# Patient Record
Sex: Male | Born: 1975 | Race: White | Hispanic: No | Marital: Single | Smoking: Current every day smoker
Health system: Southern US, Community
[De-identification: ages and names within clinical notes are randomized; demographics above are authoritative.]

## PROBLEM LIST (undated history)

## (undated) ENCOUNTER — Telehealth

## (undated) ENCOUNTER — Encounter

## (undated) ENCOUNTER — Ambulatory Visit

## (undated) ENCOUNTER — Encounter
Attending: Student in an Organized Health Care Education/Training Program | Primary: Student in an Organized Health Care Education/Training Program

## (undated) ENCOUNTER — Ambulatory Visit: Payer: MEDICARE

## (undated) ENCOUNTER — Encounter: Attending: Psychiatric/Mental Health | Primary: Psychiatric/Mental Health

## (undated) ENCOUNTER — Ambulatory Visit
Attending: Student in an Organized Health Care Education/Training Program | Primary: Student in an Organized Health Care Education/Training Program

## (undated) ENCOUNTER — Telehealth: Attending: Infectious Disease | Primary: Infectious Disease

## (undated) ENCOUNTER — Telehealth: Attending: Clinical | Primary: Clinical

## (undated) ENCOUNTER — Telehealth
Attending: Student in an Organized Health Care Education/Training Program | Primary: Student in an Organized Health Care Education/Training Program

## (undated) DIAGNOSIS — F419 Anxiety disorder, unspecified: Secondary | ICD-10-CM

## (undated) DIAGNOSIS — F32A Depression, unspecified: Secondary | ICD-10-CM

## (undated) DIAGNOSIS — F39 Unspecified mood [affective] disorder: Secondary | ICD-10-CM

## (undated) DIAGNOSIS — F329 Major depressive disorder, single episode, unspecified: Secondary | ICD-10-CM

## (undated) DIAGNOSIS — J189 Pneumonia, unspecified organism: Secondary | ICD-10-CM

## (undated) DIAGNOSIS — B20 Human immunodeficiency virus [HIV] disease: Secondary | ICD-10-CM

## (undated) HISTORY — PX: BACK SURGERY: SHX140

## (undated) HISTORY — PX: TOTAL HIP ARTHROPLASTY: SHX124

---
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
## 1997-08-27 ENCOUNTER — Emergency Department (HOSPITAL_COMMUNITY): Admission: EM | Admit: 1997-08-27 | Discharge: 1997-08-27 | Payer: Self-pay | Admitting: *Deleted

## 2002-01-24 ENCOUNTER — Other Ambulatory Visit: Admission: RE | Admit: 2002-01-24 | Discharge: 2002-01-24 | Payer: Self-pay | Admitting: Internal Medicine

## 2002-10-18 ENCOUNTER — Emergency Department (HOSPITAL_COMMUNITY): Admission: EM | Admit: 2002-10-18 | Discharge: 2002-10-18 | Payer: Self-pay | Admitting: Emergency Medicine

## 2002-11-10 ENCOUNTER — Emergency Department (HOSPITAL_COMMUNITY): Admission: EM | Admit: 2002-11-10 | Discharge: 2002-11-10 | Payer: Self-pay

## 2002-12-07 ENCOUNTER — Emergency Department (HOSPITAL_COMMUNITY): Admission: EM | Admit: 2002-12-07 | Discharge: 2002-12-07 | Payer: Self-pay | Admitting: Emergency Medicine

## 2004-01-24 ENCOUNTER — Ambulatory Visit: Payer: Self-pay | Admitting: Internal Medicine

## 2004-04-09 ENCOUNTER — Emergency Department: Payer: Self-pay | Admitting: Emergency Medicine

## 2004-04-13 ENCOUNTER — Emergency Department: Payer: Self-pay | Admitting: Emergency Medicine

## 2004-04-15 ENCOUNTER — Emergency Department: Payer: Self-pay | Admitting: Emergency Medicine

## 2004-05-08 ENCOUNTER — Emergency Department: Payer: Self-pay | Admitting: Internal Medicine

## 2004-05-29 ENCOUNTER — Ambulatory Visit: Payer: Self-pay | Admitting: Infectious Diseases

## 2004-07-09 ENCOUNTER — Emergency Department (HOSPITAL_COMMUNITY): Admission: EM | Admit: 2004-07-09 | Discharge: 2004-07-09 | Payer: Self-pay | Admitting: Emergency Medicine

## 2004-12-15 ENCOUNTER — Emergency Department: Payer: Self-pay | Admitting: Emergency Medicine

## 2005-09-25 ENCOUNTER — Ambulatory Visit: Payer: Self-pay | Admitting: Infectious Diseases

## 2005-09-29 ENCOUNTER — Emergency Department: Payer: Self-pay | Admitting: Emergency Medicine

## 2005-10-29 ENCOUNTER — Ambulatory Visit: Payer: Self-pay | Admitting: Infectious Diseases

## 2005-11-22 ENCOUNTER — Emergency Department: Payer: Self-pay | Admitting: Emergency Medicine

## 2005-12-02 ENCOUNTER — Inpatient Hospital Stay: Payer: Self-pay | Admitting: Internal Medicine

## 2006-01-07 ENCOUNTER — Ambulatory Visit: Payer: Self-pay | Admitting: Infectious Diseases

## 2006-06-16 ENCOUNTER — Emergency Department: Payer: Self-pay | Admitting: Emergency Medicine

## 2006-07-13 ENCOUNTER — Emergency Department: Payer: Self-pay | Admitting: Unknown Physician Specialty

## 2006-08-26 ENCOUNTER — Ambulatory Visit: Payer: Self-pay | Admitting: Internal Medicine

## 2006-10-28 ENCOUNTER — Ambulatory Visit: Payer: Self-pay | Admitting: Infectious Diseases

## 2007-04-07 ENCOUNTER — Ambulatory Visit: Payer: Self-pay | Admitting: Infectious Diseases

## 2007-05-26 ENCOUNTER — Ambulatory Visit: Payer: Self-pay | Admitting: Infectious Diseases

## 2007-05-29 ENCOUNTER — Emergency Department: Payer: Self-pay | Admitting: Emergency Medicine

## 2007-06-30 ENCOUNTER — Emergency Department: Payer: Self-pay | Admitting: Emergency Medicine

## 2007-07-22 ENCOUNTER — Ambulatory Visit: Payer: Self-pay | Admitting: Internal Medicine

## 2007-09-08 ENCOUNTER — Ambulatory Visit: Payer: Self-pay | Admitting: Infectious Diseases

## 2008-03-11 ENCOUNTER — Emergency Department: Payer: Self-pay | Admitting: Emergency Medicine

## 2008-06-14 ENCOUNTER — Ambulatory Visit: Payer: Self-pay | Admitting: Internal Medicine

## 2008-10-30 ENCOUNTER — Ambulatory Visit (HOSPITAL_COMMUNITY): Admission: RE | Admit: 2008-10-30 | Discharge: 2008-10-30 | Payer: Self-pay | Admitting: Neurosurgery

## 2010-03-27 ENCOUNTER — Ambulatory Visit: Payer: Self-pay | Admitting: Infectious Diseases

## 2010-08-25 LAB — CBC
Hemoglobin: 16.4 g/dL (ref 13.0–17.0)
MCHC: 35.3 g/dL (ref 30.0–36.0)
RBC: 4.97 MIL/uL (ref 4.22–5.81)
WBC: 8.4 10*3/uL (ref 4.0–10.5)

## 2010-08-25 LAB — BASIC METABOLIC PANEL
CO2: 28 mEq/L (ref 19–32)
Calcium: 9.7 mg/dL (ref 8.4–10.5)
Creatinine, Ser: 0.91 mg/dL (ref 0.4–1.5)
GFR calc Af Amer: >60 mL/min (ref >60)
GFR calc non Af Amer: >60 mL/min (ref >60)
Sodium: 136 mEq/L (ref 135–145)

## 2010-08-25 LAB — TYPE AND SCREEN: Antibody Screen: NEGATIVE

## 2010-08-25 LAB — DIFFERENTIAL
Lymphocytes Relative: 36 % (ref 12–46)
Lymphs Abs: 3 10*3/uL (ref 0.7–4.0)
Monocytes Absolute: 0.6 10*3/uL (ref 0.1–1.0)
Monocytes Relative: 7 % (ref 3–12)
Neutro Abs: 4.7 10*3/uL (ref 1.7–7.7)
Neutrophils Relative %: 55 % (ref 43–77)

## 2010-08-25 LAB — ABO/RH: ABO/RH(D): A POS

## 2010-09-18 ENCOUNTER — Emergency Department: Payer: Self-pay | Admitting: Emergency Medicine

## 2010-09-24 ENCOUNTER — Emergency Department: Payer: Self-pay | Admitting: Unknown Physician Specialty

## 2010-09-27 ENCOUNTER — Emergency Department: Payer: Self-pay | Admitting: Emergency Medicine

## 2010-09-30 NOTE — Op Note (Signed)
Mason Meadows, Mason Meadows           ACCOUNT NO.:  [id number]   MEDICAL RECORD NO.:  [id number]          PATIENT TYPE:  OIB   LOCATION:  3535                         FACILITY:  [facility]   PHYSICIAN:  Henry A. Pool, M.D.    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   PROCEDURE NAME:  Left L4-5 laminotomy and microdiskectomy.   SURGEON:  Kathaleen Maser. Pool, MD   ASSISTANT:  Reinaldo Meeker, MD   ANESTHESIA:  General endotracheal.   INDICATIONS:  [name] is a 35 year old male with history of back and  left lower extremity pain failing conservative management.  Workup  demonstrates evidence of spondylosis, epidural lipomatosis, and a left-  sided L4-5 disk herniation, all causing moderately severe stenosis and  compression of left-sided L5 nerve root.  The patient presents now for  decompression and microdiskectomy in hopes of improving his symptoms.   OPERATIVE NOTE:  The patient was taken to the operating room and placed  on the operating table in a supine position.  After adequate level of  anesthesia was achieved, the patient was positioned prone onto Wilson  frame and appropriately padded.  The patient's lumbar region was  prepped and draped sterilely.  A 10-blade was used to make a curvilinear  skin incision overlying the L4-L5 interspace.  This was carried down  sharply in the midline.  A subperiosteal dissection was then performed  exposing the lamina and facet joints at L4 and L5.  Deep self-retaining  retractor was placed.  Intraoperative x-rays were taken and level was  confirmed.  Laminotomy was then performed using high-speed drill and  Kerrison rongeurs to remove the inferior aspect of the lamina of  L4,  medial aspect of L4-L5 facet joints, superior rim of the L5 lamina.  Ligament flavum was then elevated and resected in a piecemeal fashion  using Kerrison rongeurs.  Copious amounts of dorsal epidural fat were  encountered and resected.  Microscope was then brought to the field for  microdissection of the right side L5 nerve root underlying disk  herniation.  Epidural venous plexus coagulated and cut.  Thecal sac and  L5 nerve root were gently mobilized and retracted towards midline.  Disk  herniation was readily apparent.  This was then incised with a 15-blade.  The disk fragment was dissected free using a blunt nerve hook.  The  remainder of the disk space was flat without evidence of major  disruption.  For that reason, no intradiskal diskectomy was undertaken.  The wound was then irrigated with  antibiotic solution.  Gelfoam was placed topically for hemostasis, which  was found to be good.  Wound was then closed in typical fashion.  Steri-  Strips and sterile dressings were applied.  There were no apparent  complications.  The patient tolerated the procedure well, and he returns  to the recovery room postoperatively.  ______________________________  Kathaleen Maser Pool, M.D.     HAP/MEDQ  D:  10/30/2008  T:  10/31/2008  Job:  [id number]

## 2010-10-08 ENCOUNTER — Emergency Department: Payer: Self-pay | Admitting: Unknown Physician Specialty

## 2010-10-12 ENCOUNTER — Emergency Department: Payer: Self-pay | Admitting: Emergency Medicine

## 2010-10-28 ENCOUNTER — Emergency Department: Payer: Self-pay | Admitting: Emergency Medicine

## 2010-11-23 ENCOUNTER — Emergency Department: Payer: Self-pay | Admitting: Emergency Medicine

## 2010-12-13 ENCOUNTER — Emergency Department: Payer: Self-pay | Admitting: Internal Medicine

## 2010-12-19 ENCOUNTER — Emergency Department: Payer: Self-pay | Admitting: Emergency Medicine

## 2010-12-23 ENCOUNTER — Emergency Department: Payer: Self-pay | Admitting: Emergency Medicine

## 2011-01-01 ENCOUNTER — Ambulatory Visit: Payer: Self-pay | Admitting: Orthopedic Surgery

## 2011-01-06 ENCOUNTER — Inpatient Hospital Stay: Payer: Self-pay | Admitting: Orthopedic Surgery

## 2011-01-09 LAB — PATHOLOGY REPORT

## 2011-01-22 ENCOUNTER — Emergency Department: Payer: Self-pay | Admitting: Internal Medicine

## 2011-01-25 ENCOUNTER — Emergency Department: Payer: Self-pay | Admitting: Emergency Medicine

## 2011-01-27 ENCOUNTER — Emergency Department: Payer: Self-pay

## 2011-02-01 ENCOUNTER — Emergency Department: Payer: Self-pay | Admitting: *Deleted

## 2011-02-05 ENCOUNTER — Emergency Department: Payer: Self-pay | Admitting: Emergency Medicine

## 2011-02-15 ENCOUNTER — Emergency Department: Payer: Self-pay | Admitting: Internal Medicine

## 2011-02-20 ENCOUNTER — Emergency Department: Payer: Self-pay | Admitting: *Deleted

## 2011-02-23 ENCOUNTER — Emergency Department: Payer: Self-pay | Admitting: Emergency Medicine

## 2011-03-01 ENCOUNTER — Emergency Department: Payer: Self-pay | Admitting: Emergency Medicine

## 2011-03-05 ENCOUNTER — Emergency Department: Payer: Self-pay | Admitting: Emergency Medicine

## 2011-03-18 ENCOUNTER — Emergency Department: Payer: Self-pay | Admitting: Emergency Medicine

## 2011-04-08 ENCOUNTER — Emergency Department: Payer: Self-pay | Admitting: *Deleted

## 2011-04-09 ENCOUNTER — Emergency Department: Payer: Self-pay | Admitting: Emergency Medicine

## 2011-04-09 ENCOUNTER — Emergency Department: Payer: Self-pay | Admitting: *Deleted

## 2011-04-10 ENCOUNTER — Emergency Department: Payer: Self-pay | Admitting: Emergency Medicine

## 2011-04-28 ENCOUNTER — Ambulatory Visit: Payer: Self-pay | Admitting: Specialist

## 2011-05-29 ENCOUNTER — Emergency Department: Payer: Self-pay | Admitting: Emergency Medicine

## 2011-06-11 ENCOUNTER — Emergency Department: Payer: Self-pay | Admitting: Unknown Physician Specialty

## 2011-06-24 ENCOUNTER — Emergency Department: Payer: Self-pay | Admitting: Unknown Physician Specialty

## 2011-07-01 ENCOUNTER — Emergency Department: Payer: Self-pay | Admitting: Emergency Medicine

## 2011-07-01 LAB — URINALYSIS, COMPLETE
Bacteria: NONE SEEN
Bilirubin,UR: NEGATIVE
Ketone: NEGATIVE
Ph: 7 (ref 4.5–8.0)
Protein: NEGATIVE
RBC,UR: 12 /HPF (ref 0–5)
Specific Gravity: 1.005 (ref 1.003–1.030)
Squamous Epithelial: <1
WBC UR: 52 /HPF (ref 0–5)

## 2011-07-30 ENCOUNTER — Emergency Department: Payer: Self-pay | Admitting: Emergency Medicine

## 2011-07-30 LAB — URINALYSIS, COMPLETE
Bilirubin,UR: NEGATIVE
Blood: NEGATIVE
Glucose,UR: NEGATIVE mg/dL (ref 0–75)
Hyaline Cast: 1
Nitrite: NEGATIVE
Protein: NEGATIVE
Specific Gravity: 1.019 (ref 1.003–1.030)
Squamous Epithelial: 3

## 2011-07-31 ENCOUNTER — Emergency Department: Payer: Self-pay | Admitting: Emergency Medicine

## 2011-12-22 ENCOUNTER — Emergency Department: Payer: Self-pay | Admitting: Emergency Medicine

## 2012-01-24 ENCOUNTER — Emergency Department: Payer: Self-pay | Admitting: Emergency Medicine

## 2012-10-24 ENCOUNTER — Emergency Department: Payer: Self-pay | Admitting: Emergency Medicine

## 2013-04-11 ENCOUNTER — Emergency Department: Payer: Self-pay | Admitting: Emergency Medicine

## 2013-05-30 ENCOUNTER — Emergency Department: Payer: Self-pay | Admitting: Emergency Medicine

## 2013-05-30 LAB — CBC
HCT: 45.6 % (ref 40.0–52.0)
HGB: 15.5 g/dL (ref 13.0–18.0)
MCH: 29.1 pg (ref 26.0–34.0)
MCHC: 34 g/dL (ref 32.0–36.0)
MCV: 86 fL (ref 80–100)
Platelet: 421 10*3/uL (ref 150–440)
RBC: 5.33 10*6/uL (ref 4.40–5.90)
RDW: 13.8 % (ref 11.5–14.5)
WBC: 11.1 10*3/uL — AB (ref 3.8–10.6)

## 2013-05-30 LAB — BASIC METABOLIC PANEL
Anion Gap: 11 (ref 7–16)
BUN: 14 mg/dL (ref 7–18)
CALCIUM: 8.4 mg/dL — AB (ref 8.5–10.1)
Chloride: 101 mmol/L (ref 98–107)
Co2: 20 mmol/L — ABNORMAL LOW (ref 21–32)
Creatinine: 0.86 mg/dL (ref 0.60–1.30)
EGFR (African American): >60
EGFR (Non-African Amer.): >60
Glucose: 87 mg/dL (ref 65–99)
Osmolality: 264 (ref 275–301)
Potassium: 4 mmol/L (ref 3.5–5.1)
Sodium: 132 mmol/L — ABNORMAL LOW (ref 136–145)

## 2013-05-30 LAB — RAPID INFLUENZA A&B ANTIGENS

## 2013-05-31 ENCOUNTER — Inpatient Hospital Stay (HOSPITAL_COMMUNITY)
Admit: 2013-05-31 | Discharge: 2013-06-02 | DRG: 974 | Disposition: A | Discharge status: Other Acute Inpatient Hospital | Payer: Medicare Other | Source: Other Acute Inpatient Hospital | Attending: Internal Medicine | Admitting: Internal Medicine

## 2013-05-31 ENCOUNTER — Encounter (HOSPITAL_COMMUNITY): Payer: Self-pay | Admitting: *Deleted

## 2013-05-31 DIAGNOSIS — Z87891 Personal history of nicotine dependence: Secondary | ICD-10-CM

## 2013-05-31 DIAGNOSIS — Z23 Encounter for immunization: Secondary | ICD-10-CM

## 2013-05-31 DIAGNOSIS — F410 Panic disorder [episodic paroxysmal anxiety] without agoraphobia: Secondary | ICD-10-CM | POA: Diagnosis present

## 2013-05-31 DIAGNOSIS — Z91199 Patient's noncompliance with other medical treatment and regimen due to unspecified reason: Secondary | ICD-10-CM

## 2013-05-31 DIAGNOSIS — Z9119 Patient's noncompliance with other medical treatment and regimen: Secondary | ICD-10-CM

## 2013-05-31 DIAGNOSIS — J690 Pneumonitis due to inhalation of food and vomit: Secondary | ICD-10-CM | POA: Diagnosis present

## 2013-05-31 DIAGNOSIS — B59 Pneumocystosis: Secondary | ICD-10-CM | POA: Diagnosis present

## 2013-05-31 DIAGNOSIS — J9600 Acute respiratory failure, unspecified whether with hypoxia or hypercapnia: Secondary | ICD-10-CM | POA: Diagnosis present

## 2013-05-31 DIAGNOSIS — Z96649 Presence of unspecified artificial hip joint: Secondary | ICD-10-CM

## 2013-05-31 DIAGNOSIS — E43 Unspecified severe protein-calorie malnutrition: Secondary | ICD-10-CM | POA: Diagnosis present

## 2013-05-31 DIAGNOSIS — IMO0002 Reserved for concepts with insufficient information to code with codable children: Secondary | ICD-10-CM

## 2013-05-31 DIAGNOSIS — B20 Human immunodeficiency virus [HIV] disease: Principal | ICD-10-CM | POA: Diagnosis present

## 2013-05-31 DIAGNOSIS — J9601 Acute respiratory failure with hypoxia: Secondary | ICD-10-CM | POA: Diagnosis present

## 2013-05-31 DIAGNOSIS — J189 Pneumonia, unspecified organism: Secondary | ICD-10-CM | POA: Diagnosis present

## 2013-05-31 HISTORY — DX: Anxiety disorder, unspecified: F41.9

## 2013-05-31 HISTORY — DX: Pneumonia, unspecified organism: J18.9

## 2013-05-31 LAB — EXPECTORATED SPUTUM ASSESSMENT W GRAM STAIN, RFLX TO RESP C

## 2013-05-31 LAB — BLOOD GAS, ARTERIAL
Acid-base deficit: 3.3 mmol/L — ABNORMAL HIGH (ref 0.0–2.0)
Bicarbonate: 20.2 mEq/L (ref 20.0–24.0)
Drawn by: 325261
FIO2: 1 %
O2 Saturation: 95.7 %
PCO2 ART: 30.3 mmHg — AB (ref 35.0–45.0)
PO2 ART: 82.9 mmHg (ref 80.0–100.0)
Patient temperature: 98.6
TCO2: 21.1 mmol/L (ref 0–100)
pH, Arterial: 7.438 (ref 7.350–7.450)

## 2013-05-31 LAB — PNEUMOCYSTIS JIROVECI SMEAR BY DFA: Pneumocystis jiroveci Ag: NEGATIVE

## 2013-05-31 LAB — CBC
HCT: 38.8 % — ABNORMAL LOW (ref 39.0–52.0)
Hemoglobin: 13.9 g/dL (ref 13.0–17.0)
MCH: 30 pg (ref 26.0–34.0)
MCHC: 35.8 g/dL (ref 30.0–36.0)
MCV: 83.6 fL (ref 78.0–100.0)
PLATELETS: 364 10*3/uL (ref 150–400)
RBC: 4.64 MIL/uL (ref 4.22–5.81)
RDW: 13.3 % (ref 11.5–15.5)
WBC: 6 10*3/uL (ref 4.0–10.5)

## 2013-05-31 LAB — CREATININE, SERUM
Creatinine, Ser: 0.73 mg/dL (ref 0.50–1.35)
GFR calc Af Amer: >90 mL/min (ref >90)

## 2013-05-31 LAB — MRSA PCR SCREENING: MRSA by PCR: NEGATIVE

## 2013-05-31 MED ORDER — SODIUM CHLORIDE 0.45 % IV SOLN
INTRAVENOUS | Status: DC
  Administered 2013-05-31 – 2013-06-02 (×4): via INTRAVENOUS

## 2013-05-31 MED ORDER — LEVOFLOXACIN IN D5W 750 MG/150ML IV SOLN
750.0000 mg | INTRAVENOUS | Status: DC
  Administered 2013-05-31 – 2013-06-01 (×2): 750 mg via INTRAVENOUS
  Filled 2013-05-31 (×3): qty 150

## 2013-05-31 MED ORDER — ONDANSETRON HCL 4 MG/2ML IJ SOLN: 4.0000 mg | Freq: Four times a day (QID) | INTRAMUSCULAR | Status: DC | PRN

## 2013-05-31 MED ORDER — ONDANSETRON HCL 4 MG PO TABS: 4.0000 mg | ORAL_TABLET | Freq: Four times a day (QID) | ORAL | Status: DC | PRN

## 2013-05-31 MED ORDER — SULFAMETHOXAZOLE-TRIMETHOPRIM 400-80 MG/5ML IV SOLN
160.0000 mg | Freq: Three times a day (TID) | INTRAVENOUS | Status: DC
  Administered 2013-05-31: 160 mg via INTRAVENOUS
  Filled 2013-05-31 (×2): qty 10

## 2013-05-31 MED ORDER — IPRATROPIUM-ALBUTEROL 0.5-2.5 (3) MG/3ML IN SOLN
3.0000 mL | Freq: Four times a day (QID) | RESPIRATORY_TRACT | Status: DC
  Administered 2013-05-31 (×3): 3 mL via RESPIRATORY_TRACT
  Filled 2013-05-31 (×3): qty 3

## 2013-05-31 MED ORDER — SULFAMETHOXAZOLE-TRIMETHOPRIM 400-80 MG/5ML IV SOLN
320.0000 mg | Freq: Three times a day (TID) | INTRAVENOUS | Status: DC
  Administered 2013-05-31 – 2013-06-02 (×6): 320 mg via INTRAVENOUS
  Filled 2013-05-31 (×16): qty 20

## 2013-05-31 MED ORDER — ALBUTEROL SULFATE (2.5 MG/3ML) 0.083% IN NEBU: 2.5000 mg | INHALATION_SOLUTION | Freq: Four times a day (QID) | RESPIRATORY_TRACT | Status: DC

## 2013-05-31 MED ORDER — MORPHINE SULFATE 2 MG/ML IJ SOLN
2.0000 mg | INTRAMUSCULAR | Status: DC | PRN
  Administered 2013-05-31 – 2013-06-01 (×6): 2 mg via INTRAVENOUS
  Filled 2013-05-31 (×6): qty 1

## 2013-05-31 MED ORDER — IPRATROPIUM BROMIDE 0.02 % IN SOLN: 0.5000 mg | Freq: Four times a day (QID) | RESPIRATORY_TRACT | Status: DC

## 2013-05-31 MED ORDER — ZOLPIDEM TARTRATE 5 MG PO TABS: 5.0000 mg | ORAL_TABLET | Freq: Every evening | ORAL | Status: DC | PRN

## 2013-05-31 MED ORDER — ACETAMINOPHEN 325 MG PO TABS: 650.0000 mg | ORAL_TABLET | Freq: Four times a day (QID) | ORAL | Status: DC | PRN

## 2013-05-31 MED ORDER — MORPHINE SULFATE 2 MG/ML IJ SOLN
1.0000 mg | INTRAMUSCULAR | Status: AC
  Administered 2013-05-31: 1 mg via INTRAVENOUS
  Filled 2013-05-31: qty 1

## 2013-05-31 MED ORDER — BOOST / RESOURCE BREEZE PO LIQD
1.0000 | Freq: Two times a day (BID) | ORAL | Status: DC
  Administered 2013-05-31 – 2013-06-02 (×3): 1 via ORAL

## 2013-05-31 MED ORDER — PNEUMOCOCCAL VAC POLYVALENT 25 MCG/0.5ML IJ INJ
0.5000 mL | INJECTION | INTRAMUSCULAR | Status: DC
  Filled 2013-05-31: qty 0.5

## 2013-05-31 MED ORDER — SODIUM CHLORIDE 0.9 % IJ SOLN
3.0000 mL | Freq: Two times a day (BID) | INTRAMUSCULAR | Status: DC
  Administered 2013-05-31 – 2013-06-02 (×4): 3 mL via INTRAVENOUS

## 2013-05-31 MED ORDER — IPRATROPIUM-ALBUTEROL 0.5-2.5 (3) MG/3ML IN SOLN
3.0000 mL | RESPIRATORY_TRACT | Status: AC
  Administered 2013-05-31: 3 mL via RESPIRATORY_TRACT
  Filled 2013-05-31: qty 3

## 2013-05-31 MED ORDER — OXYCODONE HCL 5 MG PO TABS
5.0000 mg | ORAL_TABLET | ORAL | Status: DC | PRN
  Administered 2013-05-31: 5 mg via ORAL
  Filled 2013-05-31: qty 1

## 2013-05-31 MED ORDER — ACETAMINOPHEN 650 MG RE SUPP: 650.0000 mg | Freq: Four times a day (QID) | RECTAL | Status: DC | PRN

## 2013-05-31 MED ORDER — ENOXAPARIN SODIUM 40 MG/0.4ML ~~LOC~~ SOLN
40.0000 mg | Freq: Every day | SUBCUTANEOUS | Status: DC
  Administered 2013-06-02: 40 mg via SUBCUTANEOUS
  Filled 2013-05-31 (×3): qty 0.4

## 2013-05-31 MED ORDER — METHYLPREDNISOLONE SODIUM SUCC 125 MG IJ SOLR
60.0000 mg | Freq: Four times a day (QID) | INTRAMUSCULAR | Status: DC
  Administered 2013-05-31 – 2013-06-02 (×10): 60 mg via INTRAVENOUS
  Filled 2013-05-31 (×5): qty 0.96
  Filled 2013-05-31: qty 2
  Filled 2013-05-31 (×3): qty 0.96
  Filled 2013-05-31: qty 2
  Filled 2013-05-31 (×5): qty 0.96

## 2013-05-31 MED ORDER — ALBUTEROL SULFATE (2.5 MG/3ML) 0.083% IN NEBU: 2.5000 mg | INHALATION_SOLUTION | RESPIRATORY_TRACT | Status: DC | PRN

## 2013-05-31 MED ORDER — INFLUENZA VAC SPLIT QUAD 0.5 ML IM SUSP
0.5000 mL | INTRAMUSCULAR | Status: DC
  Filled 2013-05-31: qty 0.5

## 2013-05-31 NOTE — Progress Notes (Signed)
Utilization Review Completed.  

## 2013-05-31 NOTE — Progress Notes (Signed)
Pt with complaints of SOB and respirations of 27. Paged Craige CottaKirby. Pt to get 1mg  of morphine and to continue to monitor pt. Pt has oxygen sat of 95% on non-re breather. Pt also to get solumedrol. Respiratory at bedside to give breathing treatment. rn will continue to monitor pt.

## 2013-05-31 NOTE — Consult Note (Signed)
[place] for Infectious Disease  Total days of antibiotics 1        Day 1 bactrim        Day 1 levo               Reason for Consult: pcp pna and poorly controlled hiv   Referring Physician: mc clung  Principal Problem:   Aspiration pneumonia Active Problems:   HIV disease   Pneumonia    HPI: Mason Meadows is a 38 y.o. male with hx of long standing HIV, off of HAART for the last 2.5 years, previously taking truvada-boosted atazanavir, who presents at [facility] for dyspnea, increased work of breathing and nonproductive cough with fevers  x 4 wks. He also states that he had some some pleuritic chest pain as well. He reports taking over the counter medications that worked occasionally. Sought care due to worsening dyspnea. Initial work up showed he was found to have bilateral pneumonia with atypical features and severe hypoxemia. His ABGs showed a PO2 of 61 and PCO2 of 26 100% nonrebreather mask.  His flu test was negative. He was started on bactrim and steroids for presumed PCP as well as levofloxacin. The patient states that he choose to stop taking HIV medications, currently on disability for back pain. Lives with several housemates. No sick contacts.  Pmhx: hiv Hx of L4-L5 microdiskectomy Hx of hip replacement  Allergies:  Allergies  Allergen Reactions  . Amoxicillin Hives  . Ketorolac Hives  . Penicillins Hives  . Tramadol     Seizures    MEDICATIONS: . enoxaparin (LOVENOX) injection  40 mg Subcutaneous Daily  . feeding supplement (RESOURCE BREEZE)  1 Container Oral BID BM  . ipratropium-albuterol  3 mL Nebulization Q6H  . levofloxacin (LEVAQUIN) IV  750 mg Intravenous Q24H  . methylPREDNISolone (SOLU-MEDROL) injection  60 mg Intravenous Q6H  . sodium chloride  3 mL Intravenous Q12H  . sulfamethoxazole-trimethoprim  320 mg Intravenous Q8H    History  Substance Use Topics  . Smoking status: Not on file  . Smokeless tobacco: Not on file  . Alcohol Use:  Not on file    No family history on file.  Review of Systems  Constitutional: positive for fever, chills, diaphoresis, activity change, appetite change, fatigue and unexpected weight change.  HENT: Negative for congestion, sore throat, rhinorrhea, sneezing, trouble swallowing and sinus pressure.  Eyes: Negative for photophobia and visual disturbance.  Respiratory: positive for cough,shortness of breath, but negative for wheezing and stridor.  Cardiovascular: Negative for chest pain, palpitations and leg swelling.  Gastrointestinal: Negative for nausea, vomiting, abdominal pain, diarrhea, constipation, blood in stool, abdominal distention and anal bleeding.  Genitourinary: Negative for dysuria, hematuria, flank pain and difficulty urinating.  Musculoskeletal: Negative for myalgias, back pain, joint swelling, arthralgias and gait problem.  Skin: Negative for color change, pallor, rash and wound.  Neurological: Negative for dizziness, tremors, weakness and light-headedness.  Hematological: Negative for adenopathy. Does not bruise/bleed easily.  Psychiatric/Behavioral: Negative for behavioral problems, confusion, sleep disturbance, dysphoric mood, decreased concentration and agitation.     OBJECTIVE: Temp:  [97.2 F (36.2 C)-97.5 F (36.4 C)] 97.4 F (36.3 C) (01/14 0736) Pulse Rate:  [75-92] 75 (01/14 0736) Resp:  [42-64] 64 (01/14 0736) BP: (104-113)/(66-76) 104/76 mmHg (01/14 0736) SpO2:  [90 %-99 %] 99 % (01/14 0806) FiO2 (%):  [100 %] 100 % (01/14 0806) Weight:  [157 lb 4.8 oz (71.351 kg)] 157 lb 4.8 oz (71.351 kg) (01/14 0100)  Constitutional: He is oriented to person, place, and time. Emaciated male in mild respiratory distress only able to speak 4-5 words before taking another breath. HENT:  Mouth/Throat: Oropharynx is clear and moist. No oropharyngeal exudate. No thrush Cardiovascular: Normal rate, regular rhythm and normal heart sounds. Exam reveals no gallop and no friction  rub.  No murmur heard.  Pulmonary/Chest:tachypnic, mild respiratory distress. Acc. Muscle use. He has no wheezes.  Abdominal: Soft. Bowel sounds are normal. He exhibits no distension. There is no tenderness.  Lymphadenopathy:  no cervical adenopathy.  Neurological: He is alert and oriented to person, place, and time.  Skin: Skin is warm and dry. No rash noted. No erythema.  Psychiatric: He has a normal mood and affect. His behavior is normal.    LABS: Results for orders placed during the hospital encounter of 05/31/13 (from the past 48 hour(s))  MRSA PCR SCREENING     Status: None   Collection Time    05/31/13  1:00 AM      Result Value Range   MRSA by PCR NEGATIVE  NEGATIVE   Comment:            The GeneXpert MRSA Assay (FDA     approved for NASAL specimens     only), is one component of a     comprehensive MRSA colonization     surveillance program. It is not     intended to diagnose MRSA     infection nor to guide or     monitor treatment for     MRSA infections.  CBC     Status: Abnormal   Collection Time    05/31/13  3:48 AM      Result Value Range   WBC 6.0  4.0 - 10.5 K/uL   RBC 4.64  4.22 - 5.81 MIL/uL   Hemoglobin 13.9  13.0 - 17.0 g/dL   HCT 38.8 (*) 39.0 - 52.0 %   MCV 83.6  78.0 - 100.0 fL   MCH 30.0  26.0 - 34.0 pg   MCHC 35.8  30.0 - 36.0 g/dL   RDW 13.3  11.5 - 15.5 %   Platelets 364  150 - 400 K/uL  CREATININE, SERUM     Status: None   Collection Time    05/31/13  3:48 AM      Result Value Range   Creatinine, Ser 0.73  0.50 - 1.35 mg/dL   GFR calc non Af Amer >90  >90 mL/min   GFR calc Af Amer >90  >90 mL/min   Comment: (NOTE)     The eGFR has been calculated using the CKD EPI equation.     This calculation has not been validated in all clinical situations.     eGFR's persistently <90 mL/min signify possible Chronic Kidney     Disease.  BLOOD GAS, ARTERIAL     Status: Abnormal   Collection Time    05/31/13  8:00 AM      Result Value Range   FIO2  1.00     Delivery systems OXYGEN MASK     pH, Arterial 7.438  7.350 - 7.450   pCO2 arterial 30.3 (*) 35.0 - 45.0 mmHg   pO2, Arterial 82.9  80.0 - 100.0 mmHg   Bicarbonate 20.2  20.0 - 24.0 mEq/L   TCO2 21.1  0 - 100 mmol/L   Acid-base deficit 3.3 (*) 0.0 - 2.0 mmol/L   O2 Saturation 95.7     Patient temperature 98.6  Collection site RIGHT RADIAL     Drawn by [phone]     Sample type ARTERIAL DRAW     Allens test (pass/fail) PASS  PASS    MICRO: 1/14 blood cx pending 1/14 sputum cx pending pjp stain pending hiv vl and cd 4 count pending  IMAGING: No results found.   Assessment/Plan:  38yo M with poorly controlled HIV disease presents with clinical pictures c/w with PJP. PJ Pneumonia: - agree with primary team to treat for PJP with steroids and bactrim. Await stain. Check ldh - continue with levofloxacin for CAP coverage as well  -  hiv disease/AIDS = recommended to restart his old regimen, the patient would like to think about it for an additional day before restarting hiv medications. Discussed the importance to restart meds. Await cd 4 count and hiv viral load  Malnutrition = once able to have better breathing status, recommend calorie count to see if he needs supplementation to his diet  [facility] [place] [phone]

## 2013-05-31 NOTE — H&P (Addendum)
[facility]                                                                                    Patient Demographics  Mason Meadows, is a 38 y.o. male  CSN: [id number]  MRN: [id number]  DOB - [birth year]  Admit Date - 05/31/2013  Outpatient Primary MD for the patient is MASOUD,JAVED, MD   With History of -  Past medical history significant for 1. HIV 2. lower back pain 3. Noncompliance  Past surgical history significant for  Lower back surgery Total hip replacement   HPI  Mason Meadows  is a 38 y.o. male, HIV positive was not compliant with treatment presenting today with a 4 weeks history of increasing shortness of breath and nonproductive cough with fever. Reports some pleuritic chest pain as well. Patient went to [facility] where he was found to have bilateral pneumonia with atypical features and severe hypoxemia. His ABGs showed a PO2 of 61 and PCO2 of 26 100% nonrebreather mask. The patient was sent to our hospital for further management . His flu test was negative.    Review of Systems    In addition to the HPI above,  No Headache, No changes with Vision or hearing, No problems swallowing food or Liquids, No Abdominal pain, No Nausea or Vommitting, Bowel movements are regular, No Blood in stool or Urine, No dysuria, No new skin rashes or bruises, No new joints pains-aches,  No new weakness, tingling, numbness in any extremity, No recent weight gain or loss, No polyuria, polydypsia or polyphagia, No significant Mental Stressors.  A full 10 point Review of Systems was done, except as stated above, all other Review of Systems were negative.   Social History Patient has history of smoking no history of alcoholism or drug abuse  Family History Not pertinent to current admission  Prior to Admission medications   None     Allergies  Allergen Reactions  . Ketorolac Hives  . Penicillins Hives  . Tramadol     Seizures     Physical Exam  Vitals  Blood pressure 113/66, pulse 92, temperature 97.2 F (36.2 C), temperature source Axillary, resp. rate 42, height 5\' 10"  (1.778 m), weight 71.351 kg (157 lb 4.8 oz), SpO2 94.00%.   1. General Young white American male in moderate respiratory distress, using accessory muscles of breathing  2. Normal affect and insight, Not Suicidal or Homicidal, Awake Alert, Oriented X 3.  3. No F.N deficits, ALL C.Nerves Intact, Strength 5/5 all 4 extremities, Sensation intact all 4 extremities, Plantars down going.  4. Ears and Eyes appear Normal, Conjunctivae clear, PERRLA. Moist Oral Mucosa.  5. Supple Neck, No JVD, No cervical lymphadenopathy appriciated, No Carotid Bruits.  6. Symmetrical Chest wall movement, Good air movement bilaterally, CTAB.  7. RRR, No Gallops, Rubs or Murmurs, No Parasternal Heave.  8. Positive Bowel Sounds, Abdomen Soft, Non tender, No organomegaly appriciated,No rebound -guarding or rigidity.  9.  No Cyanosis, Normal Skin Turgor, No Skin Rash or Bruise.  10. Good muscle tone,  joints appear normal , no effusions, Normal ROM.  11. No Palpable Lymph Nodes in Neck or Axillae  Data Review WBC Ct count 11.1 hemoglobin 15.5 platelets 421 Sodium 132 Potassium 4.0 Chloride 101 CO2 20 BUN 14 Creatinine 0.86 Blood sugar 80 Chest x-ray shows bilateral pneumonia with atypical features ABGs PO2 61 PCO2 26 100%  nonrebreather mask  Assessment & Plan  1. Bilateral pneumonia with severe hypoxemia in a patient with HIV     Probably PCP pneumonia, LDH is elevated     Bactrim/prednisone started     Levaquin started     Consult ID     Keep on high oxygen     Check cultures 2. HIV     Noncompliant     Check CD4 count/viral load   DVT Prophylaxis Lovenox  AM Labs Ordered, also please review Full Orders  Family Communication: Admission, patients condition and plan of care including tests being ordered have been discussed with the  patient who indicates understanding and agrees with the plan and Code Status.  Code Status full  Disposition Plan: Home  Time spent in minutes : 34 minutes  Condition critical

## 2013-05-31 NOTE — Progress Notes (Signed)
INITIAL NUTRITION ASSESSMENT  DOCUMENTATION CODES Per approved criteria  -Severe malnutrition in the context of chronic illness   INTERVENTION: Resource Breeze po BID, each supplement provides 250 kcal and 9 grams of protein RD to follow for nutrition care plan  NUTRITION DIAGNOSIS: Increased nutrient needs related to catabolic illness as evidenced by estimated nutrition needs  Goal: Pt to meet >/= 90% of their estimated nutrition needs   Monitor:  PO & supplemental intake, weight, labs, I/O's  Reason for Assessment: Malnutrition Screening Tool Report  38 y.o. male  Admitting Dx: Aspiration pneumonia  ASSESSMENT: Patient with PMH of HIV; was not compliant with treatment and presented [facility] with 4 weeks history of increasing SOB and nonproductive cough with fever; found to have bilateral PNA with atypical features and severe hypoxemia; [facility].  Patient with non-rebreather mask on upon RD visit; reports his appetite has been poor x 1 month; states he was only consuming 1 meal per day and some days he would only have 1 food item such as a banana; reports he's had severe weight loss of ~ 20 lbs in this time frame; doesn't care for Ensure supplements, however, he is willing to try Resource Breeze -- RD to order.  Patient meets criteria for severe malnutrition in the context of chronic illness as evidenced by < 75% intake of estimated energy requirement for > 1 month and 10% weight loss x 1 month.  Height: Ht Readings from Last 1 Encounters:  05/31/13 5\' 10"  (1.778 m)    Weight: Wt Readings from Last 1 Encounters:  05/31/13 157 lb 4.8 oz (71.351 kg)    Ideal Body Weight: 166 lb  % Ideal Body Weight: 94%  Wt Readings from Last 10 Encounters:  05/31/13 157 lb 4.8 oz (71.351 kg)    Usual Body Weight: 175 lb  % Usual Body Weight: 90%  BMI:  Body mass index is 22.57 kg/(m^2).  Estimated Nutritional Needs: Kcal:  2150-2350 Protein: 105-115 gm Fluid: 2.1-2.3 L  Skin: Intact  Diet Order: General  EDUCATION NEEDS: -No education needs identified at this time   Intake/Output Summary (Last 24 hours) at 05/31/13 1047 Last data filed at 05/31/13 0600  Gross per 24 hour  Intake      0 ml  Output    400 ml  Net   -400 ml   Labs:   Recent Labs Lab 05/31/13 0348  CREATININE 0.73    Scheduled Meds: . enoxaparin (LOVENOX) injection  40 mg Subcutaneous Daily  . ipratropium-albuterol  3 mL Nebulization Q6H  . levofloxacin (LEVAQUIN) IV  750 mg Intravenous Q24H  . methylPREDNISolone (SOLU-MEDROL) injection  60 mg Intravenous Q6H  . sodium chloride  3 mL Intravenous Q12H  . sulfamethoxazole-trimethoprim  320 mg Intravenous Q8H    Continuous Infusions: . sodium chloride Stopped (05/31/13 0318)    No past medical history on file.  No past surgical history on file.  Maureen ChattersKatie Brodie Correll, RD, LDN Pager #: [phone] After-Hours Pager #: [phone]

## 2013-05-31 NOTE — Progress Notes (Signed)
[facility] Progress Note    Mason Meadows WJX:[id number] DOB: 1975-12-10 DOA: 05/31/2013 PCP: Corky Downs, MD  Brief narrative: 38 year old male patient with known HIV/AIDS noncompliant with medications. Initially presented to [facility] with a four-week history of progressive shortness of breath and nonproductive cough with fever associated pleuritic chest pain. Workup at [facility] hospital revealed bilateral pneumonia with atypical features and severe hypoxemia. Patient was requiring high percent oxygen to a nonrebreather mask an ABG at that time revealed a PO2 of 61. 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 Of note influenza test was negative.  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 -Continue Bactrim Levaquin and prednisone -supportive care with oxygen -Encourage mobilization and pulmonary toilet    AIDS -Consult infectious disease service -CD4 count/viral load pending -If not yet established patient needs to establish with an outpatient HIV clinic    Severe protein-calorie malnutrition -Consult nutrition  DVT prophylaxis: Lovenox Code Status: Full Family Communication: No family at the bedside Disposition Plan/Expected LOS: Transfer to floor   Consultants: Infectious Disease  Procedures: None  Antibiotics: Levaquin 1/14 >>> Bactrim 1/14 >>>  HPI/Subjective: Patient alert and clearly short of breath but states improved as compared to pre-admission. Still requiring 100% nonrebreather mask.  Objective: Blood pressure 116/72, pulse 93, temperature 97.4 F (36.3 C), temperature source Axillary, resp. rate 27, height 5\' 10"  (1.778 m), weight 157 lb 4.8 oz (71.351 kg), SpO2 96.00%.  Intake/Output Summary (Last 24 hours) at 05/31/13 1404 Last data filed at 05/31/13 1229  Gross per 24 hour  Intake      0 ml  Output    700 ml  Net   -700 ml   Exam: Follow  up exam completed. Patient admitted earlier today.  Scheduled Meds:  Scheduled Meds: . enoxaparin (LOVENOX) injection  40 mg Subcutaneous Daily  . feeding supplement (RESOURCE BREEZE)  1 Container Oral BID BM  . ipratropium-albuterol  3 mL Nebulization Q6H  . levofloxacin (LEVAQUIN) IV  750 mg Intravenous Q24H  . methylPREDNISolone (SOLU-MEDROL) injection  60 mg Intravenous Q6H  . sodium chloride  3 mL Intravenous Q12H  . sulfamethoxazole-trimethoprim  320 mg Intravenous Q8H   Continuous Infusions: . sodium chloride Stopped (05/31/13 0318)    **Reviewed in detail by the Attending Physician  Data Reviewed: Basic Metabolic Panel:  Recent Labs Lab 05/31/13 0348  CREATININE 0.73   Liver Function Tests: No results found for this basename: AST, ALT, ALKPHOS, BILITOT, PROT, ALBUMIN,  in the last 168 hours No results found for this basename: LIPASE, AMYLASE,  in the last 168 hours No results found for this basename: AMMONIA,  in the last 168 hours CBC:  Recent Labs Lab 05/31/13 0348  WBC 6.0  HGB 13.9  HCT 38.8*  MCV 83.6  PLT 364   Cardiac Enzymes: No results found for this basename: CKTOTAL, CKMB, CKMBINDEX, TROPONINI,  in the last 168 hours BNP (last 3 results) No results found for this basename: PROBNP,  in the last 8760 hours CBG: No results found for this basename: GLUCAP,  in the last 168 hours  Recent Results (from the past 240 hour(s))  MRSA PCR SCREENING     Status: None   Collection Time    05/31/13  1:00 AM      Result Value Range Status   MRSA by PCR NEGATIVE  NEGATIVE Final   Comment:            The GeneXpert  MRSA Assay (FDA     approved for NASAL specimens     only), is one component of a     comprehensive MRSA colonization     surveillance program. It is not     intended to diagnose MRSA     infection nor to guide or     monitor treatment for     MRSA infections.  CULTURE, EXPECTORATED SPUTUM-ASSESSMENT     Status: None   Collection Time     05/31/13  9:15 AM      Result Value Range Status   Specimen Description SPUTUM   Final   Special Requests NONE   Final   Sputum evaluation     Final   Value: MICROSCOPIC FINDINGS SUGGEST THAT THIS SPECIMEN IS NOT REPRESENTATIVE OF LOWER RESPIRATORY SECRETIONS. PLEASE RECOLLECT.     CALLED TO Phineas RealM LESSARD,RN 05/31/13 1220 BY K SCHULTZ   Report Status 05/31/2013 FINAL   Final     Studies:  Recent x-ray studies have been reviewed in detail by the Attending Physician  Time spent :     Junious SilkAllison Ellis, ANP [facility] Office  [phone] Pager [phone]  **If unable to reach the above provider after paging please contact the Flow Manager @ [phone]  On-Call/Text Page:      [email or web address] [email or web address]      password [email or web address]  If 7PM-7AM, please contact night-coverage [email or web address] Password [email or web address] 05/31/2013, 2:04 PM   LOS: 0 days   I have personally examined this patient and reviewed the entire database. I have reviewed the above note, made any necessary editorial changes, and agree with its content.  Lonia BloodJeffrey T. Aneyah Lortz, MD [facility]

## 2013-06-01 ENCOUNTER — Inpatient Hospital Stay (HOSPITAL_COMMUNITY): Payer: Medicare Other

## 2013-06-01 DIAGNOSIS — J9600 Acute respiratory failure, unspecified whether with hypoxia or hypercapnia: Secondary | ICD-10-CM

## 2013-06-01 LAB — BASIC METABOLIC PANEL
BUN: 8 mg/dL (ref 6–23)
CALCIUM: 8.1 mg/dL — AB (ref 8.4–10.5)
CO2: 18 mEq/L — ABNORMAL LOW (ref 19–32)
CREATININE: 0.61 mg/dL (ref 0.50–1.35)
Chloride: 100 mEq/L (ref 96–112)
Glucose, Bld: 179 mg/dL — ABNORMAL HIGH (ref 70–99)
Potassium: 3.8 mEq/L (ref 3.7–5.3)
SODIUM: 135 meq/L — AB (ref 137–147)

## 2013-06-01 LAB — CBC
HCT: 40.3 % (ref 39.0–52.0)
Hemoglobin: 13.9 g/dL (ref 13.0–17.0)
MCH: 29.7 pg (ref 26.0–34.0)
MCHC: 34.5 g/dL (ref 30.0–36.0)
MCV: 86.1 fL (ref 78.0–100.0)
PLATELETS: 404 10*3/uL — AB (ref 150–400)
RBC: 4.68 MIL/uL (ref 4.22–5.81)
RDW: 13.3 % (ref 11.5–15.5)
WBC: 15.1 10*3/uL — ABNORMAL HIGH (ref 4.0–10.5)

## 2013-06-01 LAB — LACTATE DEHYDROGENASE: LDH: 677 U/L — AB (ref 94–250)

## 2013-06-01 LAB — HIV-1 RNA QUANT-NO REFLEX-BLD
HIV 1 RNA QUANT: 29400 {copies}/mL — AB (ref <20)
HIV-1 RNA QUANT, LOG: 4.47 {Log} — AB (ref <1.30)

## 2013-06-01 LAB — STREP PNEUMONIAE URINARY ANTIGEN: STREP PNEUMO URINARY ANTIGEN: NEGATIVE

## 2013-06-01 MED ORDER — GUAIFENESIN ER 600 MG PO TB12
1200.0000 mg | ORAL_TABLET | Freq: Two times a day (BID) | ORAL | Status: DC
  Administered 2013-06-01 – 2013-06-02 (×3): 1200 mg via ORAL
  Filled 2013-06-01 (×4): qty 2

## 2013-06-01 MED ORDER — IPRATROPIUM-ALBUTEROL 0.5-2.5 (3) MG/3ML IN SOLN
3.0000 mL | Freq: Three times a day (TID) | RESPIRATORY_TRACT | Status: DC
  Administered 2013-06-01 – 2013-06-02 (×4): 3 mL via RESPIRATORY_TRACT
  Filled 2013-06-01 (×5): qty 3

## 2013-06-01 MED ORDER — OXYCODONE HCL 5 MG PO TABS
5.0000 mg | ORAL_TABLET | ORAL | Status: DC | PRN
  Administered 2013-06-01 – 2013-06-02 (×6): 10 mg via ORAL
  Filled 2013-06-01 (×6): qty 2

## 2013-06-01 MED ORDER — PNEUMOCOCCAL VAC POLYVALENT 25 MCG/0.5ML IJ INJ: 0.5000 mL | INJECTION | INTRAMUSCULAR | Status: DC | PRN

## 2013-06-01 MED ORDER — INFLUENZA VAC SPLIT QUAD 0.5 ML IM SUSP: 0.5000 mL | INTRAMUSCULAR | Status: DC | PRN

## 2013-06-01 NOTE — Care Management Note (Signed)
    Page 1 of 1   06/02/2013     1:59:51 PM   CARE MANAGEMENT NOTE 06/02/2013  Patient:  [facility]   Account Number:  [id number]  Date Initiated:  06/01/2013  Documentation initiated by:  Letha CapeAYLOR,Regginald Pask  Subjective/Objective Assessment:   dx acute resp failure  admit- from home.     Action/Plan:   Anticipated DC Date:  06/02/2013   Anticipated DC Plan:  LONG TERM ACUTE CARE (LTAC)      DC Planning Services  CM consult      Choice offered to / List presented to:             Status of service:  Completed, signed off Medicare Important Message given?   (If response is "NO", the following Medicare IM given date fields will be blank) Date Medicare IM given:   Date Additional Medicare IM given:    Discharge Disposition:  LONG TERM ACUTE CARE (LTAC)  Per UR Regulation:  Reviewed for med. necessity/level of care/duration of stay  If discussed at Long Length of Stay Meetings, dates discussed:    Comments:  06/02/13 11:45 Letha Capeeborah Kayd Launer RN, BSN [phone] per Dr. Drue SecondSnider with ID states the doses of the antivirals are as follows:  Truvada 200/300 qd, Atavanavir 400mg  QD, Rotonavir 100 qd if patient is agreeable in taking these. Dr. Drue SecondSnider also mentioned that one of the patient's iv abx will be discontinued on Sunday and patient will be taking po abx.  Boneta LucksJenny with ltac will get back with NCM to see if patient is still a candidate for ltac.  Pet Boneta LucksJenny with Ltac, they will be able to take patient today, NCM informed MD.  06/01/13 10:56 Letha Capeeborah Melda Mermelstein RN ,BSN [phone] patient is from home , NCM asked ltac reps to see if patient is a candidate for ltac.  Per Boneta LucksJenny with ltac patient looks like will be a candidate for ltac, and Kathlene NovemberMike with [facility] states patient will be a candidate for them as well.  NCM spoke with patient and he states he wants to go to [facility] since it is here in the hospital.  Boneta LucksJenny with [facility] states they may have a bed tomorrow for this patient, she will check with the  [facility] Team to make sure patient is ok to come to [facility] and let me know tomorrow.

## 2013-06-01 NOTE — Progress Notes (Signed)
[facility] Progress Note    [facility] UEA:[id number] DOB: July 24, 1975 DOA: 05/31/2013 PCP: Corky Downs, MD  Brief narrative: 38 year old male patient with known HIV/AIDS off of HAART medications. Initially presented to [facility] with a four-week history of progressive shortness of breath and nonproductive cough with fever associated pleuritic chest pain. Workup at [facility] hospital revealed bilateral pneumonia with atypical features and severe hypoxemia. Patient was requiring high percent oxygen to a nonrebreather mask an ABG at that time revealed a PO2 of 61. 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 Of note influenza test was negative.  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  Appreciate ID consultation -supportive care with oxygen -Encourage mobilization and pulmonary toilet -sputum cs neg for PCJ   AIDS -ID following -CD4 count/viral load pending -ID recommending restart of HIV meds.  Patient considering it.  Severe protein-calorie malnutrition -nutrition consulted.  Appreciate their recommendations.  DVT prophylaxis: Lovenox Code Status: Full Family Communication: patient alert and orientated. Disposition Plan/Expected LOS: inpatient.-?LTACH  Consultants: Infectious Disease  Procedures: None  Antibiotics: Levaquin 1/14 >>> Bactrim 1/14 >>>  HPI/Subjective: Patient complaining of back pain.  States cough has improved somewhat.  Objective: Blood pressure 118/75, pulse 77, temperature 97.7 F (36.5 C), temperature source Oral, resp. rate 20, height 5\' 10"  (1.778 m), weight 71.351 kg (157 lb 4.8 oz), SpO2 95.00%.  Intake/Output Summary (Last 24 hours) at 06/01/13 0900 Last data filed at 06/01/13 0856  Gross per 24 hour   Intake   2180 ml  Output   1450 ml  Net    730 ml   Exam:  General:  38 yo male, thin, flushed, appears acutely ill.  NAD in bed.  On oxygen n/c HEENT:  +exudates in oropharynx, with mild erythema Chest:  Unable to inspire deeply (painful), short inspiration but no w/c/r Abdomen:  Soft, thin, nt, nd, +bs, no masses Ext:  Able to move all 4, 5/5 strength in each Skin:  No rash, bruise or lesions.  Scheduled Meds:  Scheduled Meds: . enoxaparin (LOVENOX) injection  40 mg Subcutaneous Daily  . feeding supplement (RESOURCE BREEZE)  1 Container Oral BID BM  . influenza vac split quadrivalent PF  0.5 mL Intramuscular Tomorrow-1000  . ipratropium-albuterol  3 mL Nebulization TID  . levofloxacin (LEVAQUIN) IV  750 mg Intravenous Q24H  . methylPREDNISolone (SOLU-MEDROL) injection  60 mg Intravenous Q6H  . pneumococcal 23 valent vaccine  0.5 mL Intramuscular Tomorrow-1000  . sodium chloride  3 mL Intravenous Q12H  . sulfamethoxazole-trimethoprim  320 mg Intravenous Q8H   Continuous Infusions: . sodium chloride 75 mL/hr at 06/01/13 0044    Data Reviewed: Basic Metabolic Panel:  Recent Labs Lab 05/31/13 0348 06/01/13 0338  NA  --  135*  K  --  3.8  CL  --  100  CO2  --  18*  GLUCOSE  --  179*  BUN  --  8  CREATININE 0.73 0.61  CALCIUM  --  8.1*   CBC:  Recent Labs Lab 05/31/13 0348 06/01/13 0338  WBC 6.0 15.1*  HGB 13.9 13.9  HCT 38.8* 40.3  MCV 83.6 86.1  PLT 364 404*     Recent Results (from the past 240 hour(s))  MRSA PCR SCREENING     Status: None   Collection Time    05/31/13  1:00 AM      Result Value Range Status   MRSA by PCR NEGATIVE  NEGATIVE Final   Comment:            The GeneXpert MRSA Assay (FDA     approved for NASAL specimens     only), is one component of a     comprehensive MRSA colonization     surveillance program. It is not     intended to diagnose MRSA     infection nor to guide or     monitor treatment for     MRSA infections.   CULTURE, BLOOD (ROUTINE X 2)     Status: None   Collection Time    05/31/13  3:48 AM      Result Value Range Status   Specimen Description BLOOD LEFT ARM   Final   Special Requests BOTTLES DRAWN AEROBIC ONLY 10CC   Final   Culture  Setup Time     Final   Value: 05/31/2013 08:10     Performed at [facility]   Culture     Final   Value:        BLOOD CULTURE RECEIVED NO GROWTH TO DATE CULTURE WILL BE HELD FOR 5 DAYS BEFORE ISSUING A FINAL NEGATIVE REPORT     Performed at [facility]   Report Status PENDING   Incomplete  CULTURE, BLOOD (ROUTINE X 2)     Status: None   Collection Time    05/31/13  3:51 AM      Result Value Range Status   Specimen Description BLOOD LEFT HAND   Final   Special Requests BOTTLES DRAWN AEROBIC ONLY [facility]7CC   Final   Culture  Setup Time     Final   Value: 05/31/2013 08:43     Performed at [facility]   Culture     Final   Value:        BLOOD CULTURE RECEIVED NO GROWTH TO DATE CULTURE WILL BE HELD FOR 5 DAYS BEFORE ISSUING A FINAL NEGATIVE REPORT     Performed at [facility]   Report Status PENDING   Incomplete  CULTURE, EXPECTORATED SPUTUM-ASSESSMENT     Status: None   Collection Time    05/31/13  9:15 AM      Result Value Range Status   Specimen Description SPUTUM   Final   Special Requests NONE   Final   Sputum evaluation     Final   Value: MICROSCOPIC FINDINGS SUGGEST THAT THIS SPECIMEN IS NOT REPRESENTATIVE OF LOWER RESPIRATORY SECRETIONS. PLEASE RECOLLECT.     CALLED TO Phineas RealM LESSARD,RN 05/31/13 1220 BY K SCHULTZ   Report Status 05/31/2013 FINAL   Final  PNEUMOCYSTIS JIROVECI SMEAR BY DFA     Status: None   Collection Time    05/31/13  9:15 AM      Result Value Range Status   Specimen Source-PJSRC SPU   Final   Pneumocystis jiroveci Ag NEGATIVE   Final   Comment: [facility]formed [facility]     Studies:  Recent x-ray studies have been reviewed in detail by the Attending Physician  Time spent :      Romualdo BolkMarianne York, PA-C [facility] Pager: [phone]   If 7PM-7AM, please contact night-coverage [email or web address] Password [email or web address] 06/01/2013, 9:00 AM   LOS: 1 day   Attending Patient seen and examined, agree with the above assessment and plan. Continue with Solumedrol, IV Bactrim and Levaquin.  Titrate off O2 slowly. ?LTACH at discharge.  Windell Norfolk MD

## 2013-06-01 NOTE — Progress Notes (Signed)
PT demonstrates that he understands usage and importance of Flutter usage.

## 2013-06-01 NOTE — Progress Notes (Signed)
Working with pt on flutter valve.  Only did 3 breaths and then said he could do no more it was going to give him a panic attack

## 2013-06-01 NOTE — Progress Notes (Addendum)
Pt with complaints of chest pain 9/10. VS temp-97.6 orally, r-24, p-82, o2-95% on non-rebreather. Paged kirby. Awaiting further orders. Pt given 2 mg of morphine IV. No further orders given. Will continue to monitor pt

## 2013-06-01 NOTE — Progress Notes (Signed)
RT Note: Rt attempted to start chest vest with patient. Patient was asleep and when awakened he asked if we could come back later to do the chest vest with him. He refused to have it done now. Patient is stable and is in no acute respiratory distress. Rt will continue to monitor.

## 2013-06-02 ENCOUNTER — Encounter (HOSPITAL_COMMUNITY): Payer: Self-pay | Admitting: *Deleted

## 2013-06-02 ENCOUNTER — Inpatient Hospital Stay
Admission: AD | Admit: 2013-06-02 | Discharge: 2013-06-23 | Disposition: A | Discharge status: Skilled Nursing Facility | Payer: Self-pay | Source: Ambulatory Visit | Attending: Internal Medicine | Admitting: Internal Medicine

## 2013-06-02 DIAGNOSIS — J189 Pneumonia, unspecified organism: Secondary | ICD-10-CM

## 2013-06-02 DIAGNOSIS — J9601 Acute respiratory failure with hypoxia: Secondary | ICD-10-CM

## 2013-06-02 LAB — BASIC METABOLIC PANEL
BUN: 11 mg/dL (ref 6–23)
CALCIUM: 8.2 mg/dL — AB (ref 8.4–10.5)
CO2: 19 mEq/L (ref 19–32)
Chloride: 101 mEq/L (ref 96–112)
Creatinine, Ser: 0.76 mg/dL (ref 0.50–1.35)
Glucose, Bld: 125 mg/dL — ABNORMAL HIGH (ref 70–99)
POTASSIUM: 4.4 meq/L (ref 3.7–5.3)
Sodium: 136 mEq/L — ABNORMAL LOW (ref 137–147)

## 2013-06-02 LAB — BLOOD GAS, ARTERIAL
Acid-base deficit: 3.9 mmol/L — ABNORMAL HIGH (ref 0.0–2.0)
Bicarbonate: 19.7 mEq/L — ABNORMAL LOW (ref 20.0–24.0)
FIO2: 0.5 %
O2 Saturation: 98.7 %
Patient temperature: 98.6
TCO2: 20.7 mmol/L (ref 0–100)
pCO2 arterial: 30.7 mmHg — ABNORMAL LOW (ref 35.0–45.0)
pH, Arterial: 7.423 (ref 7.350–7.450)
pO2, Arterial: 126 mmHg — ABNORMAL HIGH (ref 80.0–100.0)

## 2013-06-02 LAB — CBC
HCT: 39.4 % (ref 39.0–52.0)
Hemoglobin: 13.9 g/dL (ref 13.0–17.0)
MCH: 29.9 pg (ref 26.0–34.0)
MCHC: 35.3 g/dL (ref 30.0–36.0)
MCV: 84.7 fL (ref 78.0–100.0)
Platelets: 408 10*3/uL — ABNORMAL HIGH (ref 150–400)
RBC: 4.65 MIL/uL (ref 4.22–5.81)
RDW: 13.4 % (ref 11.5–15.5)
WBC: 16 10*3/uL — ABNORMAL HIGH (ref 4.0–10.5)

## 2013-06-02 LAB — T-HELPER CELLS (CD4) COUNT (NOT AT ARMC)
CD4 % Helper T Cell: 2 % — ABNORMAL LOW (ref 33–55)
CD4 T Cell Abs: 10 /uL — ABNORMAL LOW (ref 400–2700)

## 2013-06-02 LAB — LEGIONELLA ANTIGEN, URINE: Legionella Antigen, Urine: NEGATIVE

## 2013-06-02 LAB — LACTATE DEHYDROGENASE: LDH: 632 U/L — ABNORMAL HIGH (ref 94–250)

## 2013-06-02 MED ORDER — ENOXAPARIN SODIUM 40 MG/0.4ML ~~LOC~~ SOLN: 40.0000 mg | Freq: Every day | SUBCUTANEOUS | Status: DC

## 2013-06-02 MED ORDER — GUAIFENESIN ER 600 MG PO TB12: 1200.0000 mg | ORAL_TABLET | Freq: Two times a day (BID) | ORAL | Status: DC

## 2013-06-02 MED ORDER — AZITHROMYCIN 600 MG PO TABS
1200.0000 mg | ORAL_TABLET | ORAL | Status: DC
  Administered 2013-06-02: 1200 mg via ORAL
  Filled 2013-06-02: qty 2

## 2013-06-02 MED ORDER — ZOLPIDEM TARTRATE 5 MG PO TABS: 5.0000 mg | ORAL_TABLET | Freq: Every evening | ORAL | Status: DC | PRN

## 2013-06-02 MED ORDER — ALBUTEROL SULFATE (2.5 MG/3ML) 0.083% IN NEBU: 2.5000 mg | INHALATION_SOLUTION | RESPIRATORY_TRACT | Status: DC | PRN

## 2013-06-02 MED ORDER — OXYCODONE HCL 5 MG PO TABS: 5.0000 mg | ORAL_TABLET | ORAL | Status: DC | PRN

## 2013-06-02 MED ORDER — LORAZEPAM 1 MG PO TABS: 1.0000 mg | ORAL_TABLET | Freq: Four times a day (QID) | ORAL | Status: DC | PRN

## 2013-06-02 MED ORDER — LORAZEPAM 1 MG PO TABS
1.0000 mg | ORAL_TABLET | Freq: Four times a day (QID) | ORAL | Status: DC | PRN
  Administered 2013-06-02: 1 mg via ORAL
  Filled 2013-06-02: qty 1

## 2013-06-02 MED ORDER — LEVOFLOXACIN IN D5W 750 MG/150ML IV SOLN: 750.0000 mg | INTRAVENOUS | Status: DC

## 2013-06-02 MED ORDER — ONDANSETRON HCL 4 MG PO TABS: 4.0000 mg | ORAL_TABLET | Freq: Four times a day (QID) | ORAL | Status: DC | PRN

## 2013-06-02 MED ORDER — AZITHROMYCIN 600 MG PO TABS: 1200.0000 mg | ORAL_TABLET | ORAL | Status: DC

## 2013-06-02 MED ORDER — OXYMETAZOLINE HCL 0.05 % NA SOLN: 1.0000 | Freq: Two times a day (BID) | NASAL | Status: DC | PRN

## 2013-06-02 MED ORDER — ACETAMINOPHEN 325 MG PO TABS: 650.0000 mg | ORAL_TABLET | Freq: Four times a day (QID) | ORAL | Status: DC | PRN

## 2013-06-02 MED ORDER — METHYLPREDNISOLONE SODIUM SUCC 125 MG IJ SOLR: 60.0000 mg | Freq: Four times a day (QID) | INTRAMUSCULAR | Status: DC

## 2013-06-02 MED ORDER — IPRATROPIUM-ALBUTEROL 0.5-2.5 (3) MG/3ML IN SOLN: 3.0000 mL | Freq: Three times a day (TID) | RESPIRATORY_TRACT | Status: DC

## 2013-06-02 MED ORDER — BOOST / RESOURCE BREEZE PO LIQD: 1.0000 | Freq: Two times a day (BID) | ORAL | Status: DC

## 2013-06-02 MED ORDER — SODIUM CHLORIDE 0.45 % IV SOLN: 100.0000 mL | INTRAVENOUS | Status: DC

## 2013-06-02 MED ORDER — SULFAMETHOXAZOLE-TRIMETHOPRIM 400-80 MG/5ML IV SOLN: 320.0000 mg | Freq: Three times a day (TID) | INTRAVENOUS | Status: DC

## 2013-06-02 NOTE — Progress Notes (Signed)
[facility] Progress Note    BERTRUM HELMSTETTER ZOX:[id number] DOB: December 23, 1975 DOA: 05/31/2013 PCP: Corky Downs, MD  Brief narrative: 38 year old male patient with known HIV/AIDS off of HAART medications. Initially presented to [facility] with a four-week history of progressive shortness of breath and nonproductive cough with fever associated pleuritic chest pain. Workup at [facility] hospital revealed bilateral pneumonia with atypical features and severe hypoxemia. Patient was requiring high percent oxygen to a nonrebreather mask an ABG at that time revealed a PO2 of 61. 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 Of note influenza test was negative.  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  Appreciate ID consultation -supportive care with oxygen -Encourage mobilization and pulmonary toilet -sputum cs neg for PCJ   AIDS -ID following -CD4 count 10 -ID recommending restart of HIV meds.  Patient considering it.  Severe protein-calorie malnutrition -nutrition consulted.  Appreciate their recommendations.  DVT prophylaxis: Lovenox Code Status: Full Family Communication: patient alert and orientated. Disposition Plan/Expected LOS: inpatient.-?LTACH  Consultants: Infectious Disease  Procedures: None  Antibiotics: Levaquin 1/14 >>> Bactrim 1/14 >>>  HPI/Subjective: States he feels better, still on Ventimask  Objective: Blood pressure 113/78, pulse 83, temperature 97.5 F (36.4 C), temperature source Oral, resp. rate 20, height 5\' 10"  (1.778 m), weight 71.351 kg (157 lb 4.8 oz), SpO2 92.00%.  Intake/Output Summary (Last 24 hours) at 06/02/13 1339 Last data filed at 06/02/13 8119  Gross per 24 hour   Intake 3347.5 ml  Output   3350 ml  Net   -2.5 ml   Exam:  General:  38 yo male, thin, flushed, appears acutely ill.  NAD in bed.  On oxygen n/c HEENT:  +exudates in oropharynx, with mild erythema Chest:  Unable to inspire deeply (painful), short inspiration but no w/c/r Abdomen:  Soft, thin, nt, nd, +bs, no masses Ext:  Able to move all 4, 5/5 strength in each Skin:  No rash, bruise or lesions.  Scheduled Meds:  Scheduled Meds: . azithromycin  1,200 mg Oral Q Fri  . enoxaparin (LOVENOX) injection  40 mg Subcutaneous Daily  . feeding supplement (RESOURCE BREEZE)  1 Container Oral BID BM  . guaiFENesin  1,200 mg Oral BID  . ipratropium-albuterol  3 mL Nebulization TID  . levofloxacin (LEVAQUIN) IV  750 mg Intravenous Q24H  . methylPREDNISolone (SOLU-MEDROL) injection  60 mg Intravenous Q6H  . sodium chloride  3 mL Intravenous Q12H  . sulfamethoxazole-trimethoprim  320 mg Intravenous Q8H   Continuous Infusions: . sodium chloride 75 mL/hr at 06/02/13 1159    Data Reviewed: Basic Metabolic Panel:  Recent Labs Lab 05/31/13 0348 06/01/13 0338 06/02/13 0540  NA  --  135* 136*  K  --  3.8 4.4  CL  --  100 101  CO2  --  18* 19  GLUCOSE  --  179* 125*  BUN  --  8 11  CREATININE 0.73 0.61 0.76  CALCIUM  --  8.1* 8.2*   CBC:  Recent Labs Lab 05/31/13 0348 06/01/13 0338 06/02/13 0540  WBC 6.0 15.1* 16.0*  HGB 13.9 13.9 13.9  HCT 38.8* 40.3 39.4  MCV 83.6 86.1 84.7  PLT 364 404* 408*     Recent Results (from the past 240 hour(s))  MRSA PCR SCREENING     Status: None  Collection Time    05/31/13  1:00 AM      Result Value Range Status   MRSA by PCR NEGATIVE  NEGATIVE Final   Comment:            The GeneXpert MRSA Assay (FDA     approved for NASAL specimens     only), is one component of a     comprehensive MRSA colonization     surveillance program. It is not     intended to diagnose MRSA     infection nor to guide or     monitor treatment for     MRSA  infections.  CULTURE, BLOOD (ROUTINE X 2)     Status: None   Collection Time    05/31/13  3:48 AM      Result Value Range Status   Specimen Description BLOOD LEFT ARM   Final   Special Requests BOTTLES DRAWN AEROBIC ONLY 10CC   Final   Culture  Setup Time     Final   Value: 05/31/2013 08:10     Performed at [facility]   Culture     Final   Value:        BLOOD CULTURE RECEIVED NO GROWTH TO DATE CULTURE WILL BE HELD FOR 5 DAYS BEFORE ISSUING A FINAL NEGATIVE REPORT     Performed at [facility]   Report Status PENDING   Incomplete  CULTURE, BLOOD (ROUTINE X 2)     Status: None   Collection Time    05/31/13  3:51 AM      Result Value Range Status   Specimen Description BLOOD LEFT HAND   Final   Special Requests BOTTLES DRAWN AEROBIC ONLY [facility]7CC   Final   Culture  Setup Time     Final   Value: 05/31/2013 08:43     Performed at [facility]   Culture     Final   Value:        BLOOD CULTURE RECEIVED NO GROWTH TO DATE CULTURE WILL BE HELD FOR 5 DAYS BEFORE ISSUING A FINAL NEGATIVE REPORT     Performed at [facility]   Report Status PENDING   Incomplete  CULTURE, EXPECTORATED SPUTUM-ASSESSMENT     Status: None   Collection Time    05/31/13  9:15 AM      Result Value Range Status   Specimen Description SPUTUM   Final   Special Requests NONE   Final   Sputum evaluation     Final   Value: MICROSCOPIC FINDINGS SUGGEST THAT THIS SPECIMEN IS NOT REPRESENTATIVE OF LOWER RESPIRATORY SECRETIONS. PLEASE RECOLLECT.     CALLED TO Phineas RealM LESSARD,RN 05/31/13 1220 BY K SCHULTZ   Report Status 05/31/2013 FINAL   Final  PNEUMOCYSTIS JIROVECI SMEAR BY DFA     Status: None   Collection Time    05/31/13  9:15 AM      Result Value Range Status   Specimen Source-PJSRC SPU   Final   Pneumocystis jiroveci Ag NEGATIVE   Final   Comment: Performed at [facility]     Studies:  Time spent :25 minutes  Windell NorfolkS Ghimire MD [facility] Pager: [phone]  If  7PM-7AM, please contact night-coverage [email or web address] Password [email or web address] 06/02/2013, 1:39 PM   LOS: 2 days

## 2013-06-02 NOTE — Progress Notes (Signed)
06/02/13 Patient transferring to 5710 after supper.

## 2013-06-02 NOTE — Progress Notes (Addendum)
[facility]    Date of Admission:  05/31/2013   Total days of antibiotics 4        Day 4 bactrim        Day 3 levo           ID: Mason Meadows is a 38 y.o. male with AIDS, cd 4 count of 10/VL 29,400 off of ART for 2.51yrs presented with hypoxia/ARF cxr and clinical presentation consistent with PCP.   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 Not yet ready to start HIV meds now but likely in a few days "gotta get my head wrapped around it"  Medications:  . enoxaparin (LOVENOX) injection  40 mg Subcutaneous Daily  . feeding supplement (RESOURCE BREEZE)  1 Container Oral BID BM  . guaiFENesin  1,200 mg Oral BID  . ipratropium-albuterol  3 mL Nebulization TID  . levofloxacin (LEVAQUIN) IV  750 mg Intravenous Q24H  . methylPREDNISolone (SOLU-MEDROL) injection  60 mg Intravenous Q6H  . sodium chloride  3 mL Intravenous Q12H  . sulfamethoxazole-trimethoprim  320 mg Intravenous Q8H    Objective: Vital signs in last 24 hours: Temp:  [97.4 F (36.3 C)-97.5 F (36.4 C)] 97.5 F (36.4 C) (01/16 9604) Pulse Rate:  [83-100] 83 (01/16 0613) Resp:  [20] 20 (01/16 0613) BP: (113-124)/(73-78) 113/78 mmHg (01/16 0613) SpO2:  [87 %-95 %] 92 % (01/16 0956) FiO2 (%):  [50 %] 50 % (01/16 0956) Physical Exam  Constitutional: He is oriented to person, place, and time. disheveled. No distress. Wearing nebulizer HENT:  Mouth/Throat: Oropharynx is clear and moist. No oropharyngeal exudate.  Cardiovascular: Normal rate, regular rhythm and normal heart sounds. Exam reveals no gallop and no friction rub.  No murmur heard.  Pulmonary/Chest: course rhonchi bilaterally Abdominal: Soft. Bowel sounds are normal. He exhibits no distension. There is no tenderness.    Lymphadenopathy:  He has no cervical adenopathy.  Neurological: He is alert and oriented to person, place, and time.  Skin: Skin is warm and dry. No rash noted. No erythema.  Psychiatric: He has a normal mood and affect. His behavior is normal.   Lab Results  Recent Labs  06/01/13 0338 06/02/13 0540  WBC 15.1* 16.0*  HGB 13.9 13.9  HCT 40.3 39.4  NA 135* 136*  K 3.8 4.4  CL 100 101  CO2 18* 19  BUN 8 11  CREATININE 0.61 0.76    Microbiology: 1/14 blood cx NGTD  Studies/Results: Dg Chest Port 1 View  06/01/2013   CLINICAL DATA:  Shortness of breath  EXAM: PORTABLE CHEST - 1 VIEW  COMPARISON:  10/26/2008  FINDINGS: Heart size upper normal. Moderate to severe hazy opacity overlies both lungs diffusely, most prominent in the mid to lower lung zones.  IMPRESSION: Diffuse bilateral airspace opacification. Acute pulmonary edema could have this appearance. Other possibilities include pulmonary hemorrhage, pneumonia, and pneumonitis.   Electronically Signed   By: [facility] M.D.   On: 06/01/2013 08:02     Assessment/Plan: 38yo M with AIDS, cd 4 count of 10/ vl 29,400 presents with respiratory failure clinically consistent with PCP  1) AIDs/HIV disease, poorly controlled= awaiting genotype. Would recommend that he restarts his previous regimen of truvada 1 tab daily, atazanavir 400mg  1 tab daily, and ritonavir 100mg  daily  when he is ready to commit to taking medications daily  2) oi proph = will need to start taking azithromycin 1200mg  po weekly, will give first weekly dose today. After he finishes pcp treatment, he will need to continue on bactrim SS daily.  3) presumed PCP pneumonia = once able to tolerate meds by mouth, he can be transitioned from IV bactrim to oral bactrim DS 2 tabs Q 8hr to treat for a total of 21 days. Currently on day #4. Steroids can be transitioned to orals once improved to 40mg  BID x  5days, 40mg  daily x 5 days, then 20mg  daily, to finish once he  finishes bactrim treatment doses  4) CAP = he receives last dose on Sunday 1/18.  Can refer to [facility] for HIV care or Dr. Yehuda [facility]ave [facility]   [facility], [facility] [employer] [facility] Cell: [phone] Pager: [phone]  06/02/2013, 11:05 AM

## 2013-06-02 NOTE — Progress Notes (Signed)
06/02/13 Patient to be discharged today after supper,IV sites to be removed and discharge discussed with patient.

## 2013-06-02 NOTE — Discharge Summary (Signed)
Physician Discharge Summary  Mason Meadows ZOX:[id number] DOB: Mar 18, 1976 DOA: 05/31/2013  PCP: Mason Downs, MD  Admit date: 05/31/2013 Discharge date: 06/02/2013  Time spent: 45 minutes  Recommendations for Outpatient Follow-up:  1. Infectious Disease (Dr. Drue Meadows) to follow at [facility]. 2. 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  Diet recommendation: heart healthy with protein supplements  Filed Weights   05/31/13 0100  Weight: 71.351 kg (157 lb 4.8 oz)    Brief narrative:  38 year old male patient with known HIV/AIDS off of HAART medications. Initially presented to [facility] with a four-week history of progressive shortness of breath and nonproductive cough with fever associated pleuritic chest pain. Workup at [facility] revealed bilateral pneumonia with atypical features and severe hypoxemia. Patient was requiring high percent oxygen to a nonrebreather mask an ABG at that time revealed a PO2 of 61. 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 Of note influenza test was negative.  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 Appreciate ID consultation -see below for ID recommendations -supportive care with oxygen  -Encourage mobilization and pulmonary toilet  -sputum cs neg for PCJ   AIDS  -ID following  -CD4 count 10  -ID recommending restart of HIV meds. Patient considering it.   Per ID would not restart HIV meds for at least 1 week.    Recommendations from infectious Disease, Dr. Judyann Meadows:  Are as follows  1) AIDs/HIV disease, poorly controlled= awaiting genotype. Would recommend that he restarts his previous regimen of truvada 1 tab daily, atazanavir 400mg  1 tab daily, and ritonavir 100mg  daily when he is ready to commit to taking medications daily   2) oi proph = will need to start taking azithromycin 1200mg  po weekly, will give first weekly dose today. After he finishes pcp treatment, he will need to continue on bactrim SS daily.   3) presumed PCP pneumonia = once able to tolerate meds by mouth, he can be transitioned from IV bactrim to oral bactrim DS 2 tabs Q 8hr to treat for a total of 21 days. Currently on day #4. Steroids can be transitioned to orals once improved to 40mg  BID x 5days, 40mg  daily x 5 days, then 20mg  daily, to finish once he finishes bactrim treatment doses   4) CAP = he receives last dose on Sunday 1/18.  Can refer to [facility] for HIV care or Dr. Yehuda Meadows at [place] [facility] at [facility].   Severe protein-calorie malnutrition  -nutrition consulted. Appreciate their recommendations.  -Resource Breeze BID  Situational Anxiety attacks. Brought on by SOB PRN ativan.  Code Status: Full  Family Communication: patient alert and orientated.  Disposition Plan/Expected LOS: Transfer to [facility] on 06/02/13   Consultants:  Infectious Disease  Antibiotics:  Levaquin 1/14 >>>  Bactrim 1/14 >>>    Discharge Exam: Filed Vitals:   06/02/13 0613  BP: 113/78  Pulse: 83  Temp: 97.5 F (36.4 C)  Resp: 20   General: 38 yo male, thin, flushed, appears acutely ill. NAD in bed. On oxygen n/c  HEENT: +exudates in oropharynx,  with mild erythema  Chest: Unable to inspire deeply (painful), short inspiration but no w/c/r  Abdomen: Soft, thin, nt, nd, +bs, no masses  Ext: Able to move all 4, 5/5 strength in each  Skin: No rash, bruise or lesions.    Discharge Instructions      Discharge Orders    Future Orders Complete By Expires   Diet - low sodium heart healthy  As directed    Increase activity slowly  As directed        Medication List    STOP taking these medications       TYLENOL PM EXTRA STRENGTH PO      TAKE these medications       acetaminophen 325 MG tablet  Commonly known as:  TYLENOL  Take 2 tablets (650 mg total) by mouth every 6 (six) hours as needed for mild pain (or Fever >/= 101).     albuterol (2.5 MG/3ML) 0.083% nebulizer solution  Commonly known as:  PROVENTIL  Take 3 mLs (2.5 mg total) by nebulization every 2 (two) hours as needed for wheezing.     azithromycin 600 MG tablet  Commonly known as:  ZITHROMAX  Take 2 tablets (1,200 mg total) by mouth once a week.     dextrose 5 % SOLN 500 mL with sulfamethoxazole-trimethoprim 400-80 MG/5ML SOLN 320 mg  Inject 320 mg into the vein every 8 (eight) hours.     enoxaparin 40 MG/0.4ML injection  Commonly known as:  LOVENOX  Inject 0.4 mLs (40 mg total) into the skin daily.     feeding supplement (RESOURCE BREEZE) Liqd  Take 1 Container by mouth 2 (two) times daily between meals.     guaiFENesin 600 MG 12 hr tablet  Commonly known as:  MUCINEX  Take 2 tablets (1,200 mg total) by mouth 2 (two) times daily.     ipratropium-albuterol 0.5-2.5 (3) MG/3ML Soln  Commonly known as:  DUONEB  Take 3 mLs by nebulization 3 (three) times daily.     levofloxacin 750 MG/150ML Soln  Commonly known as:  LEVAQUIN  Inject 150 mLs (750 mg total) into the vein daily.     LORazepam 1 MG tablet  Commonly known as:  ATIVAN  Take 1 tablet (1 mg total) by mouth every 6 (six) hours as needed for anxiety.     methylPREDNISolone sodium succinate 125 mg/2 mL injection  Commonly known as:  SOLU-MEDROL  Inject 0.96 mLs (60 mg total) into the vein every 6 (six) hours.     ondansetron 4 MG tablet  Commonly known as:  ZOFRAN  Take 1 tablet (4 mg total) by mouth every 6 (six) hours as needed for nausea.     oxyCODONE 5 MG  immediate release tablet  Commonly known as:  Oxy IR/ROXICODONE  Take 1-2 tablets (5-10 mg total) by mouth every 4 (four) hours as needed for moderate pain.     sodium chloride 0.45 % solution  Inject 100 mLs into the vein continuous.     zolpidem 5 MG tablet  Commonly known as:  AMBIEN  Take 1 tablet (5 mg total) by mouth at bedtime as needed for sleep (insomnia).       Allergies  Allergen Reactions  . Amoxicillin Hives  . Ketorolac Hives  . Penicillins Hives  . Tramadol     Seizures      The results of significant diagnostics from this hospitalization (including imaging, microbiology, ancillary and laboratory) are listed below for reference.  Significant Diagnostic Studies: Dg Chest Port 1 View  06/01/2013   CLINICAL DATA:  Shortness of breath  EXAM: PORTABLE CHEST - 1 VIEW  COMPARISON:  10/26/2008  FINDINGS: Heart size upper normal. Moderate to severe hazy opacity overlies both lungs diffusely, most prominent in the mid to lower lung zones.  IMPRESSION: Diffuse bilateral airspace opacification. Acute pulmonary edema could have this appearance. Other possibilities include pulmonary hemorrhage, pneumonia, and pneumonitis.   Electronically Signed   By: Esperanza Heir M.D.   On: 06/01/2013 08:02    Microbiology: Recent Results (from the past 240 hour(s))  MRSA PCR SCREENING     Status: None   Collection Time    05/31/13  1:00 AM      Result Value Range Status   MRSA by PCR NEGATIVE  NEGATIVE Final   Comment:            The GeneXpert MRSA Assay (FDA     approved for NASAL specimens     only), is one component of a     comprehensive MRSA colonization     surveillance program. It is not     intended to diagnose MRSA     infection nor to guide or     monitor treatment for     MRSA infections.  CULTURE, BLOOD (ROUTINE X 2)     Status: None   Collection Time    05/31/13  3:48 AM      Result Value Range Status   Specimen Description BLOOD LEFT ARM   Final   Special  Requests BOTTLES DRAWN AEROBIC ONLY 10CC   Final   Culture  Setup Time     Final   Value: 05/31/2013 08:10     Performed at [facility]   Culture     Final   Value:        BLOOD CULTURE RECEIVED NO GROWTH TO DATE CULTURE WILL BE HELD FOR 5 DAYS BEFORE ISSUING A FINAL NEGATIVE REPORT     Performed at [facility]   Report Status PENDING   Incomplete  CULTURE, BLOOD (ROUTINE X 2)     Status: None   Collection Time    05/31/13  3:51 AM      Result Value Range Status   Specimen Description BLOOD LEFT HAND   Final   Special Requests BOTTLES DRAWN AEROBIC ONLY [facility]   Final   Culture  Setup Time     Final   Value: 05/31/2013 08:43     Performed at [facility]   Culture     Final   Value:        BLOOD CULTURE RECEIVED NO GROWTH TO DATE CULTURE WILL BE HELD FOR 5 DAYS BEFORE ISSUING A FINAL NEGATIVE REPORT     Performed at [facility]   Report Status PENDING   Incomplete  CULTURE, EXPECTORATED SPUTUM-ASSESSMENT     Status: None   Collection Time    05/31/13  9:15 AM      Result Value Range Status   Specimen Description SPUTUM   Final   Special Requests NONE   Final   Sputum evaluation     Final   Value: MICROSCOPIC FINDINGS SUGGEST THAT THIS SPECIMEN IS NOT REPRESENTATIVE OF LOWER RESPIRATORY SECRETIONS. PLEASE RECOLLECT.     CALLED TO Phineas Real 05/31/13 1220 BY K SCHULTZ   Report Status 05/31/2013 FINAL   Final  PNEUMOCYSTIS JIROVECI SMEAR BY DFA     Status: None  Collection Time    05/31/13  9:15 AM      Result Value Range Status   Specimen Source-PJSRC SPU   Final   Pneumocystis jiroveci Ag NEGATIVE   Final   Comment: [facility]     Labs: Basic Metabolic Panel:  Recent Labs Lab 05/31/13 0348 06/01/13 0338 06/02/13 0540  NA  --  135* 136*  K  --  3.8 4.4  CL  --  100 101  CO2  --  18* 19  GLUCOSE  --  179* 125*  BUN  --  8 11  CREATININE 0.73 0.61 0.76  CALCIUM  --  8.1* 8.2*   CBC:  Recent  Labs Lab 05/31/13 0348 06/01/13 0338 06/02/13 0540  WBC 6.0 15.1* 16.0*  HGB 13.9 13.9 13.9  HCT 38.8* 40.3 39.4  MCV 83.6 86.1 84.7  PLT 364 404* 408*     Signed:  Conley CanalYork, Marianne L, PA-C [phone]  [facility] 06/02/2013, 2:16 PM   Attending Patient seen and examined, slow improvement continues. Agreeable to transfer to [facility]. Continue with IV Solumedrol, Bactrim, Levaquin on discharge. Will need ID follow up [facility]. Consider Pul eval if hypoxia fails to improve.  S Ghimire

## 2013-06-03 LAB — BASIC METABOLIC PANEL
BUN: 16 mg/dL (ref 6–23)
CALCIUM: 8.5 mg/dL (ref 8.4–10.5)
CO2: 22 mEq/L (ref 19–32)
CREATININE: 0.76 mg/dL (ref 0.50–1.35)
Chloride: 104 mEq/L (ref 96–112)
GFR calc Af Amer: >90 mL/min (ref >90)
GFR calc non Af Amer: >90 mL/min (ref >90)
Glucose, Bld: 115 mg/dL — ABNORMAL HIGH (ref 70–99)
Potassium: 5 mEq/L (ref 3.7–5.3)
Sodium: 141 mEq/L (ref 137–147)

## 2013-06-03 LAB — CBC
HCT: 40.8 % (ref 39.0–52.0)
Hemoglobin: 14.2 g/dL (ref 13.0–17.0)
MCH: 29.8 pg (ref 26.0–34.0)
MCHC: 34.8 g/dL (ref 30.0–36.0)
MCV: 85.5 fL (ref 78.0–100.0)
PLATELETS: 401 10*3/uL — AB (ref 150–400)
RBC: 4.77 MIL/uL (ref 4.22–5.81)
RDW: 13.8 % (ref 11.5–15.5)
WBC: 14.3 10*3/uL — ABNORMAL HIGH (ref 4.0–10.5)

## 2013-06-03 LAB — TSH: TSH: 0.943 u[IU]/mL (ref 0.350–4.500)

## 2013-06-03 LAB — PROCALCITONIN: Procalcitonin: <0.1 ng/mL

## 2013-06-04 LAB — HIV-1 RNA QUANT-NO REFLEX-BLD
HIV 1 RNA QUANT: 39827 {copies}/mL — AB (ref <20)
HIV-1 RNA Quant, Log: 4.6 {Log} — ABNORMAL HIGH (ref <1.30)

## 2013-06-04 LAB — CULTURE, BLOOD (SINGLE)

## 2013-06-05 LAB — BASIC METABOLIC PANEL
BUN: 11 mg/dL (ref 6–23)
CO2: 16 mEq/L — ABNORMAL LOW (ref 19–32)
Calcium: 8.1 mg/dL — ABNORMAL LOW (ref 8.4–10.5)
Chloride: 100 mEq/L (ref 96–112)
Creatinine, Ser: 0.75 mg/dL (ref 0.50–1.35)
GFR calc Af Amer: >90 mL/min (ref >90)
GLUCOSE: 149 mg/dL — AB (ref 70–99)
POTASSIUM: 4.6 meq/L (ref 3.7–5.3)
Sodium: 133 mEq/L — ABNORMAL LOW (ref 137–147)

## 2013-06-05 LAB — CBC
HCT: 40.7 % (ref 39.0–52.0)
HEMOGLOBIN: 14.2 g/dL (ref 13.0–17.0)
MCH: 29.6 pg (ref 26.0–34.0)
MCHC: 34.9 g/dL (ref 30.0–36.0)
MCV: 84.8 fL (ref 78.0–100.0)
Platelets: 373 10*3/uL (ref 150–400)
RBC: 4.8 MIL/uL (ref 4.22–5.81)
RDW: 14 % (ref 11.5–15.5)
WBC: 14.1 10*3/uL — ABNORMAL HIGH (ref 4.0–10.5)

## 2013-06-06 ENCOUNTER — Other Ambulatory Visit (HOSPITAL_COMMUNITY): Payer: Self-pay

## 2013-06-06 LAB — BASIC METABOLIC PANEL
BUN: 16 mg/dL (ref 6–23)
CALCIUM: 8.8 mg/dL (ref 8.4–10.5)
CO2: 18 mEq/L — ABNORMAL LOW (ref 19–32)
CREATININE: 0.87 mg/dL (ref 0.50–1.35)
Chloride: 95 mEq/L — ABNORMAL LOW (ref 96–112)
GFR calc Af Amer: >90 mL/min (ref >90)
GFR calc non Af Amer: >90 mL/min (ref >90)
GLUCOSE: 139 mg/dL — AB (ref 70–99)
Potassium: 4.2 mEq/L (ref 3.7–5.3)
Sodium: 133 mEq/L — ABNORMAL LOW (ref 137–147)

## 2013-06-06 LAB — CULTURE, BLOOD (ROUTINE X 2)
Culture: NO GROWTH
Culture: NO GROWTH

## 2013-06-07 ENCOUNTER — Other Ambulatory Visit (HOSPITAL_COMMUNITY): Payer: Self-pay

## 2013-06-07 DIAGNOSIS — I369 Nonrheumatic tricuspid valve disorder, unspecified: Secondary | ICD-10-CM

## 2013-06-07 NOTE — Progress Notes (Signed)
  Echocardiogram 2D Echocardiogram has been performed.  Mason Meadows, Mason Meadows 06/07/2013, 5:48 PM

## 2013-06-10 LAB — HIV-1 INTEGRASE GENOTYPE
DATE VIRAL LOAD COLLECTED: NO GROWTH
Value last viral load: NO GROWTH

## 2013-06-12 ENCOUNTER — Other Ambulatory Visit (HOSPITAL_COMMUNITY): Payer: Self-pay

## 2013-06-12 LAB — BLOOD GAS, ARTERIAL
Acid-Base Excess: 3.8 mmol/L — ABNORMAL HIGH (ref 0.0–2.0)
Bicarbonate: 27.8 mEq/L — ABNORMAL HIGH (ref 20.0–24.0)
FIO2: 0.6 %
O2 Saturation: 98.9 %
PCO2 ART: 42.2 mmHg (ref 35.0–45.0)
PH ART: 7.434 (ref 7.350–7.450)
Patient temperature: 98.6
TCO2: 29.1 mmol/L (ref 0–100)
pO2, Arterial: 122 mmHg — ABNORMAL HIGH (ref 80.0–100.0)

## 2013-06-14 ENCOUNTER — Other Ambulatory Visit (HOSPITAL_COMMUNITY): Payer: Self-pay

## 2013-06-15 LAB — COMPREHENSIVE METABOLIC PANEL
ALBUMIN: 2.5 g/dL — AB (ref 3.5–5.2)
ALT: 25 U/L (ref 0–53)
AST: 22 U/L (ref 0–37)
Alkaline Phosphatase: 54 U/L (ref 39–117)
BUN: 15 mg/dL (ref 6–23)
CALCIUM: 8 mg/dL — AB (ref 8.4–10.5)
CO2: 28 meq/L (ref 19–32)
Chloride: 90 mEq/L — ABNORMAL LOW (ref 96–112)
Creatinine, Ser: 1.03 mg/dL (ref 0.50–1.35)
GFR calc Af Amer: >90 mL/min (ref >90)
GFR calc non Af Amer: >90 mL/min (ref >90)
Glucose, Bld: 79 mg/dL (ref 70–99)
Potassium: 4.4 mEq/L (ref 3.7–5.3)
SODIUM: 130 meq/L — AB (ref 137–147)
Total Bilirubin: 1 mg/dL (ref 0.3–1.2)
Total Protein: 6 g/dL (ref 6.0–8.3)

## 2013-06-15 LAB — URINALYSIS, ROUTINE W REFLEX MICROSCOPIC
Bilirubin Urine: NEGATIVE
GLUCOSE, UA: NEGATIVE mg/dL
KETONES UR: NEGATIVE mg/dL
LEUKOCYTES UA: NEGATIVE
NITRITE: NEGATIVE
Protein, ur: NEGATIVE mg/dL
Specific Gravity, Urine: 1.007 (ref 1.005–1.030)
UROBILINOGEN UA: 1 mg/dL (ref 0.0–1.0)
pH: 6.5 (ref 5.0–8.0)

## 2013-06-15 LAB — CBC WITH DIFFERENTIAL/PLATELET
BASOS ABS: 0 10*3/uL (ref 0.0–0.1)
BASOS PCT: 0 % (ref 0–1)
EOS PCT: 0 % (ref 0–5)
Eosinophils Absolute: 0 10*3/uL (ref 0.0–0.7)
HCT: 39.2 % (ref 39.0–52.0)
Hemoglobin: 13.6 g/dL (ref 13.0–17.0)
LYMPHS PCT: 18 % (ref 12–46)
Lymphs Abs: 1.5 10*3/uL (ref 0.7–4.0)
MCH: 29.8 pg (ref 26.0–34.0)
MCHC: 34.7 g/dL (ref 30.0–36.0)
MCV: 85.8 fL (ref 78.0–100.0)
Monocytes Absolute: 0.3 10*3/uL (ref 0.1–1.0)
Monocytes Relative: 3 % (ref 3–12)
Neutro Abs: 6.9 10*3/uL (ref 1.7–7.7)
Neutrophils Relative %: 79 % — ABNORMAL HIGH (ref 43–77)
Platelets: 191 10*3/uL (ref 150–400)
RBC: 4.57 MIL/uL (ref 4.22–5.81)
RDW: 15.1 % (ref 11.5–15.5)
WBC: 8.6 10*3/uL (ref 4.0–10.5)

## 2013-06-15 LAB — URINE MICROSCOPIC-ADD ON

## 2013-06-16 LAB — RESPIRATORY VIRUS PANEL
Adenovirus: DETECTED — AB
INFLUENZA A H1: NOT DETECTED
Influenza A H3: NOT DETECTED
Influenza A: NOT DETECTED
Influenza B: NOT DETECTED
METAPNEUMOVIRUS: NOT DETECTED
PARAINFLUENZA 1 A: NOT DETECTED
PARAINFLUENZA 2 A: NOT DETECTED
PARAINFLUENZA 3 A: NOT DETECTED
Respiratory Syncytial Virus A: NOT DETECTED
Respiratory Syncytial Virus B: NOT DETECTED
Rhinovirus: DETECTED — AB

## 2013-06-16 LAB — BASIC METABOLIC PANEL
BUN: 15 mg/dL (ref 6–23)
CHLORIDE: 96 meq/L (ref 96–112)
CO2: 25 mEq/L (ref 19–32)
CREATININE: 0.77 mg/dL (ref 0.50–1.35)
Calcium: 8.1 mg/dL — ABNORMAL LOW (ref 8.4–10.5)
GFR calc Af Amer: >90 mL/min (ref >90)
GFR calc non Af Amer: >90 mL/min (ref >90)
Glucose, Bld: 114 mg/dL — ABNORMAL HIGH (ref 70–99)
Potassium: 3.5 mEq/L — ABNORMAL LOW (ref 3.7–5.3)
Sodium: 134 mEq/L — ABNORMAL LOW (ref 137–147)

## 2013-06-16 LAB — URINE CULTURE: Colony Count: 4000

## 2013-06-16 LAB — CBC
HCT: 35.5 % — ABNORMAL LOW (ref 39.0–52.0)
Hemoglobin: 12.5 g/dL — ABNORMAL LOW (ref 13.0–17.0)
MCH: 29.8 pg (ref 26.0–34.0)
MCHC: 35.2 g/dL (ref 30.0–36.0)
MCV: 84.7 fL (ref 78.0–100.0)
PLATELETS: 170 10*3/uL (ref 150–400)
RBC: 4.19 MIL/uL — ABNORMAL LOW (ref 4.22–5.81)
RDW: 14.8 % (ref 11.5–15.5)
WBC: 6.6 10*3/uL (ref 4.0–10.5)

## 2013-06-17 LAB — CBC WITH DIFFERENTIAL/PLATELET
BASOS PCT: 0 % (ref 0–1)
Basophils Absolute: 0 10*3/uL (ref 0.0–0.1)
EOS PCT: 1 % (ref 0–5)
Eosinophils Absolute: 0.1 10*3/uL (ref 0.0–0.7)
HEMATOCRIT: 32.6 % — AB (ref 39.0–52.0)
Hemoglobin: 11.2 g/dL — ABNORMAL LOW (ref 13.0–17.0)
Lymphocytes Relative: 20 % (ref 12–46)
Lymphs Abs: 1 10*3/uL (ref 0.7–4.0)
MCH: 29.2 pg (ref 26.0–34.0)
MCHC: 34.4 g/dL (ref 30.0–36.0)
MCV: 85.1 fL (ref 78.0–100.0)
MONO ABS: 0.3 10*3/uL (ref 0.1–1.0)
MONOS PCT: 6 % (ref 3–12)
NEUTROS PCT: 74 % (ref 43–77)
Neutro Abs: 3.9 10*3/uL (ref 1.7–7.7)
Platelets: 137 10*3/uL — ABNORMAL LOW (ref 150–400)
RBC: 3.83 MIL/uL — ABNORMAL LOW (ref 4.22–5.81)
RDW: 15.3 % (ref 11.5–15.5)
WBC: 5.2 10*3/uL (ref 4.0–10.5)

## 2013-06-17 LAB — BASIC METABOLIC PANEL
BUN: 12 mg/dL (ref 6–23)
CALCIUM: 8 mg/dL — AB (ref 8.4–10.5)
CO2: 23 mEq/L (ref 19–32)
CREATININE: 0.79 mg/dL (ref 0.50–1.35)
Chloride: 100 mEq/L (ref 96–112)
Glucose, Bld: 98 mg/dL (ref 70–99)
Potassium: 4 mEq/L (ref 3.7–5.3)
Sodium: 136 mEq/L — ABNORMAL LOW (ref 137–147)

## 2013-06-18 LAB — CULTURE, RESPIRATORY W GRAM STAIN

## 2013-06-18 LAB — CULTURE, RESPIRATORY

## 2013-06-19 LAB — COMPREHENSIVE METABOLIC PANEL
ALBUMIN: 2.6 g/dL — AB (ref 3.5–5.2)
ALT: 39 U/L (ref 0–53)
AST: 29 U/L (ref 0–37)
Alkaline Phosphatase: 59 U/L (ref 39–117)
BUN: 10 mg/dL (ref 6–23)
CALCIUM: 8.4 mg/dL (ref 8.4–10.5)
CO2: 22 mEq/L (ref 19–32)
Chloride: 98 mEq/L (ref 96–112)
Creatinine, Ser: 0.79 mg/dL (ref 0.50–1.35)
GFR calc Af Amer: >90 mL/min (ref >90)
GFR calc non Af Amer: >90 mL/min (ref >90)
Glucose, Bld: 87 mg/dL (ref 70–99)
Potassium: 3.5 mEq/L — ABNORMAL LOW (ref 3.7–5.3)
Sodium: 137 mEq/L (ref 137–147)
TOTAL PROTEIN: 6.2 g/dL (ref 6.0–8.3)
Total Bilirubin: 0.6 mg/dL (ref 0.3–1.2)

## 2013-06-19 LAB — CBC WITH DIFFERENTIAL/PLATELET
BASOS ABS: 0 10*3/uL (ref 0.0–0.1)
BASOS PCT: 0 % (ref 0–1)
EOS ABS: 0 10*3/uL (ref 0.0–0.7)
Eosinophils Relative: 0 % (ref 0–5)
HCT: 37.3 % — ABNORMAL LOW (ref 39.0–52.0)
Hemoglobin: 12.7 g/dL — ABNORMAL LOW (ref 13.0–17.0)
Lymphocytes Relative: 17 % (ref 12–46)
Lymphs Abs: 0.8 10*3/uL (ref 0.7–4.0)
MCH: 29.5 pg (ref 26.0–34.0)
MCHC: 34 g/dL (ref 30.0–36.0)
MCV: 86.5 fL (ref 78.0–100.0)
Monocytes Absolute: 0.3 10*3/uL (ref 0.1–1.0)
Monocytes Relative: 5 % (ref 3–12)
NEUTROS PCT: 78 % — AB (ref 43–77)
Neutro Abs: 3.8 10*3/uL (ref 1.7–7.7)
Platelets: 153 10*3/uL (ref 150–400)
RBC: 4.31 MIL/uL (ref 4.22–5.81)
RDW: 15.5 % (ref 11.5–15.5)
WBC: 4.9 10*3/uL (ref 4.0–10.5)

## 2013-06-19 LAB — PREALBUMIN: Prealbumin: 28.3 mg/dL (ref 17.0–34.0)

## 2013-06-20 LAB — CBC WITH DIFFERENTIAL/PLATELET
BASOS ABS: 0 10*3/uL (ref 0.0–0.1)
BASOS PCT: 0 % (ref 0–1)
EOS ABS: 0.1 10*3/uL (ref 0.0–0.7)
Eosinophils Relative: 1 % (ref 0–5)
HCT: 33.4 % — ABNORMAL LOW (ref 39.0–52.0)
Hemoglobin: 11.5 g/dL — ABNORMAL LOW (ref 13.0–17.0)
Lymphocytes Relative: 25 % (ref 12–46)
Lymphs Abs: 1 10*3/uL (ref 0.7–4.0)
MCH: 29.8 pg (ref 26.0–34.0)
MCHC: 34.4 g/dL (ref 30.0–36.0)
MCV: 86.5 fL (ref 78.0–100.0)
MONOS PCT: 9 % (ref 3–12)
Monocytes Absolute: 0.4 10*3/uL (ref 0.1–1.0)
NEUTROS ABS: 2.6 10*3/uL (ref 1.7–7.7)
Neutrophils Relative %: 64 % (ref 43–77)
Platelets: 148 10*3/uL — ABNORMAL LOW (ref 150–400)
RBC: 3.86 MIL/uL — AB (ref 4.22–5.81)
RDW: 15.7 % — AB (ref 11.5–15.5)
WBC: 4.1 10*3/uL (ref 4.0–10.5)

## 2013-06-20 LAB — BASIC METABOLIC PANEL
BUN: 11 mg/dL (ref 6–23)
CHLORIDE: 97 meq/L (ref 96–112)
CO2: 24 mEq/L (ref 19–32)
Calcium: 8.1 mg/dL — ABNORMAL LOW (ref 8.4–10.5)
Creatinine, Ser: 0.96 mg/dL (ref 0.50–1.35)
Glucose, Bld: 102 mg/dL — ABNORMAL HIGH (ref 70–99)
Potassium: 4.1 mEq/L (ref 3.7–5.3)
Sodium: 134 mEq/L — ABNORMAL LOW (ref 137–147)

## 2013-06-21 LAB — CULTURE, BLOOD (ROUTINE X 2)
CULTURE: NO GROWTH
Culture: NO GROWTH

## 2013-06-22 LAB — CBC WITH DIFFERENTIAL/PLATELET
BASOS ABS: 0 10*3/uL (ref 0.0–0.1)
BASOS PCT: 0 % (ref 0–1)
Eosinophils Absolute: 0 10*3/uL (ref 0.0–0.7)
Eosinophils Relative: 1 % (ref 0–5)
HCT: 36.1 % — ABNORMAL LOW (ref 39.0–52.0)
Hemoglobin: 12.1 g/dL — ABNORMAL LOW (ref 13.0–17.0)
LYMPHS PCT: 29 % (ref 12–46)
Lymphs Abs: 1.3 10*3/uL (ref 0.7–4.0)
MCH: 29.4 pg (ref 26.0–34.0)
MCHC: 33.5 g/dL (ref 30.0–36.0)
MCV: 87.8 fL (ref 78.0–100.0)
Monocytes Absolute: 0.4 10*3/uL (ref 0.1–1.0)
Monocytes Relative: 10 % (ref 3–12)
NEUTROS ABS: 2.6 10*3/uL (ref 1.7–7.7)
NEUTROS PCT: 60 % (ref 43–77)
Platelets: 220 10*3/uL (ref 150–400)
RBC: 4.11 MIL/uL — ABNORMAL LOW (ref 4.22–5.81)
RDW: 16.1 % — AB (ref 11.5–15.5)
WBC: 4.4 10*3/uL (ref 4.0–10.5)

## 2013-06-22 LAB — BASIC METABOLIC PANEL
BUN: 14 mg/dL (ref 6–23)
CHLORIDE: 100 meq/L (ref 96–112)
CO2: 24 mEq/L (ref 19–32)
CREATININE: 0.69 mg/dL (ref 0.50–1.35)
Calcium: 8.6 mg/dL (ref 8.4–10.5)
GFR calc non Af Amer: >90 mL/min (ref >90)
Glucose, Bld: 92 mg/dL (ref 70–99)
Potassium: 3.6 mEq/L — ABNORMAL LOW (ref 3.7–5.3)
SODIUM: 141 meq/L (ref 137–147)

## 2013-09-18 ENCOUNTER — Inpatient Hospital Stay: Payer: Self-pay | Admitting: Psychiatry

## 2013-09-18 LAB — DRUG SCREEN, URINE
Amphetamines, Ur Screen: NEGATIVE (ref ?–1000)
Barbiturates, Ur Screen: NEGATIVE (ref ?–200)
Benzodiazepine, Ur Scrn: NEGATIVE (ref ?–200)
CANNABINOID 50 NG, UR ~~LOC~~: NEGATIVE (ref ?–50)
COCAINE METABOLITE, UR ~~LOC~~: POSITIVE (ref ?–300)
MDMA (Ecstasy)Ur Screen: NEGATIVE (ref ?–500)
METHADONE, UR SCREEN: NEGATIVE (ref ?–300)
OPIATE, UR SCREEN: NEGATIVE (ref ?–300)
Phencyclidine (PCP) Ur S: NEGATIVE (ref ?–25)
Tricyclic, Ur Screen: NEGATIVE (ref ?–1000)

## 2013-09-18 LAB — CBC
HCT: 50.7 % (ref 40.0–52.0)
HGB: 16.5 g/dL (ref 13.0–18.0)
MCH: 29.9 pg (ref 26.0–34.0)
MCHC: 32.6 g/dL (ref 32.0–36.0)
MCV: 92 fL (ref 80–100)
Platelet: 231 10*3/uL (ref 150–440)
RBC: 5.53 10*6/uL (ref 4.40–5.90)
RDW: 14.1 % (ref 11.5–14.5)
WBC: 6 10*3/uL (ref 3.8–10.6)

## 2013-09-18 LAB — COMPREHENSIVE METABOLIC PANEL
ALK PHOS: 78 U/L
ANION GAP: 6 — AB (ref 7–16)
Albumin: 4.3 g/dL (ref 3.4–5.0)
BUN: 19 mg/dL — AB (ref 7–18)
Bilirubin,Total: 0.4 mg/dL (ref 0.2–1.0)
CHLORIDE: 105 mmol/L (ref 98–107)
CO2: 27 mmol/L (ref 21–32)
Calcium, Total: 9 mg/dL (ref 8.5–10.1)
Creatinine: 1.22 mg/dL (ref 0.60–1.30)
EGFR (African American): >60
EGFR (Non-African Amer.): >60
GLUCOSE: 110 mg/dL — AB (ref 65–99)
Osmolality: 279 (ref 275–301)
Potassium: 3.6 mmol/L (ref 3.5–5.1)
SGOT(AST): 30 U/L (ref 15–37)
SGPT (ALT): 29 U/L (ref 12–78)
SODIUM: 138 mmol/L (ref 136–145)
Total Protein: 8.1 g/dL (ref 6.4–8.2)

## 2013-09-18 LAB — URINALYSIS, COMPLETE
BACTERIA: NONE SEEN
Bilirubin,UR: NEGATIVE
Blood: NEGATIVE
Glucose,UR: NEGATIVE mg/dL (ref 0–75)
KETONE: NEGATIVE
LEUKOCYTE ESTERASE: NEGATIVE
Nitrite: NEGATIVE
PH: 7 (ref 4.5–8.0)
Protein: NEGATIVE
RBC,UR: 1 /HPF (ref 0–5)
SPECIFIC GRAVITY: 1.012 (ref 1.003–1.030)
Squamous Epithelial: <1
WBC UR: 4 /HPF (ref 0–5)

## 2013-09-18 LAB — ACETAMINOPHEN LEVEL

## 2013-09-18 LAB — ETHANOL: Ethanol: <3 mg/dL

## 2013-09-18 LAB — SALICYLATE LEVEL: Salicylates, Serum: 2.5 mg/dL

## 2013-09-26 ENCOUNTER — Emergency Department: Payer: Self-pay | Admitting: Emergency Medicine

## 2013-09-28 ENCOUNTER — Emergency Department: Payer: Self-pay | Admitting: Emergency Medicine

## 2013-09-29 LAB — CBC
HCT: 49.3 % (ref 40.0–52.0)
HGB: 15.9 g/dL (ref 13.0–18.0)
MCH: 29.5 pg (ref 26.0–34.0)
MCHC: 32.3 g/dL (ref 32.0–36.0)
MCV: 91 fL (ref 80–100)
PLATELETS: 203 10*3/uL (ref 150–440)
RBC: 5.4 10*6/uL (ref 4.40–5.90)
RDW: 13.5 % (ref 11.5–14.5)
WBC: 6.5 10*3/uL (ref 3.8–10.6)

## 2013-09-29 LAB — DRUG SCREEN, URINE
AMPHETAMINES, UR SCREEN: NEGATIVE (ref ?–1000)
BARBITURATES, UR SCREEN: NEGATIVE (ref ?–200)
Benzodiazepine, Ur Scrn: POSITIVE (ref ?–200)
COCAINE METABOLITE, UR ~~LOC~~: NEGATIVE (ref ?–300)
Cannabinoid 50 Ng, Ur ~~LOC~~: POSITIVE (ref ?–50)
MDMA (Ecstasy)Ur Screen: NEGATIVE (ref ?–500)
Methadone, Ur Screen: NEGATIVE (ref ?–300)
OPIATE, UR SCREEN: NEGATIVE (ref ?–300)
Phencyclidine (PCP) Ur S: NEGATIVE (ref ?–25)
Tricyclic, Ur Screen: NEGATIVE (ref ?–1000)

## 2013-09-29 LAB — COMPREHENSIVE METABOLIC PANEL
ALBUMIN: 3.6 g/dL (ref 3.4–5.0)
AST: 25 U/L (ref 15–37)
Alkaline Phosphatase: 61 U/L
Anion Gap: 5 — ABNORMAL LOW (ref 7–16)
BILIRUBIN TOTAL: 0.5 mg/dL (ref 0.2–1.0)
BUN: 10 mg/dL (ref 7–18)
CREATININE: 1.03 mg/dL (ref 0.60–1.30)
Calcium, Total: 8.2 mg/dL — ABNORMAL LOW (ref 8.5–10.1)
Chloride: 110 mmol/L — ABNORMAL HIGH (ref 98–107)
Co2: 27 mmol/L (ref 21–32)
EGFR (Non-African Amer.): >60
GLUCOSE: 91 mg/dL (ref 65–99)
Osmolality: 282 (ref 275–301)
Potassium: 3.9 mmol/L (ref 3.5–5.1)
SGPT (ALT): 30 U/L (ref 12–78)
SODIUM: 142 mmol/L (ref 136–145)
Total Protein: 7.1 g/dL (ref 6.4–8.2)

## 2013-09-29 LAB — SALICYLATE LEVEL: Salicylates, Serum: 2.5 mg/dL

## 2013-09-29 LAB — ETHANOL: Ethanol: <3 mg/dL

## 2013-09-29 LAB — ACETAMINOPHEN LEVEL: Acetaminophen: <2 ug/mL

## 2013-09-29 LAB — TSH: Thyroid Stimulating Horm: 1.55 u[IU]/mL

## 2013-12-09 ENCOUNTER — Emergency Department: Payer: Self-pay | Admitting: Emergency Medicine

## 2013-12-15 ENCOUNTER — Emergency Department: Payer: Self-pay | Admitting: Emergency Medicine

## 2014-05-11 ENCOUNTER — Emergency Department: Payer: Self-pay | Admitting: Emergency Medicine

## 2014-05-11 LAB — URINALYSIS, COMPLETE
Bilirubin,UR: NEGATIVE
Glucose,UR: NEGATIVE mg/dL (ref 0–75)
Ketone: NEGATIVE
NITRITE: NEGATIVE
PH: 6 (ref 4.5–8.0)
PROTEIN: NEGATIVE
RBC,UR: 4 /HPF (ref 0–5)
Specific Gravity: 1.005 (ref 1.003–1.030)

## 2014-08-12 ENCOUNTER — Emergency Department: Payer: Self-pay | Admitting: Emergency Medicine

## 2014-08-12 LAB — BASIC METABOLIC PANEL
Anion Gap: 11 (ref 7–16)
BUN: 7 mg/dL
CALCIUM: 8.2 mg/dL — AB
Chloride: 100 mmol/L — ABNORMAL LOW
Co2: 22 mmol/L
Creatinine: 0.96 mg/dL
EGFR (African American): >60
EGFR (Non-African Amer.): >60
GLUCOSE: 97 mg/dL
Potassium: 2.8 mmol/L — ABNORMAL LOW
Sodium: 133 mmol/L — ABNORMAL LOW

## 2014-08-12 LAB — CBC
HCT: 41.1 % (ref 40.0–52.0)
HGB: 14.1 g/dL (ref 13.0–18.0)
MCH: 29.3 pg (ref 26.0–34.0)
MCHC: 34.3 g/dL (ref 32.0–36.0)
MCV: 85 fL (ref 80–100)
Platelet: 259 10*3/uL (ref 150–440)
RBC: 4.82 10*6/uL (ref 4.40–5.90)
RDW: 12 % (ref 11.5–14.5)
WBC: 8 10*3/uL (ref 3.8–10.6)

## 2014-08-12 LAB — TROPONIN I: Troponin-I: <0.03 ng/mL

## 2014-08-12 LAB — ETHANOL: Ethanol: 130 mg/dL

## 2014-08-17 LAB — CULTURE, BLOOD (SINGLE)

## 2014-08-22 ENCOUNTER — Telehealth: Payer: Self-pay

## 2014-08-22 NOTE — Telephone Encounter (Signed)
Per Dr Ninetta LightsHatcher, [facility] as soon as possible for hospital follow up. Dr Ninetta LightsHatcher would like to see him on Friday, August 24, 2014 as a work in appointment.   Current phone number is invalid. Bridge Counselor referral is requested.   Laurell Josephsammy K Jacklyn Branan, RN

## 2014-08-23 ENCOUNTER — Encounter: Payer: Self-pay | Admitting: *Deleted

## 2014-08-31 ENCOUNTER — Inpatient Hospital Stay (HOSPITAL_COMMUNITY)
Admission: EM | Admit: 2014-08-31 | Discharge: 2014-09-03 | DRG: 975 | Disposition: A | Discharge status: Home / Self Care | Payer: Medicare Other | Attending: Internal Medicine | Admitting: Internal Medicine

## 2014-08-31 ENCOUNTER — Inpatient Hospital Stay (HOSPITAL_COMMUNITY): Payer: Medicare Other

## 2014-08-31 ENCOUNTER — Encounter (HOSPITAL_COMMUNITY): Payer: Self-pay | Admitting: *Deleted

## 2014-08-31 ENCOUNTER — Emergency Department (HOSPITAL_COMMUNITY): Payer: Medicare Other

## 2014-08-31 DIAGNOSIS — B20 Human immunodeficiency virus [HIV] disease: Secondary | ICD-10-CM | POA: Diagnosis not present

## 2014-08-31 DIAGNOSIS — Z21 Asymptomatic human immunodeficiency virus [HIV] infection status: Secondary | ICD-10-CM | POA: Diagnosis not present

## 2014-08-31 DIAGNOSIS — B348 Other viral infections of unspecified site: Secondary | ICD-10-CM | POA: Diagnosis present

## 2014-08-31 DIAGNOSIS — F1410 Cocaine abuse, uncomplicated: Secondary | ICD-10-CM | POA: Diagnosis present

## 2014-08-31 DIAGNOSIS — F329 Major depressive disorder, single episode, unspecified: Secondary | ICD-10-CM | POA: Diagnosis present

## 2014-08-31 DIAGNOSIS — R079 Chest pain, unspecified: Secondary | ICD-10-CM | POA: Diagnosis present

## 2014-08-31 DIAGNOSIS — Z91199 Patient's noncompliance with other medical treatment and regimen due to unspecified reason: Secondary | ICD-10-CM | POA: Insufficient documentation

## 2014-08-31 DIAGNOSIS — R5081 Fever presenting with conditions classified elsewhere: Secondary | ICD-10-CM

## 2014-08-31 DIAGNOSIS — Z9119 Patient's noncompliance with other medical treatment and regimen: Secondary | ICD-10-CM | POA: Diagnosis present

## 2014-08-31 DIAGNOSIS — D709 Neutropenia, unspecified: Secondary | ICD-10-CM

## 2014-08-31 DIAGNOSIS — D61818 Other pancytopenia: Secondary | ICD-10-CM | POA: Diagnosis not present

## 2014-08-31 DIAGNOSIS — B370 Candidal stomatitis: Secondary | ICD-10-CM | POA: Diagnosis not present

## 2014-08-31 DIAGNOSIS — Z96649 Presence of unspecified artificial hip joint: Secondary | ICD-10-CM | POA: Diagnosis present

## 2014-08-31 DIAGNOSIS — R509 Fever, unspecified: Secondary | ICD-10-CM | POA: Diagnosis not present

## 2014-08-31 DIAGNOSIS — R1310 Dysphagia, unspecified: Secondary | ICD-10-CM | POA: Diagnosis present

## 2014-08-31 DIAGNOSIS — F489 Nonpsychotic mental disorder, unspecified: Secondary | ICD-10-CM | POA: Diagnosis not present

## 2014-08-31 DIAGNOSIS — B59 Pneumocystosis: Secondary | ICD-10-CM | POA: Insufficient documentation

## 2014-08-31 DIAGNOSIS — R0789 Other chest pain: Secondary | ICD-10-CM

## 2014-08-31 DIAGNOSIS — Z87891 Personal history of nicotine dependence: Secondary | ICD-10-CM | POA: Diagnosis not present

## 2014-08-31 DIAGNOSIS — J989 Respiratory disorder, unspecified: Secondary | ICD-10-CM | POA: Diagnosis not present

## 2014-08-31 HISTORY — DX: Human immunodeficiency virus (HIV) disease: B20

## 2014-08-31 LAB — URINALYSIS, ROUTINE W REFLEX MICROSCOPIC
Bilirubin Urine: NEGATIVE
Glucose, UA: 100 mg/dL — AB
Hgb urine dipstick: NEGATIVE
Ketones, ur: NEGATIVE mg/dL
Leukocytes, UA: NEGATIVE
NITRITE: NEGATIVE
PROTEIN: NEGATIVE mg/dL
Specific Gravity, Urine: 1.013 (ref 1.005–1.030)
Urobilinogen, UA: 0.2 mg/dL (ref 0.0–1.0)
pH: 7.5 (ref 5.0–8.0)

## 2014-08-31 LAB — COMPREHENSIVE METABOLIC PANEL
ALK PHOS: 61 U/L (ref 39–117)
ALT: 26 U/L (ref 0–53)
AST: 25 U/L (ref 0–37)
Albumin: 3.3 g/dL — ABNORMAL LOW (ref 3.5–5.2)
Anion gap: 11 (ref 5–15)
BILIRUBIN TOTAL: 0.6 mg/dL (ref 0.3–1.2)
CO2: 26 mmol/L (ref 19–32)
CREATININE: 0.97 mg/dL (ref 0.50–1.35)
Calcium: 8.6 mg/dL (ref 8.4–10.5)
Chloride: 103 mmol/L (ref 96–112)
GFR calc Af Amer: >90 mL/min (ref >90)
GFR calc non Af Amer: >90 mL/min (ref >90)
Glucose, Bld: 89 mg/dL (ref 70–99)
Potassium: 3.9 mmol/L (ref 3.5–5.1)
Sodium: 140 mmol/L (ref 135–145)
Total Protein: 6.1 g/dL (ref 6.0–8.3)

## 2014-08-31 LAB — CBC
HEMATOCRIT: 39.5 % (ref 39.0–52.0)
HEMOGLOBIN: 13.6 g/dL (ref 13.0–17.0)
MCH: 30.4 pg (ref 26.0–34.0)
MCHC: 34.4 g/dL (ref 30.0–36.0)
MCV: 88.4 fL (ref 78.0–100.0)
PLATELETS: 147 10*3/uL — AB (ref 150–400)
RBC: 4.47 MIL/uL (ref 4.22–5.81)
RDW: 15.4 % (ref 11.5–15.5)
WBC: 2.3 10*3/uL — ABNORMAL LOW (ref 4.0–10.5)

## 2014-08-31 LAB — I-STAT TROPONIN, ED: TROPONIN I, POC: 0 ng/mL (ref 0.00–0.08)

## 2014-08-31 LAB — RAPID URINE DRUG SCREEN, HOSP PERFORMED
AMPHETAMINES: NOT DETECTED
Barbiturates: NOT DETECTED
Benzodiazepines: NOT DETECTED
Cocaine: POSITIVE — AB
Opiates: NOT DETECTED
TETRAHYDROCANNABINOL: NOT DETECTED

## 2014-08-31 LAB — CBC WITH DIFFERENTIAL/PLATELET
Basophils Absolute: 0 10*3/uL (ref 0.0–0.1)
Basophils Relative: 1 % (ref 0–1)
Eosinophils Absolute: 0 10*3/uL (ref 0.0–0.7)
Eosinophils Relative: 2 % (ref 0–5)
HCT: 37.5 % — ABNORMAL LOW (ref 39.0–52.0)
HEMOGLOBIN: 12.8 g/dL — AB (ref 13.0–17.0)
LYMPHS ABS: 0.8 10*3/uL (ref 0.7–4.0)
Lymphocytes Relative: 36 % (ref 12–46)
MCH: 30 pg (ref 26.0–34.0)
MCHC: 34.1 g/dL (ref 30.0–36.0)
MCV: 88 fL (ref 78.0–100.0)
MONOS PCT: 10 % (ref 3–12)
Monocytes Absolute: 0.2 10*3/uL (ref 0.1–1.0)
NEUTROS PCT: 51 % (ref 43–77)
Neutro Abs: 1.1 10*3/uL — ABNORMAL LOW (ref 1.7–7.7)
Platelets: 141 10*3/uL — ABNORMAL LOW (ref 150–400)
RBC: 4.26 MIL/uL (ref 4.22–5.81)
RDW: 15.3 % (ref 11.5–15.5)
WBC: 2.2 10*3/uL — ABNORMAL LOW (ref 4.0–10.5)

## 2014-08-31 LAB — LACTATE DEHYDROGENASE: LDH: 158 U/L (ref 94–250)

## 2014-08-31 MED ORDER — METHYLPREDNISOLONE SODIUM SUCC 125 MG IJ SOLR
125.0000 mg | Freq: Once | INTRAMUSCULAR | Status: AC
  Administered 2014-08-31: 125 mg via INTRAVENOUS
  Filled 2014-08-31: qty 2

## 2014-08-31 MED ORDER — ACETAMINOPHEN 325 MG PO TABS
650.0000 mg | ORAL_TABLET | Freq: Four times a day (QID) | ORAL | Status: DC | PRN
  Administered 2014-08-31 – 2014-09-02 (×6): 650 mg via ORAL
  Filled 2014-08-31 (×6): qty 2

## 2014-08-31 MED ORDER — IOHEXOL 350 MG/ML SOLN
100.0000 mL | Freq: Once | INTRAVENOUS | Status: AC | PRN
  Administered 2014-08-31: 75 mL via INTRAVENOUS

## 2014-08-31 MED ORDER — SODIUM CHLORIDE 0.9 % IV SOLN
INTRAVENOUS | Status: AC
  Administered 2014-08-31 (×3): via INTRAVENOUS

## 2014-08-31 MED ORDER — ACETAMINOPHEN 650 MG RE SUPP: 650.0000 mg | Freq: Four times a day (QID) | RECTAL | Status: DC | PRN

## 2014-08-31 MED ORDER — ONDANSETRON HCL 4 MG PO TABS: 4.0000 mg | ORAL_TABLET | Freq: Four times a day (QID) | ORAL | Status: DC | PRN

## 2014-08-31 MED ORDER — DEXTROSE 5 % IV SOLN
2.0000 g | Freq: Three times a day (TID) | INTRAVENOUS | Status: DC
  Administered 2014-08-31 – 2014-09-01 (×4): 2 g via INTRAVENOUS
  Filled 2014-08-31 (×6): qty 2

## 2014-08-31 MED ORDER — VANCOMYCIN HCL 10 G IV SOLR
1500.0000 mg | Freq: Once | INTRAVENOUS | Status: AC
  Administered 2014-08-31: 1500 mg via INTRAVENOUS
  Filled 2014-08-31: qty 1500

## 2014-08-31 MED ORDER — SULFAMETHOXAZOLE-TRIMETHOPRIM 400-80 MG/5ML IV SOLN
420.0000 mg | Freq: Three times a day (TID) | INTRAVENOUS | Status: DC
  Administered 2014-08-31 – 2014-09-03 (×9): 420 mg via INTRAVENOUS
  Filled 2014-08-31 (×17): qty 26.3

## 2014-08-31 MED ORDER — ALBUTEROL SULFATE (2.5 MG/3ML) 0.083% IN NEBU: 2.5000 mg | INHALATION_SOLUTION | RESPIRATORY_TRACT | Status: DC | PRN

## 2014-08-31 MED ORDER — ONDANSETRON HCL 4 MG/2ML IJ SOLN: 4.0000 mg | Freq: Four times a day (QID) | INTRAMUSCULAR | Status: DC | PRN

## 2014-08-31 MED ORDER — SULFAMETHOXAZOLE-TRIMETHOPRIM 800-160 MG PO TABS
1.0000 | ORAL_TABLET | Freq: Once | ORAL | Status: AC
  Administered 2014-08-31: 1 via ORAL
  Filled 2014-08-31: qty 1

## 2014-08-31 MED ORDER — CYCLOBENZAPRINE HCL 10 MG PO TABS
5.0000 mg | ORAL_TABLET | Freq: Three times a day (TID) | ORAL | Status: DC | PRN
  Administered 2014-08-31 – 2014-09-02 (×6): 5 mg via ORAL
  Filled 2014-08-31 (×6): qty 1

## 2014-08-31 MED ORDER — DIPHENHYDRAMINE-ZINC ACETATE 2-0.1 % EX CREA
TOPICAL_CREAM | Freq: Two times a day (BID) | CUTANEOUS | Status: DC | PRN
  Administered 2014-08-31: via TOPICAL
  Filled 2014-08-31: qty 28

## 2014-08-31 MED ORDER — VANCOMYCIN HCL IN DEXTROSE 1-5 GM/200ML-% IV SOLN
1000.0000 mg | Freq: Three times a day (TID) | INTRAVENOUS | Status: DC
  Administered 2014-08-31 – 2014-09-01 (×3): 1000 mg via INTRAVENOUS
  Filled 2014-08-31 (×5): qty 200

## 2014-08-31 MED ORDER — ENOXAPARIN SODIUM 40 MG/0.4ML ~~LOC~~ SOLN
40.0000 mg | Freq: Every day | SUBCUTANEOUS | Status: DC
  Filled 2014-08-31 (×2): qty 0.4

## 2014-08-31 NOTE — Progress Notes (Addendum)
ANTIBIOTIC CONSULT NOTE - INITIAL  Pharmacy Consult for Vancomycin/Cefepime/Bactrim  Indication: Fever, r/o HCAP, r/o pneumocystis PNA  Allergies  Allergen Reactions  . Amoxicillin Hives  . Ketorolac Hives  . Penicillins Hives  . Tramadol     Seizures    Patient Measurements: Height: 5\' 9"  (175.3 cm) Weight: 185 lb (83.915 kg) IBW/kg (Calculated) : 70.7  Vital Signs: Temp: 100.4 F (38 C) (04/15 0414) Temp Source: Rectal (04/15 0414) BP: 137/88 mmHg (04/15 0254) Pulse Rate: 94 (04/15 0254)   Labs:  Recent Labs  08/31/14 0102 08/31/14 0114 08/31/14 0256  WBC 2.3*  --  2.2*  HGB 13.6  --  12.8*  PLT 147*  --  141*  CREATININE  --  0.97  --    Estimated Creatinine Clearance: 102.2 mL/min (by C-G formula based on Cr of 0.97).  Medical History: Past Medical History  Diagnosis Date  . Pneumonia   . Anxiety   . AIDS (acquired immune deficiency syndrome)     Assessment: 39 y/o M with hx of HIV, [facility] per MD note, WBC/ANC low, renal function ok, other labs as above. Currently having fevers.   Plan:  -Cefepime 2g IV q8h -Trend WBC, temp, renal function -F/U additional anti-biotics as indicated   Abran DukeLedford, Lind Ausley 08/31/2014,4:47 AM   ===================== Adding vancomycin and Bactrim to provide HCAP and pneumocystis coverage -Vancomycin 1000 mg IV q8h -Bactrim 15 mg/kg/day IV q8h -Drug levels at steady state Wilmer FloorJLedford, PharmD

## 2014-08-31 NOTE — ED Notes (Signed)
Pt arrives via [facility]. Pt c/o of right flank pain and rib pain that began about a week ago. [facility] however pt has been non complaint with treatment. Pt also c/o difficulty urinating.

## 2014-08-31 NOTE — Progress Notes (Signed)
[facility] PROGRESS NOTE  Mason AgresteChristopher Meadows ERX:[id number] DOB: 31-Mar-1976 DOA: 08/31/2014 PCP: Mason DownsMASOUD,JAVED, Meadows  Same Day Note  Assessment/Plan: 1. Fever with chest pains 1. On empiric vancomycin, cefepime, and bactrim 2. Respiratory culture pending 3. Presently afebrile since transfer to floor from ED 4. Follow pan cultures 5. Blood cx pending 6. Will check UA 2. HIV/AIDS 1. CD4 and viral load pending 2. Not taking anti-virals 3. Pancytopenia 1. Likely secondary to HIV/AIDS 4. Polysubstance abuse 1. Cocaine positive 5. Tobacco abuse 1. Cessation done at bedside 6. DVT prophylaxis 1. lovenox  Code Status: Full Family Communication: Pt in room (indicate person spoken with, relationship, and if by phone, the number) Disposition Plan: Pending   Consultants:    Procedures:    Antibiotics:  Cefepime 4/15>>>  Vancomycin 4/15>>>  Bactrim 4/15>>>  HPI/Subjective: States he is tired of blood draws, thus has refused AM labs  Objective: Filed Vitals:   08/31/14 0610 08/31/14 0611 08/31/14 0955 08/31/14 1247  BP:  147/81 148/92   Pulse:  98 84 99  Temp:  99.6 F (37.6 C) 98.8 F (37.1 C) 98.6 F (37 C)  TempSrc:  Oral Oral Oral  Resp:   20 20  Height: 5\' 9"  (1.753 m)     Weight: 83.371 kg (183 lb 12.8 oz)     SpO2:  97% 98% 96%    Intake/Output Summary (Last 24 hours) at 08/31/14 1627 Last data filed at 08/31/14 1242  Gross per 24 hour  Intake    480 ml  Output      0 ml  Net    480 ml   Filed Weights   08/31/14 0042 08/31/14 0610  Weight: 83.915 kg (185 lb) 83.371 kg (183 lb 12.8 oz)    Exam:   General:  Asleep, arousable, in nad  Cardiovascular: regular, s1, s2  Respiratory: normal resp effort, no wheezing  Abdomen: soft,nondistended  Musculoskeletal: perfused, no clubbing   Data Reviewed: Basic Metabolic Panel:  Recent Labs Lab 08/31/14 0114  NA 140  Meadows 3.9  CL 103  CO2 26  GLUCOSE 89  BUN <5*  CREATININE 0.97   CALCIUM 8.6   Liver Function Tests:  Recent Labs Lab 08/31/14 0114  AST 25  ALT 26  ALKPHOS 61  BILITOT 0.6  PROT 6.1  ALBUMIN 3.3*   No results for input(s): LIPASE, AMYLASE in the last 168 hours. No results for input(s): AMMONIA in the last 168 hours. CBC:  Recent Labs Lab 08/31/14 0102 08/31/14 0256  WBC 2.3* 2.2*  NEUTROABS  --  1.1*  HGB 13.6 12.8*  HCT 39.5 37.5*  MCV 88.4 88.0  PLT 147* 141*   Cardiac Enzymes: No results for input(s): CKTOTAL, CKMB, CKMBINDEX, TROPONINI in the last 168 hours. BNP (last 3 results) No results for input(s): BNP in the last 8760 hours.  ProBNP (last 3 results) No results for input(s): PROBNP in the last 8760 hours.  CBG: No results for input(s): GLUCAP in the last 168 hours.  No results found for this or any previous visit (from the past 240 hour(s)).   Studies: Dg Chest 2 View (if Patient Has Fever And/or Copd)  08/31/2014   CLINICAL DATA:  Right flank and rib pain, symptoms for 1 week.  EXAM: CHEST  2 VIEW  COMPARISON:  06/14/2013  FINDINGS: Lung volumes are low with mild bibasilar atelectasis. The cardiomediastinal contours are normal. Pulmonary vasculature is normal. No consolidation, pleural effusion, or pneumothorax. No acute osseous abnormalities are seen.  IMPRESSION: Hypoventilatory chest with bibasilar atelectasis.   Electronically Signed   By: Rubye Oaks M.D.   On: 08/31/2014 02:44   Ct Angio Chest Pe W/cm &/or Wo Cm  08/31/2014   CLINICAL DATA:  Right-sided chest pain with inspiration  EXAM: CT ANGIOGRAPHY CHEST WITH CONTRAST  TECHNIQUE: Multidetector CT imaging of the chest was performed using the standard protocol during bolus administration of intravenous contrast. Multiplanar CT image reconstructions and MIPs were obtained to evaluate the vascular anatomy.  CONTRAST:  75mL OMNIPAQUE IOHEXOL 350 MG/ML SOLN  COMPARISON:  None.  FINDINGS: THORACIC INLET/BODY WALL:  No acute abnormality.  MEDIASTINUM:  Normal  heart size. No pericardial effusion. No acute vascular abnormality. Borderline diffuse esophageal thickening. CTA of the pulmonary arteries is limited by motion, especially at the bases, but exam is overall diagnostic and negative for pulmonary embolism. No aortic aneurysm or dissection.  LUNG WINDOWS:  Central bronchial wall thickening with intermittent mucoid impaction. There is no focal pneumonia. No edema, effusion, or pneumothorax. No suspicious ground-glass density or cystic change in this patient with history presumed pneumocystis pneumonia. There are few scattered pulmonary nodules, with the larger in the left upper lobe on image 26 measuring 6 mm.  UPPER ABDOMEN:  No acute findings.  OSSEOUS:  Remote T3, T4, T5, T6, and T7 superior endplate/compression fractures with exaggerated thoracic kyphosis. Height loss is worst at T4, approximately 75%. There is bilateral foraminal narrowing at T4-5 from disc loss and facet fusion with bony overgrowth.  Review of the MIP images confirms the above findings.  IMPRESSION: 1. No evidence of pulmonary embolism. 2. Bronchitis. 3. 6 mm left upper lobe pulmonary nodule. Given smoking history, follow-up chest CT at 6 - 12 months is recommended. This recommendation follows the consensus statement: Guidelines for Management of Small Pulmonary Nodules Detected on CT Scans: A Statement from the Fleischner Society as published in Radiology 2005;237:395-400. 4. Remote compression/superior endplate fractures of T3 through T7 with exaggerated kyphosis.   Electronically Signed   By: Marnee Spring M.D.   On: 08/31/2014 06:25    Scheduled Meds: . ceFEPime (MAXIPIME) IV  2 g Intravenous 3 times per day  . enoxaparin (LOVENOX) injection  40 mg Subcutaneous Daily  . sulfamethoxazole-trimethoprim  420 mg Intravenous 3 times per day  . vancomycin  1,000 mg Intravenous Q8H   Continuous Infusions: . sodium chloride 100 mL/hr at 08/31/14 0815    Principal Problem:    Fever Active Problems:   Chest pain   Pancytopenia   HIV (human immunodeficiency virus infection)    Mason Meadows  [facility] Pager [phone]. If 7PM-7AM, please contact night-coverage at [email or web address], password [facility] 08/31/2014, 4:27 PM  LOS: 0 days

## 2014-08-31 NOTE — Progress Notes (Signed)
Attempted to call and get report. Nurse said they would call back. Suzy Bouchardhompson, Satonya Lux E, RN 08/31/2014 5:01 AM

## 2014-08-31 NOTE — Progress Notes (Signed)
No stool culture collected. Pt had no stool during shift.

## 2014-08-31 NOTE — Progress Notes (Signed)
CARE MANAGEMENT NOTE 08/31/2014  Patient:  Mason Meadows,Mason Meadows   Account Number:  [id number]  Date Initiated:  08/31/2014  Documentation initiated by:  Jiles CrockerHANDLER,Bodee Lafoe  Subjective/Objective Assessment:   ADMITTED WITH NEUTROPENIA FEVER     Action/Plan:   CM FOLLOWING FOR DCP   Anticipated DC Date:  09/03/2014   Anticipated DC Plan:  HOME      DC Planning Services  CM consult          Status of service:  In process, will continue to follow  Per UR Regulation:  Reviewed for med. necessity/level of care/duration of stay  Comments:  4/15/2016[name]- B Jennye Runquist RN,BSN,MHA [phone]

## 2014-08-31 NOTE — ED Provider Notes (Signed)
CSN: [id number]     Arrival date & time 08/31/14  0031 History  This chart was scribed for Mason Mo, MD by Tanda Rockers, ED Scribe. This patient was seen in room A06C/A06C and the patient's care was started at 12:52 AM.    Chief Complaint  Patient presents with  . Chest Pain    Ribs   The history is provided by the patient. No language interpreter was used.     HPI Comments: Li Fragoso is a 39 y.o. male who presents to the Emergency Department complaining of dyspnea x 1 week, worsening over last 24 hours.  He was recently admitted to [facility] for PNA, went home on abx. He mentions right rib pain and feels as though his right lung is collapsing. He also complains of right jaw pain, tongue swelling, dizziness, difficulty urinating, and reflux after eating or drinking. He is noncompliant with HIV therapy.  He mentions that he has been having subjective fevers as well and waking up in sweats.   Past Medical History  Diagnosis Date  . Pneumonia   . Anxiety   . AIDS (acquired immune deficiency syndrome)    Past Surgical History  Procedure Laterality Date  . Back surgery    . Total hip arthroplasty     No family history on file. History  Substance Use Topics  . Smoking status: Former Smoker -- 1.00 packs/day    Types: Cigarettes    Quit date: 05/14/2013  . Smokeless tobacco: Never Used  . Alcohol Use: 0.6 oz/week    1 Standard drinks or equivalent per week    Review of Systems  Constitutional: Positive for fever.  HENT:       Positive for right jaw pain. Tongue swelling.   Respiratory: Positive for shortness of breath.   Cardiovascular:       Positive for right rib pain. Reflux.   Genitourinary: Positive for difficulty urinating.  Neurological: Positive for dizziness.  All other systems reviewed and are negative.     Allergies  Amoxicillin; Ketorolac; Penicillins; and Tramadol  Home Medications   Prior to Admission medications   Medication Sig Start Date End  Date Taking? Authorizing Provider  acetaminophen (TYLENOL) 325 MG tablet Take 2 tablets (650 mg total) by mouth every 6 (six) hours as needed for mild pain (or Fever >/= 101). Patient not taking: Reported on 08/31/2014 06/02/13   Stephani Police, PA-C  albuterol (PROVENTIL) (2.5 MG/3ML) 0.083% nebulizer solution Take 3 mLs (2.5 mg total) by nebulization every 2 (two) hours as needed for wheezing. Patient not taking: Reported on 08/31/2014 06/02/13   Stephani Police, PA-C  azithromycin (ZITHROMAX) 600 MG tablet Take 2 tablets (1,200 mg total) by mouth once a week. Patient not taking: Reported on 08/31/2014 06/02/13   Tora Kindred York, PA-C  dextrose 5 % SOLN 500 mL with sulfamethoxazole-trimethoprim 400-80 MG/5ML SOLN 320 mg Inject 320 mg into the vein every 8 (eight) hours. Patient not taking: Reported on 08/31/2014 06/02/13   Tora Kindred York, PA-C  enoxaparin (LOVENOX) 40 MG/0.4ML injection Inject 0.4 mLs (40 mg total) into the skin daily. Patient not taking: Reported on 08/31/2014 06/02/13   Stephani Police, PA-C  feeding supplement, RESOURCE BREEZE, (RESOURCE BREEZE) LIQD Take 1 Container by mouth 2 (two) times daily between meals. Patient not taking: Reported on 08/31/2014 06/02/13   Stephani Police, PA-C  guaiFENesin (MUCINEX) 600 MG 12 hr tablet Take 2 tablets (1,200 mg total) by mouth 2 (two) times daily. Patient  not taking: Reported on 08/31/2014 06/02/13   Stephani PoliceMarianne L York, PA-C  ipratropium-albuterol (DUONEB) 0.5-2.5 (3) MG/3ML SOLN Take 3 mLs by nebulization 3 (three) times daily. Patient not taking: Reported on 08/31/2014 06/02/13   Stephani PoliceMarianne L York, PA-C  levofloxacin (LEVAQUIN) 750 MG/150ML SOLN Inject 150 mLs (750 mg total) into the vein daily. Patient not taking: Reported on 08/31/2014 06/02/13   Tora KindredMarianne L York, PA-C  LORazepam (ATIVAN) 1 MG tablet Take 1 tablet (1 mg total) by mouth every 6 (six) hours as needed for anxiety. Patient not taking: Reported on 08/31/2014 06/02/13   Stephani PoliceMarianne L York, PA-C   methylPREDNISolone sodium succinate (SOLU-MEDROL) 125 mg/2 mL injection Inject 0.96 mLs (60 mg total) into the vein every 6 (six) hours. Patient not taking: Reported on 08/31/2014 06/02/13   Tora KindredMarianne L York, PA-C  ondansetron (ZOFRAN) 4 MG tablet Take 1 tablet (4 mg total) by mouth every 6 (six) hours as needed for nausea. Patient not taking: Reported on 08/31/2014 06/02/13   Stephani PoliceMarianne L York, PA-C  oxyCODONE (OXY IR/ROXICODONE) 5 MG immediate release tablet Take 1-2 tablets (5-10 mg total) by mouth every 4 (four) hours as needed for moderate pain. Patient not taking: Reported on 08/31/2014 06/02/13   Stephani PoliceMarianne L York, PA-C  sodium chloride 0.45 % solution Inject 100 mLs into the vein continuous. Patient not taking: Reported on 08/31/2014 06/02/13   Stephani PoliceMarianne L York, PA-C  zolpidem (AMBIEN) 5 MG tablet Take 1 tablet (5 mg total) by mouth at bedtime as needed for sleep (insomnia). Patient not taking: Reported on 08/31/2014 06/02/13   Stephani PoliceMarianne L York, PA-C   Triage Vitals: BP 135/92 mmHg  Pulse 89  Temp(Src) 99.8 F (37.7 C) (Oral)  Resp 20  Ht 5\' 9"  (1.753 m)  Wt 185 lb (83.915 kg)  BMI 27.31 kg/m2  SpO2 100%   Physical Exam  Constitutional: He is oriented to person, place, and time. He appears well-developed and well-nourished.  HENT:  Head: Normocephalic and atraumatic.  Mouth/Throat: No oropharyngeal exudate, posterior oropharyngeal edema or posterior oropharyngeal erythema.  Eyes: Conjunctivae and EOM are normal.  Neck: Normal range of motion and full passive range of motion without pain. Neck supple. No Brudzinski's sign and no Kernig's sign noted.  Cardiovascular: Normal rate, regular rhythm and normal heart sounds.   Pulmonary/Chest: Effort normal and breath sounds normal. No respiratory distress.  Abdominal: He exhibits no distension. There is no tenderness. There is no rebound and no guarding.  Musculoskeletal: Normal range of motion.  Lymphadenopathy:       Head (right side): Submental  adenopathy present.       Head (left side): Submental adenopathy present.    He has cervical adenopathy.       Right cervical: Superficial cervical adenopathy present.       Left cervical: Superficial cervical adenopathy present.  Neurological: He is alert and oriented to person, place, and time.  Skin: Skin is warm and dry.  Vitals reviewed.   ED Course  Procedures (including critical care time)  DIAGNOSTIC STUDIES: Oxygen Saturation is 100% on RA, normal by my interpretation.    COORDINATION OF CARE: 1:03 AM-Discussed treatment plan which includes CXR, CMP, CBC, Troponin with pt at bedside and pt agreed to plan.   Labs Review Labs Reviewed  CBC - Abnormal; Notable for the following:    WBC 2.3 (*)    Platelets 147 (*)    All other components within normal limits  COMPREHENSIVE METABOLIC PANEL - Abnormal; Notable for the  following:    BUN <5 (*)    Albumin 3.3 (*)    All other components within normal limits  CBC WITH DIFFERENTIAL/PLATELET - Abnormal; Notable for the following:    WBC 2.2 (*)    Hemoglobin 12.8 (*)    HCT 37.5 (*)    Platelets 141 (*)    Neutro Abs 1.1 (*)    All other components within normal limits  CULTURE, BLOOD (ROUTINE X 2)  CULTURE, BLOOD (ROUTINE X 2)  RESPIRATORY VIRUS PANEL  I-STAT TROPOININ, ED    Imaging Review Dg Chest 2 View (if Patient Has Fever And/or Copd)  08/31/2014   CLINICAL DATA:  Right flank and rib pain, symptoms for 1 week.  EXAM: CHEST  2 VIEW  COMPARISON:  06/14/2013  FINDINGS: Lung volumes are low with mild bibasilar atelectasis. The cardiomediastinal contours are normal. Pulmonary vasculature is normal. No consolidation, pleural effusion, or pneumothorax. No acute osseous abnormalities are seen.  IMPRESSION: Hypoventilatory chest with bibasilar atelectasis.   Electronically Signed   By: Rubye Oaks M.D.   On: 08/31/2014 02:44     EKG Interpretation   Date/Time:  Friday August 31 2014 00:41:33 EDT Ventricular Rate:   91 PR Interval:  168 QRS Duration: 83 QT Interval:  352 QTC Calculation: 433 R Axis:   36 Text Interpretation:  Sinus rhythm Probable left atrial enlargement RSR'  in V1 or V2, probably normal variant No significant change since last  tracing Confirmed by Mason Meadows [id number]) on 08/31/2014 2:12:59 AM      MDM   Final diagnoses:  Neutropenic fever  AIDS    39 y.o. male with pertinent PMH of AIDS (noncompliant with therapy) presents with fever, cough, dyspnea in context of recent admission for PNA.  He has been noncompliant with all therapy since leaving.  On arrival today vitals and physical exam as above.  No meningitic signs.  WU significant for neutropenia.  Broad spectrum abx and blood cultures drawn.  Solumedrol given.  Admitted in stable condition.    I have reviewed all laboratory and imaging studies if ordered as above  1. Neutropenic fever   2. AIDS         Mason Mo, MD 08/31/14 [phone]

## 2014-08-31 NOTE — Progress Notes (Signed)
Pt arrived from ED with tech with temp and reporting pain. Will continue to monitor. Melina Schoolshompson, Rashaan Wyles E, [place]RN 08/31/2014 7:28 AM

## 2014-08-31 NOTE — H&P (Signed)
[facility] History and Physical  Mason Meadows WJX:[id number] DOB: 07-23-1975 DOA: 08/31/2014  Referring physician: Dr. Littie Meadows. PCP: Mason Downs, MD   Chief Complaint: Fever and chest pain.  HPI: Mason Meadows is a 39 y.o. male with history of HIV/AIDS presents to the ER because of increasing right-sided chest pain with subjective feeling of fever chills and at times having shortness of breath. Patient has been having these symptoms since he got discharged 2 weeks ago from [facility]. Patient states 2 weeks ago he was admitted for pneumonia. Patient states he has not been taking his medications for HIV for many months now. Patient does not recall his CD4 counts but states it was low. Has had some nausea and has been having diarrhea since his discharge from [facility]. Denies any abdominal pain. Physical chest pain most of the right-sided constant denies any exertional symptoms. In the ER patient was found to be febrile with low white cell count. Chest x-ray did not show anything acute. EKG was unremarkable and troponin was negative. Patient is being admitted for fever.   Review of Systems: As presented in the history of presenting illness, rest negative.  Past Medical History  Diagnosis Date  . Pneumonia   . Anxiety   . AIDS (acquired immune deficiency syndrome)    Past Surgical History  Procedure Laterality Date  . Back surgery    . Total hip arthroplasty     Social History:  reports that he quit smoking about 15 months ago. His smoking use included Cigarettes. He smoked 1.00 pack per day. He has never used smokeless tobacco. He reports that he drinks about 0.6 oz of alcohol per week. He reports that he does not use illicit drugs. Where does patient live home. Can patient participate in ADLs? Yes.  Allergies  Allergen Reactions  . Amoxicillin Hives  . Ketorolac Hives  . Penicillins Hives  . Tramadol     Seizures    Family History:  Family History   Problem Relation Age of Onset  . Hypertension Mother   . Diabetes Mellitus II Mother   . Hypertension Father   . Diabetes Mellitus II Father       Prior to Admission medications   Medication Sig Start Date End Date Taking? Authorizing Provider  acetaminophen (TYLENOL) 325 MG tablet Take 2 tablets (650 mg total) by mouth every 6 (six) hours as needed for mild pain (or Fever >/= 101). Patient not taking: Reported on 08/31/2014 06/02/13   Stephani Police, PA-C  albuterol (PROVENTIL) (2.5 MG/3ML) 0.083% nebulizer solution Take 3 mLs (2.5 mg total) by nebulization every 2 (two) hours as needed for wheezing. Patient not taking: Reported on 08/31/2014 06/02/13   Stephani Police, PA-C  azithromycin (ZITHROMAX) 600 MG tablet Take 2 tablets (1,200 mg total) by mouth once a week. Patient not taking: Reported on 08/31/2014 06/02/13   Tora Kindred York, PA-C  dextrose 5 % SOLN 500 mL with sulfamethoxazole-trimethoprim 400-80 MG/5ML SOLN 320 mg Inject 320 mg into the vein every 8 (eight) hours. Patient not taking: Reported on 08/31/2014 06/02/13   Tora Kindred York, PA-C  enoxaparin (LOVENOX) 40 MG/0.4ML injection Inject 0.4 mLs (40 mg total) into the skin daily. Patient not taking: Reported on 08/31/2014 06/02/13   Stephani Police, PA-C  feeding supplement, RESOURCE BREEZE, (RESOURCE BREEZE) LIQD Take 1 Container by mouth 2 (two) times daily between meals. Patient not taking: Reported on 08/31/2014 06/02/13   Stephani Police, PA-C  guaiFENesin [facility])  600 MG 12 hr tablet Take 2 tablets (1,200 mg total) by mouth 2 (two) times daily. Patient not taking: Reported on 08/31/2014 06/02/13   Stephani Police, PA-C  ipratropium-albuterol (DUONEB) 0.5-2.5 (3) MG/3ML SOLN Take 3 mLs by nebulization 3 (three) times daily. Patient not taking: Reported on 08/31/2014 06/02/13   Stephani Police, PA-C  levofloxacin (LEVAQUIN) 750 MG/150ML SOLN Inject 150 mLs (750 mg total) into the vein daily. Patient not taking: Reported on 08/31/2014  06/02/13   Tora Kindred York, PA-C  LORazepam (ATIVAN) 1 MG tablet Take 1 tablet (1 mg total) by mouth every 6 (six) hours as needed for anxiety. Patient not taking: Reported on 08/31/2014 06/02/13   Stephani Police, PA-C  methylPREDNISolone sodium succinate (SOLU-MEDROL) 125 mg/2 mL injection Inject 0.96 mLs (60 mg total) into the vein every 6 (six) hours. Patient not taking: Reported on 08/31/2014 06/02/13   Tora Kindred York, PA-C  ondansetron (ZOFRAN) 4 MG tablet Take 1 tablet (4 mg total) by mouth every 6 (six) hours as needed for nausea. Patient not taking: Reported on 08/31/2014 06/02/13   Stephani Police, PA-C  oxyCODONE (OXY IR/ROXICODONE) 5 MG immediate release tablet Take 1-2 tablets (5-10 mg total) by mouth every 4 (four) hours as needed for moderate pain. Patient not taking: Reported on 08/31/2014 06/02/13   Stephani Police, PA-C  sodium chloride 0.45 % solution Inject 100 mLs into the vein continuous. Patient not taking: Reported on 08/31/2014 06/02/13   Stephani Police, PA-C  zolpidem (AMBIEN) 5 MG tablet Take 1 tablet (5 mg total) by mouth at bedtime as needed for sleep (insomnia). Patient not taking: Reported on 08/31/2014 06/02/13   Stephani Police, PA-C    Physical Exam: Filed Vitals:   08/31/14 0100 08/31/14 0115 08/31/14 0254 08/31/14 0414  BP: 129/86 137/97 137/88   Pulse: 92 94 94   Temp:   99.3 F (37.4 C) 100.4 F (38 C)  TempSrc:   Oral Rectal  Resp: Height:      Weight:      SpO2: 100% 99% 100%      General:  Well-developed and nourished.  Eyes: Anicteric no pallor.  ENT: No oral thrush no discharge from ears eyes nose or mouth.  Neck: No mass felt.  Cardiovascular: S1-S2 heard.  Respiratory: No rhonchi or crepitations.  Abdomen: Soft nontender bowel sounds present.  Skin: No rash.  Musculoskeletal: No edema.  Psychiatric: Appears normal.  Neurologic: Alert awake oriented to time place and person. Moves all extremities.  Labs on Admission:   Basic Metabolic Panel:  Recent Labs Lab 08/31/14 0114  NA 140  K 3.9  CL 103  CO2 26  GLUCOSE 89  BUN <5*  CREATININE 0.97  CALCIUM 8.6   Liver Function Tests:  Recent Labs Lab 08/31/14 0114  AST 25  ALT 26  ALKPHOS 61  BILITOT 0.6  PROT 6.1  ALBUMIN 3.3*   No results for input(s): LIPASE, AMYLASE in the last 168 hours. No results for input(s): AMMONIA in the last 168 hours. CBC:  Recent Labs Lab 08/31/14 0102 08/31/14 0256  WBC 2.3* 2.2*  NEUTROABS  --  1.1*  HGB 13.6 12.8*  HCT 39.5 37.5*  MCV 88.4 88.0  PLT 147* 141*   Cardiac Enzymes: No results for input(s): CKTOTAL, CKMB, CKMBINDEX, TROPONINI in the last 168 hours.  BNP (last 3 results) No results for input(s): BNP in the last 8760 hours.  ProBNP (last 3  results) No results for input(s): PROBNP in the last 8760 hours.  CBG: No results for input(s): GLUCAP in the last 168 hours.  Radiological Exams on Admission: Dg Chest 2 View (if Patient Has Fever And/or Copd)  08/31/2014   CLINICAL DATA:  Right flank and rib pain, symptoms for 1 week.  EXAM: CHEST  2 VIEW  COMPARISON:  06/14/2013  FINDINGS: Lung volumes are low with mild bibasilar atelectasis. The cardiomediastinal contours are normal. Pulmonary vasculature is normal. No consolidation, pleural effusion, or pneumothorax. No acute osseous abnormalities are seen.  IMPRESSION: Hypoventilatory chest with bibasilar atelectasis.   Electronically Signed   By: Rubye OaksMelanie  Ehinger M.D.   On: 08/31/2014 02:44    EKG: Independently reviewed. Normal sinus rhythm.  Assessment/Plan Principal Problem:   Fever Active Problems:   Chest pain   Pancytopenia   HIV (human immunodeficiency virus infection)   1. Fever with chest pain - cause not clear. At this time given his right-sided chest pain and occasionally having shortness of breath I have ordered CT and get off her chest. Patient has been placed on empiric antibiotics vancomycin and cefepime and also we  have added Bactrim. Follow blood cultures, check influenza and respiratory virus panel and also check stool studies since patient has been having diarrhea.  2. HIV/AIDS - patient last CD4 count is not clear. Check CD4 counts and viral load. May need infectious disease consult in a.m. for further recommendations on patient's antiretroviral therapy. 3. Pancytopenia most likely from HIV - follow CBC closely. 4. Tobacco abuse - tobacco cessation counseling requested.   DVT Prophylaxis Lovenox.  Code Status: Full code.  Family Communication: None.  Disposition Plan: Admit to inpatient.    KAKRAKANDY,ARSHAD N. [facility] Pager [phone].  If 7PM-7AM, please contact night-coverage [email or web address] Password [email or web address] 08/31/2014, 5:19 AM

## 2014-09-01 DIAGNOSIS — Z91199 Patient's noncompliance with other medical treatment and regimen due to unspecified reason: Secondary | ICD-10-CM | POA: Insufficient documentation

## 2014-09-01 DIAGNOSIS — Z9119 Patient's noncompliance with other medical treatment and regimen: Secondary | ICD-10-CM | POA: Insufficient documentation

## 2014-09-01 DIAGNOSIS — B59 Pneumocystosis: Secondary | ICD-10-CM | POA: Insufficient documentation

## 2014-09-01 LAB — INFLUENZA PANEL BY PCR (TYPE A & B)
H1N1 flu by pcr: NOT DETECTED
INFLBPCR: NEGATIVE
Influenza A By PCR: NEGATIVE

## 2014-09-01 LAB — BASIC METABOLIC PANEL
ANION GAP: 9 (ref 5–15)
BUN: 8 mg/dL (ref 6–23)
CHLORIDE: 109 mmol/L (ref 96–112)
CO2: 21 mmol/L (ref 19–32)
Calcium: 8.1 mg/dL — ABNORMAL LOW (ref 8.4–10.5)
Creatinine, Ser: 0.98 mg/dL (ref 0.50–1.35)
GFR calc Af Amer: >90 mL/min (ref >90)
GFR calc non Af Amer: >90 mL/min (ref >90)
Glucose, Bld: 85 mg/dL (ref 70–99)
POTASSIUM: 3.5 mmol/L (ref 3.5–5.1)
SODIUM: 139 mmol/L (ref 135–145)

## 2014-09-01 LAB — CBC
HCT: 38.6 % — ABNORMAL LOW (ref 39.0–52.0)
Hemoglobin: 13 g/dL (ref 13.0–17.0)
MCH: 29.7 pg (ref 26.0–34.0)
MCHC: 33.7 g/dL (ref 30.0–36.0)
MCV: 88.3 fL (ref 78.0–100.0)
PLATELETS: 127 10*3/uL — AB (ref 150–400)
RBC: 4.37 MIL/uL (ref 4.22–5.81)
RDW: 15.3 % (ref 11.5–15.5)
WBC: 3.4 10*3/uL — ABNORMAL LOW (ref 4.0–10.5)

## 2014-09-01 LAB — RESPIRATORY VIRUS PANEL
Adenovirus: NEGATIVE
INFLUENZA A: NEGATIVE
INFLUENZA B 1: NEGATIVE
Metapneumovirus: NEGATIVE
PARAINFLUENZA 2 A: NEGATIVE
Parainfluenza 1: NEGATIVE
Parainfluenza 3: NEGATIVE
RHINOVIRUS: POSITIVE — AB
Respiratory Syncytial Virus A: NEGATIVE
Respiratory Syncytial Virus B: NEGATIVE

## 2014-09-01 LAB — CLOSTRIDIUM DIFFICILE BY PCR: Toxigenic C. Difficile by PCR: NEGATIVE

## 2014-09-01 MED ORDER — EMTRICITABINE-TENOFOVIR DF 200-300 MG PO TABS
1.0000 | ORAL_TABLET | Freq: Every day | ORAL | Status: DC
  Administered 2014-09-01 – 2014-09-03 (×3): 1 via ORAL
  Filled 2014-09-01 (×3): qty 1

## 2014-09-01 MED ORDER — DOLUTEGRAVIR SODIUM 50 MG PO TABS
50.0000 mg | ORAL_TABLET | Freq: Every day | ORAL | Status: DC
  Administered 2014-09-01 – 2014-09-03 (×3): 50 mg via ORAL
  Filled 2014-09-01 (×3): qty 1

## 2014-09-01 MED ORDER — AZITHROMYCIN 600 MG PO TABS
1200.0000 mg | ORAL_TABLET | ORAL | Status: DC
  Administered 2014-09-01: 1200 mg via ORAL
  Filled 2014-09-01: qty 2

## 2014-09-01 MED ORDER — SODIUM CHLORIDE 0.9 % IV BOLUS (SEPSIS)
1000.0000 mL | INTRAVENOUS | Status: AC
  Administered 2014-09-01: 1000 mL via INTRAVENOUS

## 2014-09-01 MED ORDER — PREDNISONE 20 MG PO TABS
40.0000 mg | ORAL_TABLET | Freq: Two times a day (BID) | ORAL | Status: DC
  Administered 2014-09-01 – 2014-09-03 (×4): 40 mg via ORAL
  Filled 2014-09-01 (×4): qty 2

## 2014-09-01 NOTE — Progress Notes (Signed)
[facility] PROGRESS NOTE  Mason Meadows ZOX:[id number] DOB: 08/29/1975 DOA: 08/31/2014 PCP: Corky Downs, MD  Assessment/Plan: 1. Fever with chest pains 1. Initially placed on empiric vancomycin, cefepime, and bactrim 2. Respiratory culture pos for rhinovirus 3. Flu neg 4. Presently afebrile since transfer to floor from ED 5. Blood cx neg x 2 thus far 6. UA is normal 7. Appreciate input by ID 8. Pt is continued on azithromycin and bactrim 9. Steroids added to regimen 2. AIDS 1. CD4 and viral load pending 2. Had not been taking anti-virals prior to admit 3. Pt started back on antivirals per ID 4. Pt's social situation is very unfortunate, resulting in high risk of loss to follow up. 3. Pancytopenia 1. Likely secondary to HIV/AIDS 4. Polysubstance abuse 1. Cocaine positive 5. Tobacco abuse 1. Cessation done at bedside 6. DVT prophylaxis 1. lovenox  Code Status: Full Family Communication: Pt in room Disposition Plan: Pending   Consultants:  ID  Procedures:    Antibiotics:  Cefepime 4/15>>>4/16  Vancomycin 4/15>>>4/16  Bactrim 4/15>>>  Azithromycin 4/16>>>  HPI/Subjective: Feels "horrible" today. Complains of generalized weakness and soreness  Objective: Filed Vitals:   09/01/14 0216 09/01/14 0506 09/01/14 0951 09/01/14 1332  BP: 127/78 125/75 140/81 134/84  Pulse: 60 55 68 64  Temp: 99.2 F (37.3 C) 97.9 F (36.6 C) 98 F (36.7 C) 98.9 F (37.2 C)  TempSrc: Oral Oral Oral Oral  Resp: Height:      Weight:      SpO2: 98% 98% 98% 99%    Intake/Output Summary (Last 24 hours) at 09/01/14 1501 Last data filed at 09/01/14 1332  Gross per 24 hour  Intake    480 ml  Output      0 ml  Net    480 ml   Filed Weights   08/31/14 0042 08/31/14 0610  Weight: 83.915 kg (185 lb) 83.371 kg (183 lb 12.8 oz)    Exam:   General:  Asleep, easily arousable, in nad  Cardiovascular: regular, s1, s2  Respiratory: normal resp effort,  no crackles  Abdomen: soft,nondistended  Musculoskeletal: perfused, no clubbing   Data Reviewed: Basic Metabolic Panel:  Recent Labs Lab 08/31/14 0114 09/01/14 1135  NA 140 139  K 3.9 3.5  CL 103 109  CO2 26 21  GLUCOSE 89 85  BUN <5* 8  CREATININE 0.97 0.98  CALCIUM 8.6 8.1*   Liver Function Tests:  Recent Labs Lab 08/31/14 0114  AST 25  ALT 26  ALKPHOS 61  BILITOT 0.6  PROT 6.1  ALBUMIN 3.3*   No results for input(s): LIPASE, AMYLASE in the last 168 hours. No results for input(s): AMMONIA in the last 168 hours. CBC:  Recent Labs Lab 08/31/14 0102 08/31/14 0256 09/01/14 1135  WBC 2.3* 2.2* 3.4*  NEUTROABS  --  1.1*  --   HGB 13.6 12.8* 13.0  HCT 39.5 37.5* 38.6*  MCV 88.4 88.0 88.3  PLT 147* 141* 127*   Cardiac Enzymes: No results for input(s): CKTOTAL, CKMB, CKMBINDEX, TROPONINI in the last 168 hours. BNP (last 3 results) No results for input(s): BNP in the last 8760 hours.  ProBNP (last 3 results) No results for input(s): PROBNP in the last 8760 hours.  CBG: No results for input(s): GLUCAP in the last 168 hours.  Recent Results (from the past 240 hour(s))  Blood culture (routine x 2)     Status: None (Preliminary result)   Collection Time: 08/31/14  4:45 AM  Result Value Ref Range Status   Specimen Description BLOOD LEFT ARM  Final   Special Requests BOTTLES DRAWN AEROBIC ONLY 5CCS  Final   Culture   Final           BLOOD CULTURE RECEIVED NO GROWTH TO DATE CULTURE WILL BE HELD FOR 5 DAYS BEFORE ISSUING A FINAL NEGATIVE REPORT Performed at [facility]    Report Status PENDING  Incomplete  Blood culture (routine x 2)     Status: None (Preliminary result)   Collection Time: 08/31/14  4:48 AM  Result Value Ref Range Status   Specimen Description BLOOD LEFT HAND  Final   Special Requests BOTTLES DRAWN AEROBIC ONLY 5CCS  Final   Culture   Final           BLOOD CULTURE RECEIVED NO GROWTH TO DATE CULTURE WILL BE HELD FOR 5 DAYS BEFORE  ISSUING A FINAL NEGATIVE REPORT Performed at [facility]    Report Status PENDING  Incomplete  Respiratory virus panel     Status: Abnormal   Collection Time: 08/31/14  5:12 AM  Result Value Ref Range Status   Respiratory Syncytial Virus A Negative Negative Final   Respiratory Syncytial Virus B Negative Negative Final   Influenza A Negative Negative Final   Influenza B Negative Negative Final   Parainfluenza 1 Negative Negative Final   Parainfluenza 2 Negative Negative Final   Parainfluenza 3 Negative Negative Final   Metapneumovirus Negative Negative Final   Rhinovirus Positive (A) Negative Final   Adenovirus Negative Negative Final    Comment: (NOTE) Performed At: [facility] [address] [place], [place] [id number] Mila Homer MD WU:[id number]   Clostridium Difficile by PCR     Status: None   Collection Time: 08/31/14 11:07 PM  Result Value Ref Range Status   C difficile by pcr NEGATIVE NEGATIVE Final     Studies: Dg Chest 2 View (if Patient Has Fever And/or Copd)  08/31/2014   CLINICAL DATA:  Right flank and rib pain, symptoms for 1 week.  EXAM: CHEST  2 VIEW  COMPARISON:  06/14/2013  FINDINGS: Lung volumes are low with mild bibasilar atelectasis. The cardiomediastinal contours are normal. Pulmonary vasculature is normal. No consolidation, pleural effusion, or pneumothorax. No acute osseous abnormalities are seen.  IMPRESSION: Hypoventilatory chest with bibasilar atelectasis.   Electronically Signed   By: Rubye Oaks M.D.   On: 08/31/2014 02:44   Ct Angio Chest Pe W/cm &/or Wo Cm  08/31/2014   CLINICAL DATA:  Right-sided chest pain with inspiration  EXAM: CT ANGIOGRAPHY CHEST WITH CONTRAST  TECHNIQUE: Multidetector CT imaging of the chest was performed using the standard protocol during bolus administration of intravenous contrast. Multiplanar CT image reconstructions and MIPs were obtained to evaluate the vascular anatomy.  CONTRAST:  75mL OMNIPAQUE  IOHEXOL 350 MG/ML SOLN  COMPARISON:  None.  FINDINGS: THORACIC INLET/BODY WALL:  No acute abnormality.  MEDIASTINUM:  Normal heart size. No pericardial effusion. No acute vascular abnormality. Borderline diffuse esophageal thickening. CTA of the pulmonary arteries is limited by motion, especially at the bases, but exam is overall diagnostic and negative for pulmonary embolism. No aortic aneurysm or dissection.  LUNG WINDOWS:  Central bronchial wall thickening with intermittent mucoid impaction. There is no focal pneumonia. No edema, effusion, or pneumothorax. No suspicious ground-glass density or cystic change in this patient with history presumed pneumocystis pneumonia. There are few scattered pulmonary nodules, with the larger in the left upper lobe on  image 26 measuring 6 mm.  UPPER ABDOMEN:  No acute findings.  OSSEOUS:  Remote T3, T4, T5, T6, and T7 superior endplate/compression fractures with exaggerated thoracic kyphosis. Height loss is worst at T4, approximately 75%. There is bilateral foraminal narrowing at T4-5 from disc loss and facet fusion with bony overgrowth.  Review of the MIP images confirms the above findings.  IMPRESSION: 1. No evidence of pulmonary embolism. 2. Bronchitis. 3. 6 mm left upper lobe pulmonary nodule. Given smoking history, follow-up chest CT at 6 - 12 months is recommended. This recommendation follows the consensus statement: Guidelines for Management of Small Pulmonary Nodules Detected on CT Scans: A Statement from the Fleischner Society as published in Radiology 2005;237:395-400. 4. Remote compression/superior endplate fractures of T3 through T7 with exaggerated kyphosis.   Electronically Signed   By: Marnee SpringJonathon  Watts M.D.   On: 08/31/2014 06:25    Scheduled Meds: . azithromycin  1,200 mg Oral Weekly  . dolutegravir  50 mg Oral Daily  . emtricitabine-tenofovir  1 tablet Oral Daily  . enoxaparin (LOVENOX) injection  40 mg Subcutaneous Daily  . predniSONE  40 mg Oral BID WC   . sulfamethoxazole-trimethoprim  420 mg Intravenous 3 times per day   Continuous Infusions:    Principal Problem:   Fever Active Problems:   Chest pain   Pancytopenia   HIV (human immunodeficiency virus infection)   PCP (pneumocystis carinii pneumonia)   Noncompliance    CHIU, STEPHEN K  [facility] Pager [phone]. If 7PM-7AM, please contact night-coverage at [email or web address], password [email or web address] 09/01/2014, 3:01 PM  LOS: 1 day

## 2014-09-01 NOTE — Consult Note (Addendum)
[place] for Infectious Disease    Date of Admission:  08/31/2014  Date of Consult:  09/01/2014  Reason for Consult: HIV/AIDS Referring Physician: Dr. Wyline Copas   HPI: Mason Meadows is an 39 y.o. male with HIV/ AIDS recently admitted to [facility]. And dc. He cannot tell me a very accurate list of meds and EPIC interface did not work   I called Illene Silver, MD and patient had been admitted to [facility] on March 28 after having been seen at [facility]. His CT scan has shown groundglass opacities concerning for possible PCP pneumonia. His PCP smear was negative but he was treated for both PCP and possible commute acquired pneumonia and was hospitalized March 28 April 5. He received ceftriaxone for possible Mucor pneumonia and received high-dose Bactrim with prednisone. It appears the patient stopped all these medicines after he left the hospital.   They had started him on TIvicay and Truvada at [facility]  (of note he was supposed to have been seen in our clinic at urgent request of [facility] but appt was for 09/06/14.  He was admitted here with chest pain and found on CT scan to have evidence of possible bronchitis but not the kind of findings were described with weight of March.  It appears that here he has already been started on treatment for PCP and CAP yet again.  Past Medical History  Diagnosis Date  . Pneumonia   . Anxiety   . AIDS (acquired immune deficiency syndrome)     Past Surgical History  Procedure Laterality Date  . Back surgery    . Total hip arthroplasty    ergies:   Allergies  Allergen Reactions  . Amoxicillin Hives  . Ketorolac Hives  . Penicillins Hives  . Tape     Blisters come up wherever tape is.  . Tramadol     Seizures     Medications: I have reviewed patients current medications as documented in Epic Anti-infectives    Start     Dose/Rate Route Frequency Ordered Stop   08/31/14 1400  vancomycin (VANCOCIN) IVPB 1000 mg/200 mL  premix     1,000 mg 200 mL/hr over 60 Minutes Intravenous Every 8 hours 08/31/14 0624     08/31/14 1000  sulfamethoxazole-trimethoprim (BACTRIM) 420 mg in dextrose 5 % 500 mL IVPB     420 mg 350.8 mL/hr over 90 Minutes Intravenous 3 times per day 08/31/14 0624     08/31/14 0500  ceFEPIme (MAXIPIME) 2 g in dextrose 5 % 50 mL IVPB     2 g 100 mL/hr over 30 Minutes Intravenous 3 times per day 08/31/14 0446     08/31/14 0430  vancomycin (VANCOCIN) 1,500 mg in sodium chloride 0.9 % 500 mL IVPB     1,500 mg 250 mL/hr over 120 Minutes Intravenous  Once 08/31/14 0421 08/31/14 0719   08/31/14 0430  sulfamethoxazole-trimethoprim (BACTRIM DS,SEPTRA DS) 800-160 MG per tablet 1 tablet     1 tablet Oral  Once 08/31/14 0426 08/31/14 0450      Social History:  reports that he quit smoking about 15 months ago. His smoking use included Cigarettes. He has a 10 pack-year smoking history. He has never used smokeless tobacco. He reports that he drinks about 0.6 oz of alcohol per week. He reports that he does not use illicit drugs.  Family History  Problem Relation Age of Onset  . Hypertension Mother   . Diabetes Mellitus II Mother   .  Hypertension Father   . Diabetes Mellitus II Father     As in HPI and primary teams notes otherwise 12 point review of systems is negative  Blood pressure 140/81, pulse 68, temperature 98 F (36.7 C), temperature source Oral, resp. rate 20, height $RemoveBe'5\' 9"'UzNSLlhtD$  (1.753 m), weight 183 lb 12.8 oz (83.371 kg), SpO2 98 %. General: Alert and awake, oriented x3, not in any acute distress. HEENT: anicteric sclera, pupils reactive to light and accommodation, EOMI, oropharynx clear and without exudate CVS regular rate, normal r,  no murmur rubs or gallops Chest: clear to auscultation bilaterally, no wheezing, rales or rhonchi Abdomen: soft nontender, nondistended, normal bowel sounds, Extremities: no  clubbing or edema noted bilaterally Skin: no rashes Neuro: nonfocal, strength and  sensation intact   Results for orders placed or performed during the hospital encounter of 08/31/14 (from the past 48 hour(s))  CBC     (if pt has PMH of COPD)     Status: Abnormal   Collection Time: 08/31/14  1:02 AM  Result Value Ref Range   WBC 2.3 (L) 4.0 - 10.5 K/uL   RBC 4.47 4.22 - 5.81 MIL/uL   Hemoglobin 13.6 13.0 - 17.0 g/dL   HCT 39.5 39.0 - 52.0 %   MCV 88.4 78.0 - 100.0 fL   MCH 30.4 26.0 - 34.0 pg   MCHC 34.4 30.0 - 36.0 g/dL   RDW 15.4 11.5 - 15.5 %   Platelets 147 (L) 150 - 400 K/uL  I-stat troponin, ED (if patient has history of COPD)     Status: None   Collection Time: 08/31/14  1:12 AM  Result Value Ref Range   Troponin i, poc 0.00 0.00 - 0.08 ng/mL   Comment 3            Comment: Due to the release kinetics of cTnI, a negative result within the first hours of the onset of symptoms does not rule out myocardial infarction with certainty. If myocardial infarction is still suspected, repeat the test at appropriate intervals.   Comprehensive metabolic panel     Status: Abnormal   Collection Time: 08/31/14  1:14 AM  Result Value Ref Range   Sodium 140 135 - 145 mmol/L   Potassium 3.9 3.5 - 5.1 mmol/L   Chloride 103 96 - 112 mmol/L   CO2 26 19 - 32 mmol/L   Glucose, Bld 89 70 - 99 mg/dL   BUN <5 (L) 6 - 23 mg/dL   Creatinine, Ser 0.97 0.50 - 1.35 mg/dL   Calcium 8.6 8.4 - 10.5 mg/dL   Total Protein 6.1 6.0 - 8.3 g/dL   Albumin 3.3 (L) 3.5 - 5.2 g/dL   AST 25 0 - 37 U/L   ALT 26 0 - 53 U/L   Alkaline Phosphatase 61 39 - 117 U/L   Total Bilirubin 0.6 0.3 - 1.2 mg/dL   GFR calc non Af Amer >90 >90 mL/min   GFR calc Af Amer >90 >90 mL/min    Comment: (NOTE) The eGFR has been calculated using the CKD EPI equation. This calculation has not been validated in all clinical situations. eGFR's persistently <90 mL/min signify possible Chronic Kidney Disease.    Anion gap 11 5 - 15  CBC with Differential     Status: Abnormal   Collection Time: 08/31/14  2:56 AM    Result Value Ref Range   WBC 2.2 (L) 4.0 - 10.5 K/uL   RBC 4.26 4.22 - 5.81 MIL/uL   Hemoglobin  12.8 (L) 13.0 - 17.0 g/dL   HCT 37.5 (L) 39.0 - 52.0 %   MCV 88.0 78.0 - 100.0 fL   MCH 30.0 26.0 - 34.0 pg   MCHC 34.1 30.0 - 36.0 g/dL   RDW 15.3 11.5 - 15.5 %   Platelets 141 (L) 150 - 400 K/uL   Neutrophils Relative % 51 43 - 77 %   Neutro Abs 1.1 (L) 1.7 - 7.7 K/uL   Lymphocytes Relative 36 12 - 46 %   Lymphs Abs 0.8 0.7 - 4.0 K/uL   Monocytes Relative 10 3 - 12 %   Monocytes Absolute 0.2 0.1 - 1.0 K/uL   Eosinophils Relative 2 0 - 5 %   Eosinophils Absolute 0.0 0.0 - 0.7 K/uL   Basophils Relative 1 0 - 1 %   Basophils Absolute 0.0 0.0 - 0.1 K/uL  Lactate dehydrogenase     Status: None   Collection Time: 08/31/14  4:43 AM  Result Value Ref Range   LDH 158 94 - 250 U/L  Blood culture (routine x 2)     Status: None (Preliminary result)   Collection Time: 08/31/14  4:45 AM  Result Value Ref Range   Specimen Description BLOOD LEFT ARM    Special Requests BOTTLES DRAWN AEROBIC ONLY 5CCS    Culture             BLOOD CULTURE RECEIVED NO GROWTH TO DATE CULTURE WILL BE HELD FOR 5 DAYS BEFORE ISSUING A FINAL NEGATIVE REPORT Performed at [facility]    Report Status PENDING   Blood culture (routine x 2)     Status: None (Preliminary result)   Collection Time: 08/31/14  4:48 AM  Result Value Ref Range   Specimen Description BLOOD LEFT HAND    Special Requests BOTTLES DRAWN AEROBIC ONLY 5CCS    Culture             BLOOD CULTURE RECEIVED NO GROWTH TO DATE CULTURE WILL BE HELD FOR 5 DAYS BEFORE ISSUING A FINAL NEGATIVE REPORT Performed at [facility]    Report Status PENDING   Respiratory virus panel     Status: Abnormal   Collection Time: 08/31/14  5:12 AM  Result Value Ref Range   Respiratory Syncytial Virus A Negative Negative   Respiratory Syncytial Virus B Negative Negative   Influenza A Negative Negative   Influenza B Negative Negative   Parainfluenza 1  Negative Negative   Parainfluenza 2 Negative Negative   Parainfluenza 3 Negative Negative   Metapneumovirus Negative Negative   Rhinovirus Positive (A) Negative   Adenovirus Negative Negative    Comment: (NOTE) Performed At: [facility] [place], [place] [id number] Lindon Romp MD [id number]   Urine rapid drug screen (hosp performed)     Status: Abnormal   Collection Time: 08/31/14  9:52 AM  Result Value Ref Range   Opiates NONE DETECTED NONE DETECTED   Cocaine POSITIVE (A) NONE DETECTED   Benzodiazepines NONE DETECTED NONE DETECTED   Amphetamines NONE DETECTED NONE DETECTED   Tetrahydrocannabinol NONE DETECTED NONE DETECTED   Barbiturates NONE DETECTED NONE DETECTED    Comment:        DRUG SCREEN FOR MEDICAL PURPOSES ONLY.  IF CONFIRMATION IS NEEDED FOR ANY PURPOSE, NOTIFY LAB WITHIN 5 DAYS.        LOWEST DETECTABLE LIMITS FOR URINE DRUG SCREEN Drug Class       Cutoff (ng/mL) Amphetamine      1000 Barbiturate  200 Benzodiazepine   435 Tricyclics       686 Opiates          300 Cocaine          300 THC              50   Influenza panel by PCR (type A & B, H1N1)     Status: None   Collection Time: 08/31/14  5:29 PM  Result Value Ref Range   Influenza A By PCR NEGATIVE NEGATIVE   Influenza B By PCR NEGATIVE NEGATIVE   H1N1 flu by pcr NOT DETECTED NOT DETECTED    Comment:        The Xpert Flu assay (FDA approved for nasal aspirates or washes and nasopharyngeal swab specimens), is intended as an aid in the diagnosis of influenza and should not be used as a sole basis for treatment.   Urinalysis, Routine w reflex microscopic     Status: Abnormal   Collection Time: 08/31/14  5:55 PM  Result Value Ref Range   Color, Urine YELLOW YELLOW   APPearance CLEAR CLEAR   Specific Gravity, Urine 1.013 1.005 - 1.030   pH 7.5 5.0 - 8.0   Glucose, UA 100 (A) NEGATIVE mg/dL   Hgb urine dipstick NEGATIVE NEGATIVE   Bilirubin Urine NEGATIVE  NEGATIVE   Ketones, ur NEGATIVE NEGATIVE mg/dL   Protein, ur NEGATIVE NEGATIVE mg/dL   Urobilinogen, UA 0.2 0.0 - 1.0 mg/dL   Nitrite NEGATIVE NEGATIVE   Leukocytes, UA NEGATIVE NEGATIVE    Comment: MICROSCOPIC NOT DONE ON URINES WITH NEGATIVE PROTEIN, BLOOD, LEUKOCYTES, NITRITE, OR GLUCOSE <1000 mg/dL.  Clostridium Difficile by PCR     Status: None   Collection Time: 08/31/14 11:07 PM  Result Value Ref Range   C difficile by pcr NEGATIVE NEGATIVE  Basic metabolic panel     Status: Abnormal   Collection Time: 09/01/14 11:35 AM  Result Value Ref Range   Sodium 139 135 - 145 mmol/L   Potassium 3.5 3.5 - 5.1 mmol/L   Chloride 109 96 - 112 mmol/L   CO2 21 19 - 32 mmol/L   Glucose, Bld 85 70 - 99 mg/dL   BUN 8 6 - 23 mg/dL   Creatinine, Ser 0.98 0.50 - 1.35 mg/dL   Calcium 8.1 (L) 8.4 - 10.5 mg/dL   GFR calc non Af Amer >90 >90 mL/min   GFR calc Af Amer >90 >90 mL/min    Comment: (NOTE) The eGFR has been calculated using the CKD EPI equation. This calculation has not been validated in all clinical situations. eGFR's persistently <90 mL/min signify possible Chronic Kidney Disease.    Anion gap 9 5 - 15  CBC     Status: Abnormal   Collection Time: 09/01/14 11:35 AM  Result Value Ref Range   WBC 3.4 (L) 4.0 - 10.5 K/uL   RBC 4.37 4.22 - 5.81 MIL/uL   Hemoglobin 13.0 13.0 - 17.0 g/dL   HCT 38.6 (L) 39.0 - 52.0 %   MCV 88.3 78.0 - 100.0 fL   MCH 29.7 26.0 - 34.0 pg   MCHC 33.7 30.0 - 36.0 g/dL   RDW 15.3 11.5 - 15.5 %   Platelets 127 (L) 150 - 400 K/uL   '@BRIEFLABTABLE'$ (sdes,specrequest,cult,reptstatus)   ) Recent Results (from the past 720 hour(s))  Blood culture (routine x 2)     Status: None (Preliminary result)   Collection Time: 08/31/14  4:45 AM  Result Value Ref Range Status  Specimen Description BLOOD LEFT ARM  Final   Special Requests BOTTLES DRAWN AEROBIC ONLY 5CCS  Final   Culture   Final           BLOOD CULTURE RECEIVED NO GROWTH TO DATE CULTURE WILL BE HELD  FOR 5 DAYS BEFORE ISSUING A FINAL NEGATIVE REPORT Performed at [facility]    Report Status PENDING  Incomplete  Blood culture (routine x 2)     Status: None (Preliminary result)   Collection Time: 08/31/14  4:48 AM  Result Value Ref Range Status   Specimen Description BLOOD LEFT HAND  Final   Special Requests BOTTLES DRAWN AEROBIC ONLY 5CCS  Final   Culture   Final           BLOOD CULTURE RECEIVED NO GROWTH TO DATE CULTURE WILL BE HELD FOR 5 DAYS BEFORE ISSUING A FINAL NEGATIVE REPORT Performed at [facility]    Report Status PENDING  Incomplete  Respiratory virus panel     Status: Abnormal   Collection Time: 08/31/14  5:12 AM  Result Value Ref Range Status   Respiratory Syncytial Virus A Negative Negative Final   Respiratory Syncytial Virus B Negative Negative Final   Influenza A Negative Negative Final   Influenza B Negative Negative Final   Parainfluenza 1 Negative Negative Final   Parainfluenza 2 Negative Negative Final   Parainfluenza 3 Negative Negative Final   Metapneumovirus Negative Negative Final   Rhinovirus Positive (A) Negative Final   Adenovirus Negative Negative Final    Comment: (NOTE) Performed At: [facility] [address] [place], [place] [id number] Lindon Romp MD [id number]   Clostridium Difficile by PCR     Status: None   Collection Time: 08/31/14 11:07 PM  Result Value Ref Range Status   C difficile by pcr NEGATIVE NEGATIVE Final     Impression/Recommendation  Principal Problem:   Fever Active Problems:   Chest pain   Pancytopenia   HIV (human immunodeficiency virus infection)   Mason Meadows is a 39 y.o. male with  HIV/AIDS: possibly partially treated PCP vs bronchitis that responded to steroids. He is not compliant with ARV and has very poor insight  #1 HIV/AIDS:  I will started back on TIVICAY and Truvada  Will continue azithromycin for opportunistic infection prophylaxis  #2 question partially  treated PCP pneumonia:  Since he has already been started on Bactrim by Primary teaw will continue it and add steroids  Will dc anti-CAP abx  I spent greater than 70  minutes with the patient including greater than 50% of time in face to face counsel of the patient re his HIV and in coordination of their care with [facility]     09/01/2014, 1:25 PM   Thank you so much for this interesting consult  [place] [facility] [place] (pager) [phone] (office) 09/01/2014, 1:25 PM  [place] 09/01/2014, 1:25 PM

## 2014-09-02 DIAGNOSIS — Z9114 Patient's other noncompliance with medication regimen: Secondary | ICD-10-CM

## 2014-09-02 DIAGNOSIS — B370 Candidal stomatitis: Secondary | ICD-10-CM | POA: Insufficient documentation

## 2014-09-02 DIAGNOSIS — J989 Respiratory disorder, unspecified: Secondary | ICD-10-CM

## 2014-09-02 DIAGNOSIS — B20 Human immunodeficiency virus [HIV] disease: Principal | ICD-10-CM

## 2014-09-02 DIAGNOSIS — D709 Neutropenia, unspecified: Secondary | ICD-10-CM

## 2014-09-02 DIAGNOSIS — F489 Nonpsychotic mental disorder, unspecified: Secondary | ICD-10-CM

## 2014-09-02 LAB — CBC
HCT: 40.6 % (ref 39.0–52.0)
HEMOGLOBIN: 14.4 g/dL (ref 13.0–17.0)
MCH: 30.9 pg (ref 26.0–34.0)
MCHC: 35.5 g/dL (ref 30.0–36.0)
MCV: 87.1 fL (ref 78.0–100.0)
Platelets: 131 10*3/uL — ABNORMAL LOW (ref 150–400)
RBC: 4.66 MIL/uL (ref 4.22–5.81)
RDW: 15.9 % — ABNORMAL HIGH (ref 11.5–15.5)
WBC: 6.1 10*3/uL (ref 4.0–10.5)

## 2014-09-02 LAB — BASIC METABOLIC PANEL
Anion gap: 11 (ref 5–15)
BUN: 6 mg/dL (ref 6–23)
CALCIUM: 8.4 mg/dL (ref 8.4–10.5)
CO2: 18 mmol/L — ABNORMAL LOW (ref 19–32)
CREATININE: 0.79 mg/dL (ref 0.50–1.35)
Chloride: 107 mmol/L (ref 96–112)
Glucose, Bld: 95 mg/dL (ref 70–99)
POTASSIUM: 4.5 mmol/L (ref 3.5–5.1)
Sodium: 136 mmol/L (ref 135–145)

## 2014-09-02 MED ORDER — MAGNESIUM CITRATE PO SOLN
1.0000 | Freq: Once | ORAL | Status: DC
  Filled 2014-09-02: qty 296

## 2014-09-02 MED ORDER — BISACODYL 10 MG RE SUPP
10.0000 mg | Freq: Once | RECTAL | Status: DC
  Filled 2014-09-02: qty 1

## 2014-09-02 MED ORDER — FLUCONAZOLE 100 MG PO TABS
200.0000 mg | ORAL_TABLET | Freq: Every day | ORAL | Status: DC
  Administered 2014-09-02 – 2014-09-03 (×2): 200 mg via ORAL
  Filled 2014-09-02 (×2): qty 2

## 2014-09-02 NOTE — Progress Notes (Signed)
[facility] PROGRESS NOTE  Deshane Cotroneo WJX:[id number] DOB: 08/24/1975 DOA: 08/31/2014 PCP: Corky Downs, MD  Assessment/Plan: 1. Fever with chest pains 1. Initially placed on empiric vancomycin, cefepime, and bactrim 2. Respiratory culture pos for rhinovirus 3. Flu neg 4. Presently afebrile 5. Blood cx neg x 2 thus far 6. UA is normal 7. Appreciate input by ID 8. Pt is continued on azithromycin and bactrim with steroids 9. No leukocytosis 2. AIDS 1. CD4 and viral load still pending. Of note, CD4 of 10 with VL of 29k in 2015 2. Had not been taking anti-virals prior to admit 3. Pt started back on antivirals per ID on 4/16 4. When asked about plans to f/u with ID, pt states, "I guess I'll just try my best, but can't promise anything." 5. Very strong chance patient will once again be lost to follow up. Per ID, will ask Mitch McGEE or Cassandra Hobart from [facility] evaluate for [employer] 3. Pancytopenia 1. Likely secondary to HIV/AIDS 2. Improved 4. Polysubstance abuse 1. Cocaine positive 5. Tobacco abuse 1. Cessation done at bedside 6. DVT prophylaxis 1. lovenox  Code Status: Full Family Communication: Pt in room Disposition Plan: Pending   Consultants:  ID  Procedures:    Antibiotics:  Cefepime 4/15>>>4/16  Vancomycin 4/15>>>4/16  Bactrim 4/15>>>  Azithromycin 4/16>>>  HPI/Subjective: Feels better today  Objective: Filed Vitals:   09/02/14 0213 09/02/14 0616 09/02/14 1050 09/02/14 1448  BP: 120/78 125/84 116/88 131/77  Pulse: 68 78 84 83  Temp: 98.3 F (36.8 C) 98.5 F (36.9 C) 99 F (37.2 C) 98.5 F (36.9 C)  TempSrc: Oral Oral Oral Oral  Resp: Height:      Weight:      SpO2: 99% 98% 98% 97%    Intake/Output Summary (Last 24 hours) at 09/02/14 1502 Last data filed at 09/01/14 1808  Gross per 24 hour  Intake    480 ml  Output      0 ml  Net    480 ml   Filed Weights   08/31/14 0042 08/31/14 0610  Weight:  83.915 kg (185 lb) 83.371 kg (183 lb 12.8 oz)    Exam:   General:  Awake, sitting in bed eating lunch, in nad  Cardiovascular: regular, s1, s2  Respiratory: normal resp effort, no wheezing  Abdomen: soft,nondistended  Musculoskeletal: perfused, no clubbing   Data Reviewed: Basic Metabolic Panel:  Recent Labs Lab 08/31/14 0114 09/01/14 1135 09/02/14 0630  NA 140 139 136  K 3.9 3.5 4.5  CL 103 109 107  CO2 26 21 18*  GLUCOSE 89 85 95  BUN <5* 8 6  CREATININE 0.97 0.98 0.79  CALCIUM 8.6 8.1* 8.4   Liver Function Tests:  Recent Labs Lab 08/31/14 0114  AST 25  ALT 26  ALKPHOS 61  BILITOT 0.6  PROT 6.1  ALBUMIN 3.3*   No results for input(s): LIPASE, AMYLASE in the last 168 hours. No results for input(s): AMMONIA in the last 168 hours. CBC:  Recent Labs Lab 08/31/14 0102 08/31/14 0256 09/01/14 1135 09/02/14 0630  WBC 2.3* 2.2* 3.4* 6.1  NEUTROABS  --  1.1*  --   --   HGB 13.6 12.8* 13.0 14.4  HCT 39.5 37.5* 38.6* 40.6  MCV 88.4 88.0 88.3 87.1  PLT 147* 141* 127* 131*   Cardiac Enzymes: No results for input(s): CKTOTAL, CKMB, CKMBINDEX, TROPONINI in the last 168 hours. BNP (last 3 results) No results for input(s): BNP in the  last 8760 hours.  ProBNP (last 3 results) No results for input(s): PROBNP in the last 8760 hours.  CBG: No results for input(s): GLUCAP in the last 168 hours.  Recent Results (from the past 240 hour(s))  Blood culture (routine x 2)     Status: None (Preliminary result)   Collection Time: 08/31/14  4:45 AM  Result Value Ref Range Status   Specimen Description BLOOD LEFT ARM  Final   Special Requests BOTTLES DRAWN AEROBIC ONLY 5CCS  Final   Culture   Final           BLOOD CULTURE RECEIVED NO GROWTH TO DATE CULTURE WILL BE HELD FOR 5 DAYS BEFORE ISSUING A FINAL NEGATIVE REPORT Performed at [facility]    Report Status PENDING  Incomplete  Blood culture (routine x 2)     Status: None (Preliminary result)    Collection Time: 08/31/14  4:48 AM  Result Value Ref Range Status   Specimen Description BLOOD LEFT HAND  Final   Special Requests BOTTLES DRAWN AEROBIC ONLY 5CCS  Final   Culture   Final           BLOOD CULTURE RECEIVED NO GROWTH TO DATE CULTURE WILL BE HELD FOR 5 DAYS BEFORE ISSUING A FINAL NEGATIVE REPORT Performed at [facility]    Report Status PENDING  Incomplete  Respiratory virus panel     Status: Abnormal   Collection Time: 08/31/14  5:12 AM  Result Value Ref Range Status   Respiratory Syncytial Virus A Negative Negative Final   Respiratory Syncytial Virus B Negative Negative Final   Influenza A Negative Negative Final   Influenza B Negative Negative Final   Parainfluenza 1 Negative Negative Final   Parainfluenza 2 Negative Negative Final   Parainfluenza 3 Negative Negative Final   Metapneumovirus Negative Negative Final   Rhinovirus Positive (A) Negative Final   Adenovirus Negative Negative Final    Comment: (NOTE) Performed At: [facility] [address] [place], [place] [id number] Mila HomerHancock William F MD WU:[id number]:[phone]   Clostridium Difficile by PCR     Status: None   Collection Time: 08/31/14 11:07 PM  Result Value Ref Range Status   C difficile by pcr NEGATIVE NEGATIVE Final     Studies: No results found.  Scheduled Meds: . azithromycin  1,200 mg Oral Weekly  . bisacodyl  10 mg Rectal Once  . dolutegravir  50 mg Oral Daily  . emtricitabine-tenofovir  1 tablet Oral Daily  . enoxaparin (LOVENOX) injection  40 mg Subcutaneous Daily  . fluconazole  200 mg Oral Daily  . magnesium citrate  1 Bottle Oral Once  . predniSONE  40 mg Oral BID WC  . sulfamethoxazole-trimethoprim  420 mg Intravenous 3 times per day   Continuous Infusions:    Principal Problem:   Fever Active Problems:   Chest pain   Pancytopenia   HIV (human immunodeficiency virus infection)   PCP (pneumocystis carinii pneumonia)   Noncompliance   Thrush    Dillen Belmontes  K  [facility] Pager [phone]. If 7PM-7AM, please contact night-coverage at [email or web address], password [email or web address] 09/02/2014, 3:02 PM  LOS: 2 days

## 2014-09-02 NOTE — Progress Notes (Addendum)
[facility]    Subjective: Complaining of blurry vision and dysphagia   Antibiotics:  Anti-infectives    Start     Dose/Rate Route Frequency Ordered Stop   09/01/14 1400  dolutegravir (TIVICAY) tablet 50 mg     50 mg Oral Daily 09/01/14 1349     09/01/14 1400  emtricitabine-tenofovir (TRUVADA) 200-300 MG per tablet 1 tablet     1 tablet Oral Daily 09/01/14 1349     09/01/14 1400  azithromycin (ZITHROMAX) tablet 1,200 mg     1,200 mg Oral Weekly 09/01/14 1349     08/31/14 1400  vancomycin (VANCOCIN) IVPB 1000 mg/200 mL premix  Status:  Discontinued     1,000 mg 200 mL/hr over 60 Minutes Intravenous Every 8 hours 08/31/14 0624 09/01/14 1347   08/31/14 1000  sulfamethoxazole-trimethoprim (BACTRIM) 420 mg in dextrose 5 % 500 mL IVPB     420 mg 350.8 mL/hr over 90 Minutes Intravenous 3 times per day 08/31/14 0624     08/31/14 0500  ceFEPIme (MAXIPIME) 2 g in dextrose 5 % 50 mL IVPB  Status:  Discontinued     2 g 100 mL/hr over 30 Minutes Intravenous 3 times per day 08/31/14 0446 09/01/14 1347   08/31/14 0430  vancomycin (VANCOCIN) 1,500 mg in sodium chloride 0.9 % 500 mL IVPB     1,500 mg 250 mL/hr over 120 Minutes Intravenous  Once 08/31/14 0421 08/31/14 0719   08/31/14 0430  sulfamethoxazole-trimethoprim (BACTRIM DS,SEPTRA DS) 800-160 MG per tablet 1 tablet     1 tablet Oral  Once 08/31/14 0426 08/31/14 0450      Medications: Scheduled Meds: . azithromycin  1,200 mg Oral Weekly  . bisacodyl  10 mg Rectal Once  . dolutegravir  50 mg Oral Daily  . emtricitabine-tenofovir  1 tablet Oral Daily  . enoxaparin (LOVENOX) injection  40 mg Subcutaneous Daily  . magnesium citrate  1 Bottle Oral Once  . predniSONE  40 mg Oral BID WC  . sulfamethoxazole-trimethoprim  420 mg Intravenous 3 times per day   Continuous Infusions:  PRN Meds:.acetaminophen **OR** acetaminophen, albuterol, cyclobenzaprine, diphenhydrAMINE-zinc acetate, ondansetron **OR**  ondansetron (ZOFRAN) IV    Objective: Weight change:   Intake/Output Summary (Last 24 hours) at 09/02/14 1425 Last data filed at 09/01/14 1808  Gross per 24 hour  Intake    480 ml  Output      0 ml  Net    480 ml   Blood pressure 116/88, pulse 84, temperature 99 F (37.2 C), temperature source Oral, resp. rate 20, height  (1.753 m), weight 183 lb 12.8 oz (83.371 kg), SpO2 98 %. Temp:  [98.3 F (36.8 C)-99 F (37.2 C)] 99 F (37.2 C) (04/17 1050) Pulse Rate:  [68-84] 84 (04/17 1050) Resp:  [20] 20 (04/17 1050) BP: (116-141)/(78-88) 116/88 mmHg (04/17 1050) SpO2:  [98 %-99 %] 98 % (04/17 1050)  Physical Exam: General: Alert and awake, oriented x3, not in any acute distress. HEENT: anicteric sclera,EOMI, oropharynx with thrush CVS regular rate, normal r, no murmur rubs or gallops Chest: clear to auscultation bilaterally, no wheezing, rales or rhonchi Abdomen: soft nontender, nondistended, normal bowel sounds, Extremities: no clubbing or edema noted bilaterally Skin: no rashes Neuro: nonfocal, strength and sensation intact  CBC: CBC Latest Ref Rng 09/02/2014 09/01/2014 08/31/2014  WBC 4.0 - 10.5 K/uL 6.1 3.4(L) 2.2(L)  Hemoglobin 13.0 - 17.0 g/dL 69.6 29.5 12.8(L)  Hematocrit 39.0 - 52.0 % 40.6 38.6(L) 37.5(L)  Platelets 150 - 400 K/uL 131(L) 127(L) 141(L)       BMET  Recent Labs  09/01/14 1135 09/02/14 0630  NA 139 136  K 3.5 4.5  CL 109 107  CO2 21 18*  GLUCOSE 85 95  BUN 8 6  CREATININE 0.98 0.79  CALCIUM 8.1* 8.4     Liver Panel   Recent Labs  08/31/14 0114  PROT 6.1  ALBUMIN 3.3*  AST 25  ALT 26  ALKPHOS 61  BILITOT 0.6       Sedimentation Rate No results for input(s): ESRSEDRATE in the last 72 hours. C-Reactive Protein No results for input(s): CRP in the last 72 hours.  Micro Results: Recent Results (from the past 720 hour(s))  Blood culture (routine x 2)     Status: None (Preliminary result)   Collection Time: 08/31/14   4:45 AM  Result Value Ref Range Status   Specimen Description BLOOD LEFT ARM  Final   Special Requests BOTTLES DRAWN AEROBIC ONLY 5CCS  Final   Culture   Final           BLOOD CULTURE RECEIVED NO GROWTH TO DATE CULTURE WILL BE HELD FOR 5 DAYS BEFORE ISSUING A FINAL NEGATIVE REPORT Performed at [facility]    Report Status PENDING  Incomplete  Blood culture (routine x 2)     Status: None (Preliminary result)   Collection Time: 08/31/14  4:48 AM  Result Value Ref Range Status   Specimen Description BLOOD LEFT HAND  Final   Special Requests BOTTLES DRAWN AEROBIC ONLY 5CCS  Final   Culture   Final           BLOOD CULTURE RECEIVED NO GROWTH TO DATE CULTURE WILL BE HELD FOR 5 DAYS BEFORE ISSUING A FINAL NEGATIVE REPORT Performed at [facility]    Report Status PENDING  Incomplete  Respiratory virus panel     Status: Abnormal   Collection Time: 08/31/14  5:12 AM  Result Value Ref Range Status   Respiratory Syncytial Virus A Negative Negative Final   Respiratory Syncytial Virus B Negative Negative Final   Influenza A Negative Negative Final   Influenza B Negative Negative Final   Parainfluenza 1 Negative Negative Final   Parainfluenza 2 Negative Negative Final   Parainfluenza 3 Negative Negative Final   Metapneumovirus Negative Negative Final   Rhinovirus Positive (A) Negative Final   Adenovirus Negative Negative Final    Comment: (NOTE) Performed At: [facility] [address] [place], [place] [id number] Mila Homer MD WU:[id number]   Clostridium Difficile by PCR     Status: None   Collection Time: 08/31/14 11:07 PM  Result Value Ref Range Status   C difficile by pcr NEGATIVE NEGATIVE Final    Studies/Results: No results found.    Assessment/Plan:  Principal Problem:   Fever Active Problems:   Chest pain   Pancytopenia   HIV (human immunodeficiency virus infection)   PCP (pneumocystis carinii pneumonia)    Noncompliance    Mason Meadows is a 39 y.o. male  with HIV/AIDS: possibly partially treated PCP vs bronchitis that responded to steroids. He is not compliant with ARV and has very poor insight  #1 HIV/AIDS:   started back on TIVICAY and Truvada  Will continue azithromycin for opportunistic infection prophylaxis  I WOULD ASK MITCH MCGEE OR CASSANDRA [place] FROM [facility] TO COME SEE PT AS HE CLEARLY IS A PATIENT WHO WOULD BENEFIT FROM [facility]  I DO NOT KNOW  IF HE NEEDS SPAP FOR ARV COVERAGE.  WILL ALSO CC MINH PHAM ON THIS NOTE RE OBTAINING ARV POST DC  He is VERY VERY HIGH RISK TO NOT show for clinic and fall out of care and be readmitted again and again, insight is VERY POOR  #2 question partially treated PCP pneumonia:  Since he has already been started on Bactrim by Primary teaw will continue it and add steroids  #3 Thrush: start fluconazole  Dr. Luciana Axeomer picking up service tomorrow.   LOS: 2 days   Paulette Blanchornelius [place] 09/02/2014, 2:25 PM  ADDENDUM:  PER MINH PHAM CASE MANAGER SHOULD BE ABLE TO GET HIS PT (WHO DOES NOT HAVE MEDICARE RX PLAN AND NEEDS SPAP ENROLLMENT AS SOON AS POSSIBLE VIA OUR CLINIC, PT IS ELIGIBLE FOR A 30 D SUPPLY OF ANTIRETROVIRAL THERAPY INCLUDING TIVICAY AND TRUVADA VIA THE "MATCH PROGRAM"

## 2014-09-03 MED ORDER — SULFAMETHOXAZOLE-TRIMETHOPRIM 800-160 MG PO TABS: 2.0000 | ORAL_TABLET | Freq: Three times a day (TID) | ORAL | Status: DC

## 2014-09-03 MED ORDER — PREDNISONE 20 MG PO TABS: 40.0000 mg | ORAL_TABLET | Freq: Two times a day (BID) | ORAL | Status: DC

## 2014-09-03 MED ORDER — CYCLOBENZAPRINE HCL 5 MG PO TABS: 5.0000 mg | ORAL_TABLET | Freq: Three times a day (TID) | ORAL | Status: DC | PRN

## 2014-09-03 MED ORDER — PREDNISONE 20 MG PO TABS: 20.0000 mg | ORAL_TABLET | Freq: Every day | ORAL | Status: DC

## 2014-09-03 MED ORDER — DOLUTEGRAVIR SODIUM 50 MG PO TABS: 50.0000 mg | ORAL_TABLET | Freq: Every day | ORAL | Status: AC

## 2014-09-03 MED ORDER — FLUCONAZOLE 200 MG PO TABS: 200.0000 mg | ORAL_TABLET | Freq: Every day | ORAL | Status: DC

## 2014-09-03 MED ORDER — AZITHROMYCIN 600 MG PO TABS: 1200.0000 mg | ORAL_TABLET | ORAL | Status: DC

## 2014-09-03 MED ORDER — EMTRICITABINE-TENOFOVIR DF 200-300 MG PO TABS: 1.0000 | ORAL_TABLET | Freq: Every day | ORAL | Status: AC

## 2014-09-03 NOTE — Progress Notes (Signed)
Talked to patient about follow up medical care, patient plans to follow up at the [facility] after discharge; Patient has Medicare and had Medicaid but it lapsed; patient stated that he completed the paperwork to renew his Medicaid; [facility] ( [facility]) and stated that if he cannot [facility] back to [facility] he will not go to a Shelter. Very poor support system, parents are deceased, his sister lives in [place], [place] but is handicapped and he has a brother in [place]. CM informed the patient that he could call various Church's in the [place] area for help also. MATCH fund only approved for 14 days for Tivicay and Truvada per Case Chief Executive OfficerManagement Assistant Director. Dr Luciana Axeomer updated to have medication assistance program expedited. CM informed [facility] to [facility] his medication, patient acknowledged understanding and of the importance of getting his medication today; Dysheka Soc Worker has arranged a taxi for transportation home. Abelino DerrickB Daelen Belvedere RN,BSN,MHA [phone]

## 2014-09-03 NOTE — Progress Notes (Signed)
ANTIBIOTIC CONSULT NOTE - Follow-up  Pharmacy Consult for Bactrim  Indication: r/o pneumocystis PNA  Allergies  Allergen Reactions  . Amoxicillin Hives  . Ketorolac Hives  . Penicillins Hives  . Tape     Blisters come up wherever tape is.  . Tramadol     Seizures    Patient Measurements: Height: 5\' 9"  (175.3 cm) Weight: 183 lb 12.8 oz (83.371 kg) IBW/kg (Calculated) : 70.7  Vital Signs: Temp: 98.2 F (36.8 C) (04/18 0633) Temp Source: Oral (04/18 40980633) BP: 118/76 mmHg (04/18 11910633) Pulse Rate: 71 (04/18 0633)   Labs:  Recent Labs  09/01/14 1135 09/02/14 0630  WBC 3.4* 6.1  HGB 13.0 14.4  PLT 127* 131*  CREATININE 0.98 0.79   Estimated Creatinine Clearance: 124 mL/min (by C-G formula based on Cr of 0.79).  Assessment: 39 y/o M with hx of HIV, [facility] per MD note. Pt continues on IV Bactrim, po prednisone Day #3 for possible partially treated PCP, Diflucan D#2 for thrush. Afebrile, WBC WNL. RVP positive for rhinovirus, flu neg. HIV (home dolutegravir + truvada +ppx azith weekly) - noted noncompliance with HIV meds. CD4 pending. ID following.  Vanc 4/15>>4/16  Cefepime 4/15>>4/16  Bactrim 4/15>>  Diflucan 4/17>>   4/15 Blood - NGTD  4/15 Cdiff - NEG  Plan:  - Continue bactrim 420 mg (5mg /kg) IV Q8H  - F/u renal fxn, C&S, and clinical status  - F/u CD4 + further ID recs   Christoper Fabianaron Daliana Leverett, PharmD, BCPS Clinical pharmacist, pager [phone] 09/03/2014,10:01 AM

## 2014-09-03 NOTE — Discharge Summary (Signed)
Physician Discharge Summary  Mason Meadows ZOX:[id number] DOB: 1975-10-19 DOA: 08/31/2014  PCP: Corky Downs, MD  Admit date: 08/31/2014 Discharge date: 09/03/2014  Time spent: 20 minutes  Recommendations for Outpatient Follow-up:  1. Follow up with PCP in 1-2 weeks 2. Follow up with ID as scheduled for 4/21 3. Patient reminded to abstain from cocaine  Discharge Diagnoses:  Principal Problem:   Fever Active Problems:   Chest pain   Pancytopenia   HIV (human immunodeficiency virus infection)   PCP (pneumocystis carinii pneumonia)   Noncompliance   Thrush   Discharge Condition: Improved  Diet recommendation: Regular  Filed Weights   08/31/14 0042 08/31/14 0610  Weight: 83.915 kg (185 lb) 83.371 kg (183 lb 12.8 oz)    History of present illness:  Please see admit h and p from 4/15 for details. Briefly, pt presented with fevers and chest pain, admitted for further work up  Hospital Course:  1. Fever with chest pains, possible PCP PNA vs rhinovirus 1. Initially placed on empiric vancomycin, cefepime, and bactrim 2. Respiratory culture pos for rhinovirus 3. Flu neg 4. Remained afebrile throughout the remainder of the course 5. Blood cx neg x 2 thus far 6. UA is normal 7. Appreciate input by ID 8. Pt is continued on empiric azithromycin and bactrim with steroids, ID recs to continue 2 tabs DS bactrim TID and  Prednisone for another 11 days after discharge today 9. No leukocytosis 2. AIDS 1. CD4 of 10 with VL of 29k in 2015 2. Had not been taking anti-virals prior to admit secondary to gross medical noncompliance 3. Pt started back on antivirals per ID on 4/16 4. When asked about plans to f/u with ID, pt states, "I guess I'll just try my best, but can't promise anything." 5. Very strong chance patient will once again be lost to follow up. ID recs for SAP enrollment through ID and 30 day supply of HIV meds through "[facility]" 3. Pancytopenia 1. Likely secondary  to HIV/AIDS 2. Improved 4. Polysubstance abuse 1. Cocaine positive 2. Cessation done at bedside 5. Tobacco abuse 1. Cessation done at bedside 6. DVT prophylaxis 1. lovenox  Consultations:  ID  Discharge Exam: Filed Vitals:   09/03/14 0128 09/03/14 0633 09/03/14 1024 09/03/14 1429  BP: 123/84 118/76 125/76 131/79  Pulse: 60 71 73 91  Temp: 98.1 F (36.7 C) 98.2 F (36.8 C) 98 F (36.7 C) 98 F (36.7 C)  TempSrc: Oral Oral Oral Oral  Resp: Height:      Weight:      SpO2: 99% 97% 96% 98%    General: Awake, in nad Cardiovascular: regular, s1, s2 Respiratory: normal resp effort, no wheezing  Discharge Instructions     Medication List    STOP taking these medications        dextrose 5 % SOLN 500 mL with sulfamethoxazole-trimethoprim 400-80 MG/5ML SOLN 320 mg     enoxaparin 40 MG/0.4ML injection  Commonly known as:  LOVENOX     feeding supplement (RESOURCE BREEZE) Liqd     guaiFENesin 600 MG 12 hr tablet  Commonly known as:  MUCINEX     ipratropium-albuterol 0.5-2.5 (3) MG/3ML Soln  Commonly known as:  DUONEB     levofloxacin 750 MG/150ML Soln  Commonly known as:  LEVAQUIN     LORazepam 1 MG tablet  Commonly known as:  ATIVAN     methylPREDNISolone sodium succinate 125 mg/2 mL injection  Commonly known as:  SOLU-MEDROL  ondansetron 4 MG tablet  Commonly known as:  ZOFRAN     oxyCODONE 5 MG immediate release tablet  Commonly known as:  Oxy IR/ROXICODONE     sodium chloride 0.45 % solution     zolpidem 5 MG tablet  Commonly known as:  AMBIEN      TAKE these medications        acetaminophen 325 MG tablet  Commonly known as:  TYLENOL  Take 2 tablets (650 mg total) by mouth every 6 (six) hours as needed for mild pain (or Fever >/= 101).     albuterol (2.5 MG/3ML) 0.083% nebulizer solution  Commonly known as:  PROVENTIL  Take 3 mLs (2.5 mg total) by nebulization every 2 (two) hours as needed for wheezing.     azithromycin 600  MG tablet  Commonly known as:  ZITHROMAX  Take 2 tablets (1,200 mg total) by mouth once a week.     cyclobenzaprine 5 MG tablet  Commonly known as:  FLEXERIL  Take 1 tablet (5 mg total) by mouth 3 (three) times daily as needed for muscle spasms (muscle ache).     dolutegravir 50 MG tablet  Commonly known as:  TIVICAY  Take 1 tablet (50 mg total) by mouth daily.     emtricitabine-tenofovir 200-300 MG per tablet  Commonly known as:  TRUVADA  Take 1 tablet by mouth daily.     fluconazole 200 MG tablet  Commonly known as:  DIFLUCAN  Take 1 tablet (200 mg total) by mouth daily.     predniSONE 20 MG tablet  Commonly known as:  DELTASONE  Take 1 tablet (20 mg total) by mouth daily.     sulfamethoxazole-trimethoprim 800-160 MG per tablet  Commonly known as:  BACTRIM DS,SEPTRA DS  Take 2 tablets by mouth 3 (three) times daily.       Allergies  Allergen Reactions  . Amoxicillin Hives  . Ketorolac Hives  . Penicillins Hives  . Tape     Blisters come up wherever tape is.  . Tramadol     Seizures   Follow-up Information    Follow up with MASOUD,JAVED, MD. Schedule an appointment as soon as possible for a visit in 1 week.   Specialty:  Internal Medicine   Why:  Hospital follow up   Contact information:   [address] [place] [address] [phone]       Follow up with Staci RighterOMER, ROBERT, MD.   Specialty:  Infectious Diseases   Why:  as scheduled on 4/21, Hospital follow up   Contact information:   [address] [place] [address] [phone]        The results of significant diagnostics from this hospitalization (including imaging, microbiology, ancillary and laboratory) are listed below for reference.    Significant Diagnostic Studies: Dg Chest 2 View (if Patient Has Fever And/or Copd)  08/31/2014   CLINICAL DATA:  Right flank and rib pain, symptoms for 1 week.  EXAM: CHEST  2 VIEW  COMPARISON:  06/14/2013  FINDINGS: Lung volumes are low with mild  bibasilar atelectasis. The cardiomediastinal contours are normal. Pulmonary vasculature is normal. No consolidation, pleural effusion, or pneumothorax. No acute osseous abnormalities are seen.  IMPRESSION: Hypoventilatory chest with bibasilar atelectasis.   Electronically Signed   By: Rubye OaksMelanie  Ehinger M.D.   On: 08/31/2014 02:44   Ct Angio Chest Pe W/cm &/or Wo Cm  08/31/2014   CLINICAL DATA:  Right-sided chest pain with inspiration  EXAM: CT ANGIOGRAPHY CHEST  WITH CONTRAST  TECHNIQUE: Multidetector CT imaging of the chest was performed using the standard protocol during bolus administration of intravenous contrast. Multiplanar CT image reconstructions and MIPs were obtained to evaluate the vascular anatomy.  CONTRAST:  75mL OMNIPAQUE IOHEXOL 350 MG/ML SOLN  COMPARISON:  None.  FINDINGS: THORACIC INLET/BODY WALL:  No acute abnormality.  MEDIASTINUM:  Normal heart size. No pericardial effusion. No acute vascular abnormality. Borderline diffuse esophageal thickening. CTA of the pulmonary arteries is limited by motion, especially at the bases, but exam is overall diagnostic and negative for pulmonary embolism. No aortic aneurysm or dissection.  LUNG WINDOWS:  Central bronchial wall thickening with intermittent mucoid impaction. There is no focal pneumonia. No edema, effusion, or pneumothorax. No suspicious ground-glass density or cystic change in this patient with history presumed pneumocystis pneumonia. There are few scattered pulmonary nodules, with the larger in the left upper lobe on image 26 measuring 6 mm.  UPPER ABDOMEN:  No acute findings.  OSSEOUS:  Remote T3, T4, T5, T6, and T7 superior endplate/compression fractures with exaggerated thoracic kyphosis. Height loss is worst at T4, approximately 75%. There is bilateral foraminal narrowing at T4-5 from disc loss and facet fusion with bony overgrowth.  Review of the MIP images confirms the above findings.  IMPRESSION: 1. No evidence of pulmonary embolism. 2.  Bronchitis. 3. 6 mm left upper lobe pulmonary nodule. Given smoking history, follow-up chest CT at 6 - 12 months is recommended. This recommendation follows the consensus statement: Guidelines for Management of Small Pulmonary Nodules Detected on CT Scans: A Statement from the Fleischner Society as published in Radiology 2005;237:395-400. 4. Remote compression/superior endplate fractures of T3 through T7 with exaggerated kyphosis.   Electronically Signed   By: Marnee Spring M.D.   On: 08/31/2014 06:25    Microbiology: Recent Results (from the past 240 hour(s))  Blood culture (routine x 2)     Status: None (Preliminary result)   Collection Time: 08/31/14  4:45 AM  Result Value Ref Range Status   Specimen Description BLOOD LEFT ARM  Final   Special Requests BOTTLES DRAWN AEROBIC ONLY 5CCS  Final   Culture   Final           BLOOD CULTURE RECEIVED NO GROWTH TO DATE CULTURE WILL BE HELD FOR 5 DAYS BEFORE ISSUING A FINAL NEGATIVE REPORT Performed at [facility]    Report Status PENDING  Incomplete  Blood culture (routine x 2)     Status: None (Preliminary result)   Collection Time: 08/31/14  4:48 AM  Result Value Ref Range Status   Specimen Description BLOOD LEFT HAND  Final   Special Requests BOTTLES DRAWN AEROBIC ONLY 5CCS  Final   Culture   Final           BLOOD CULTURE RECEIVED NO GROWTH TO DATE CULTURE WILL BE HELD FOR 5 DAYS BEFORE ISSUING A FINAL NEGATIVE REPORT Performed at [facility]    Report Status PENDING  Incomplete  Respiratory virus panel     Status: Abnormal   Collection Time: 08/31/14  5:12 AM  Result Value Ref Range Status   Respiratory Syncytial Virus A Negative Negative Final   Respiratory Syncytial Virus B Negative Negative Final   Influenza A Negative Negative Final   Influenza B Negative Negative Final   Parainfluenza 1 Negative Negative Final   Parainfluenza 2 Negative Negative Final   Parainfluenza 3 Negative Negative Final   Metapneumovirus  Negative Negative Final   Rhinovirus Positive (A) Negative  Final   Adenovirus Negative Negative Final    Comment: (NOTE) Performed At: [facility] [address] [place], [place] [id number] Mila Homer MD [id number]   Clostridium Difficile by PCR     Status: None   Collection Time: 08/31/14 11:07 PM  Result Value Ref Range Status   C difficile by pcr NEGATIVE NEGATIVE Final  Stool culture     Status: None (Preliminary result)   Collection Time: 08/31/14 11:07 PM  Result Value Ref Range Status   Specimen Description STOOL  Final   Special Requests NONE  Final   Culture   Final    MODERATE YEAST Note: REDUCED NORMAL FLORA PRESENT Performed at [facility]    Report Status PENDING  Incomplete     Labs: Basic Metabolic Panel:  Recent Labs Lab 08/31/14 0114 09/01/14 1135 09/02/14 0630  NA 140 139 136  K 3.9 3.5 4.5  CL 103 109 107  CO2 26 21 18*  GLUCOSE 89 85 95  BUN <5* 8 6  CREATININE 0.97 0.98 0.79  CALCIUM 8.6 8.1* 8.4   Liver Function Tests:  Recent Labs Lab 08/31/14 0114  AST 25  ALT 26  ALKPHOS 61  BILITOT 0.6  PROT 6.1  ALBUMIN 3.3*   No results for input(s): LIPASE, AMYLASE in the last 168 hours. No results for input(s): AMMONIA in the last 168 hours. CBC:  Recent Labs Lab 08/31/14 0102 08/31/14 0256 09/01/14 1135 09/02/14 0630  WBC 2.3* 2.2* 3.4* 6.1  NEUTROABS  --  1.1*  --   --   HGB 13.6 12.8* 13.0 14.4  HCT 39.5 37.5* 38.6* 40.6  MCV 88.4 88.0 88.3 87.1  PLT 147* 141* 127* 131*   Cardiac Enzymes: No results for input(s): CKTOTAL, CKMB, CKMBINDEX, TROPONINI in the last 168 hours. BNP: BNP (last 3 results) No results for input(s): BNP in the last 8760 hours.  ProBNP (last 3 results) No results for input(s): PROBNP in the last 8760 hours.  CBG: No results for input(s): GLUCAP in the last 168 hours.   Signed:  Claribel Sachs K  [facility] 09/03/2014, 3:12 PM

## 2014-09-03 NOTE — Progress Notes (Signed)
[facility]  Date of Admission:  08/31/2014  Antibiotics: Tivicay and Truvada Bactrim and Steroids  Subjective: No complaints  Objective: Temp:  [97.7 F (36.5 C)-98.7 F (37.1 C)] 98.2 F (36.8 C) (04/18 0633) Pulse Rate:  [60-103] 73 (04/18 1024) Resp:  [18-20] 18 (04/18 1024) BP: (118-137)/(76-87) 125/76 mmHg (04/18 1024) SpO2:  [96 %-99 %] 96 % (04/18 1024)  General: awake, nad Skin: no rashes Lungs: CTA B Cor: RRR without m Abdomen: soft, nt, nd Ext: no edema  Lab Results Lab Results  Component Value Date   WBC 6.1 09/02/2014   HGB 14.4 09/02/2014   HCT 40.6 09/02/2014   MCV 87.1 09/02/2014   PLT 131* 09/02/2014    Lab Results  Component Value Date   CREATININE 0.79 09/02/2014   BUN 6 09/02/2014   NA 136 09/02/2014   K 4.5 09/02/2014   CL 107 09/02/2014   CO2 18* 09/02/2014    Lab Results  Component Value Date   ALT 26 08/31/2014   AST 25 08/31/2014   ALKPHOS 61 08/31/2014   BILITOT 0.6 08/31/2014      Microbiology: Recent Results (from the past 240 hour(s))  Blood culture (routine x 2)     Status: None (Preliminary result)   Collection Time: 08/31/14  4:45 AM  Result Value Ref Range Status   Specimen Description BLOOD LEFT ARM  Final   Special Requests BOTTLES DRAWN AEROBIC ONLY 5CCS  Final   Culture   Final           BLOOD CULTURE RECEIVED NO GROWTH TO DATE CULTURE WILL BE HELD FOR 5 DAYS BEFORE ISSUING A FINAL NEGATIVE REPORT Performed at [facility]    Report Status PENDING  Incomplete  Blood culture (routine x 2)     Status: None (Preliminary result)   Collection Time: 08/31/14  4:48 AM  Result Value Ref Range Status   Specimen Description BLOOD LEFT HAND  Final   Special Requests BOTTLES DRAWN AEROBIC ONLY 5CCS  Final   Culture   Final           BLOOD CULTURE RECEIVED NO GROWTH TO DATE CULTURE WILL BE HELD FOR 5 DAYS BEFORE ISSUING A FINAL NEGATIVE REPORT Performed at [facility]    Report Status PENDING  Incomplete  Respiratory virus panel     Status: Abnormal   Collection Time: 08/31/14  5:12 AM  Result Value Ref Range Status   Respiratory Syncytial Virus A Negative Negative Final   Respiratory Syncytial Virus B Negative Negative Final   Influenza A Negative Negative Final   Influenza B Negative Negative Final   Parainfluenza 1 Negative Negative Final   Parainfluenza 2 Negative Negative Final   Parainfluenza 3 Negative Negative Final   Metapneumovirus Negative Negative Final   Rhinovirus Positive (A) Negative Final   Adenovirus Negative Negative Final    Comment: (NOTE) Performed At: [facility] [address] [place], [place] [id number] Mila Homer MD WU:[id number]   Clostridium Difficile by PCR     Status: None   Collection Time: 08/31/14 11:07 PM  Result Value Ref Range Status   C difficile by pcr NEGATIVE NEGATIVE Final  Stool culture     Status: None (Preliminary result)   Collection Time: 08/31/14 11:07 PM  Result Value Ref Range Status   Specimen Description STOOL  Final   Special Requests NONE  Final   Culture   Final    MODERATE YEAST  Note: REDUCED NORMAL FLORA PRESENT [facility]formed at [facility]    Report Status PENDING  Incomplete    Studies/Results: No results found.  Assessment/Plan:  1) HIV/AIDS - recent CD4 10, just started Tivicay and Truvada.  Has been seen by our [facility] counselor and set up to see me in clinic on Thursday.  Discussed need for compliance.  I discussed with CM here on 4N and he can get 30 days supply now prior to d/c.  [place] baseline [place] from [place] He does NOT have medicare D, though is on disablility and previously was on medicaid but did not reapply.    2) possible PCP pneumonia - can continue with Bactrim and steroids with 2 DS Bactrim three times per day and 20 mg prednisone daily, both for another 11 days.   Azithromycin 1200 mg po weekly  3) depression - to go to [facility].     Staci RighterOMER, Oluwafemi Villella, MD [facility] [email or web address] [phone] pager   [phone] cell 09/03/2014, 2:29 PM

## 2014-09-03 NOTE — Progress Notes (Signed)
Pt discharging at this time alert, verbal with no noted distress. IV discontinued, applied dry dressing. Discharge instructions and education provided along with prescriptions with verbal understanding. Pt made aware of follow up appts. He denied pain or discomfort. Cab called for pickup taking all personal belongings.

## 2014-09-04 LAB — HIV-1 RNA QUANT-NO REFLEX-BLD
HIV 1 RNA QUANT: 19920 {copies}/mL
LOG10 HIV-1 RNA: 4.299 log10copy/mL

## 2014-09-05 LAB — STOOL CULTURE

## 2014-09-06 ENCOUNTER — Ambulatory Visit: Payer: Medicare Other | Admitting: Internal Medicine

## 2014-09-06 LAB — CULTURE, BLOOD (ROUTINE X 2)
Culture: NO GROWTH
Culture: NO GROWTH

## 2014-09-07 ENCOUNTER — Encounter (HOSPITAL_COMMUNITY): Payer: Self-pay | Admitting: Emergency Medicine

## 2014-09-07 ENCOUNTER — Emergency Department (HOSPITAL_COMMUNITY)
Admission: EM | Admit: 2014-09-07 | Discharge: 2014-09-08 | Disposition: A | Discharge status: Other Acute Inpatient Hospital | Payer: Medicare Other | Attending: Emergency Medicine | Admitting: Emergency Medicine

## 2014-09-07 DIAGNOSIS — Z8659 Personal history of other mental and behavioral disorders: Secondary | ICD-10-CM | POA: Insufficient documentation

## 2014-09-07 DIAGNOSIS — Z8701 Personal history of pneumonia (recurrent): Secondary | ICD-10-CM | POA: Diagnosis not present

## 2014-09-07 DIAGNOSIS — Z880 Allergy status to penicillin: Secondary | ICD-10-CM | POA: Diagnosis not present

## 2014-09-07 DIAGNOSIS — Z87891 Personal history of nicotine dependence: Secondary | ICD-10-CM | POA: Insufficient documentation

## 2014-09-07 DIAGNOSIS — R45851 Suicidal ideations: Secondary | ICD-10-CM | POA: Diagnosis not present

## 2014-09-07 DIAGNOSIS — Z79899 Other long term (current) drug therapy: Secondary | ICD-10-CM | POA: Insufficient documentation

## 2014-09-07 DIAGNOSIS — B20 Human immunodeficiency virus [HIV] disease: Secondary | ICD-10-CM | POA: Diagnosis not present

## 2014-09-07 LAB — CBC WITH DIFFERENTIAL/PLATELET
Basophils Absolute: 0 10*3/uL (ref 0.0–0.1)
Basophils Relative: 0 % (ref 0–1)
Eosinophils Absolute: 0.1 10*3/uL (ref 0.0–0.7)
Eosinophils Relative: 1 % (ref 0–5)
HCT: 42.1 % (ref 39.0–52.0)
Hemoglobin: 14.1 g/dL (ref 13.0–17.0)
LYMPHS ABS: 2.1 10*3/uL (ref 0.7–4.0)
Lymphocytes Relative: 35 % (ref 12–46)
MCH: 30.3 pg (ref 26.0–34.0)
MCHC: 33.5 g/dL (ref 30.0–36.0)
MCV: 90.3 fL (ref 78.0–100.0)
Monocytes Absolute: 0.7 10*3/uL (ref 0.1–1.0)
Monocytes Relative: 11 % (ref 3–12)
NEUTROS ABS: 3.1 10*3/uL (ref 1.7–7.7)
NEUTROS PCT: 53 % (ref 43–77)
PLATELETS: 257 10*3/uL (ref 150–400)
RBC: 4.66 MIL/uL (ref 4.22–5.81)
RDW: 17.7 % — ABNORMAL HIGH (ref 11.5–15.5)
WBC: 6 10*3/uL (ref 4.0–10.5)

## 2014-09-07 LAB — RAPID URINE DRUG SCREEN, HOSP PERFORMED
Amphetamines: NOT DETECTED
BARBITURATES: NOT DETECTED
BENZODIAZEPINES: NOT DETECTED
COCAINE: POSITIVE — AB
Opiates: NOT DETECTED
Tetrahydrocannabinol: NOT DETECTED

## 2014-09-07 LAB — COMPREHENSIVE METABOLIC PANEL
ALBUMIN: 3.8 g/dL (ref 3.5–5.2)
ALK PHOS: 65 U/L (ref 39–117)
ALT: 27 U/L (ref 0–53)
AST: 24 U/L (ref 0–37)
Anion gap: 14 (ref 5–15)
BUN: 16 mg/dL (ref 6–23)
CHLORIDE: 100 mmol/L (ref 96–112)
CO2: 23 mmol/L (ref 19–32)
Calcium: 8.6 mg/dL (ref 8.4–10.5)
Creatinine, Ser: 1 mg/dL (ref 0.50–1.35)
GFR calc Af Amer: >90 mL/min (ref >90)
GFR calc non Af Amer: >90 mL/min (ref >90)
Glucose, Bld: 75 mg/dL (ref 70–99)
Potassium: 4.2 mmol/L (ref 3.5–5.1)
SODIUM: 137 mmol/L (ref 135–145)
TOTAL PROTEIN: 7 g/dL (ref 6.0–8.3)
Total Bilirubin: 1.4 mg/dL — ABNORMAL HIGH (ref 0.3–1.2)

## 2014-09-07 LAB — ETHANOL

## 2014-09-07 MED ORDER — FLUCONAZOLE 100 MG PO TABS
200.0000 mg | ORAL_TABLET | Freq: Every day | ORAL | Status: DC
  Administered 2014-09-07: 200 mg via ORAL
  Filled 2014-09-07: qty 2

## 2014-09-07 MED ORDER — DOLUTEGRAVIR SODIUM 50 MG PO TABS
50.0000 mg | ORAL_TABLET | Freq: Every day | ORAL | Status: DC
  Administered 2014-09-07: 50 mg via ORAL
  Filled 2014-09-07 (×3): qty 1

## 2014-09-07 MED ORDER — SULFAMETHOXAZOLE-TRIMETHOPRIM 800-160 MG PO TABS
2.0000 | ORAL_TABLET | Freq: Three times a day (TID) | ORAL | Status: DC
  Administered 2014-09-07 (×3): 2 via ORAL
  Filled 2014-09-07: qty 1
  Filled 2014-09-07 (×2): qty 2

## 2014-09-07 MED ORDER — CYCLOBENZAPRINE HCL 10 MG PO TABS
5.0000 mg | ORAL_TABLET | Freq: Three times a day (TID) | ORAL | Status: DC | PRN
  Administered 2014-09-07: 5 mg via ORAL
  Filled 2014-09-07: qty 1

## 2014-09-07 MED ORDER — EMTRICITABINE-TENOFOVIR DF 200-300 MG PO TABS
1.0000 | ORAL_TABLET | Freq: Every day | ORAL | Status: DC
  Administered 2014-09-07: 1 via ORAL
  Filled 2014-09-07 (×2): qty 1

## 2014-09-07 MED ORDER — AZITHROMYCIN 600 MG PO TABS
1200.0000 mg | ORAL_TABLET | ORAL | Status: DC
  Administered 2014-09-07: 1200 mg via ORAL
  Filled 2014-09-07 (×2): qty 2

## 2014-09-07 MED ORDER — PREDNISONE 20 MG PO TABS
20.0000 mg | ORAL_TABLET | Freq: Every day | ORAL | Status: DC
  Administered 2014-09-07: 20 mg via ORAL
  Filled 2014-09-07: qty 1
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
## 2014-09-07 NOTE — ED Notes (Signed)
Pt belongings inventoried and placed in belongings closet. Inventory sheet in Pod C binder.

## 2014-09-07 NOTE — ED Notes (Signed)
Pt is still expressing thoughts of wanting to harm himself; breakfast eaten; Denies any needs or discomforts at this time; sitter remains at bedside.

## 2014-09-07 NOTE — ED Notes (Signed)
Patient has arrived on pod c accompanied by sitter

## 2014-09-07 NOTE — Progress Notes (Signed)
This Clinical research associatewriter received call from [place] [phone]) at [facility].  Kelvin requesting Dr's and the counselor's note, and the labs to be faxed at [phone]. Kelvin was informed that pt's referral was faxed at this time.  Mel[place] Abtsatia Cleveland Paiz, LCSWA Disposition staff 09/07/2014 7:26 PM

## 2014-09-07 NOTE — Progress Notes (Addendum)
Patient accepted at [facility], per [employer]. Kelvin to call back with bed assignment and Dr.  Per RN Drinda ButtsAnnette, patient has been waiting for the [facility].  Kelvin at [facility] stated that he is now faxing the voluntary [facility] to [employer] [facility].  CSW to continue to follow up upon patient's [facility].  Mason Meadows, LCSWA Disposition staff 09/07/2014 4:06 PM

## 2014-09-07 NOTE — ED Notes (Signed)
Telepsyc completed.

## 2014-09-07 NOTE — ED Notes (Signed)
Called [facility], unable to transport pt until 1130

## 2014-09-07 NOTE — ED Provider Notes (Signed)
CSN: [id number]     Arrival date & time 09/07/14  0206 History   First MD Initiated Contact with Patient 09/07/14 0238     This chart was scribed for Mason Mo, MD by Mason Meadows, ED Scribe. This patient was seen in room A10C/A10C and the patient's care was started 3:41 AM.   Chief Complaint  Patient presents with  . Suicidal   The history is provided by the patient. No language interpreter was used.    HPI Comments: Mason Meadows is a 39 y.o. male with a PMHx of AIDS who presents to the Emergency Department here for suicidal ideation this evening.  No active plan, but states that if he was walking over a bridge he might jump. Pt admits he is not compliant with his medications and states he has been using recreation drugs. He states he did not have transportation to get to pharmacy to pick up his prescriptions from his last visit in department. [name] states he has been very overwhelmed in the last few days. He denies any HI at this time. No auditory or visual hallucinations. No reported pain or medical complaint at this time. Pt with known allergies to Amoxicillin, ketorolac, Penicillins, and Tramadol.    Past Medical History  Diagnosis Date  . Pneumonia   . Anxiety   . AIDS (acquired immune deficiency syndrome)    Past Surgical History  Procedure Laterality Date  . Back surgery    . Total hip arthroplasty     Family History  Problem Relation Age of Onset  . Hypertension Mother   . Diabetes Mellitus II Mother   . Hypertension Father   . Diabetes Mellitus II Father    History  Substance Use Topics  . Smoking status: Former Smoker -- 1.00 packs/day for 10 years    Types: Cigarettes    Quit date: 05/14/2013  . Smokeless tobacco: Never Used  . Alcohol Use: 0.6 oz/week    1 Standard drinks or equivalent per week    Review of Systems  Constitutional: Negative for fever and chills.  Gastrointestinal: Negative for nausea, vomiting and diarrhea.  Skin: Negative for  rash.  Psychiatric/Behavioral: Positive for suicidal ideas and sleep disturbance. Negative for hallucinations and self-injury. The patient is not nervous/anxious.   All other systems reviewed and are negative.     Allergies  Amoxicillin; Ketorolac; Penicillins; Tape; and Tramadol  Home Medications   Prior to Admission medications   Medication Sig Start Date End Date Taking? Authorizing Provider  azithromycin (ZITHROMAX) 600 MG tablet Take 2 tablets (1,200 mg total) by mouth once a week. 09/03/14  Yes Mason Kief, MD  cyclobenzaprine (FLEXERIL) 5 MG tablet Take 1 tablet (5 mg total) by mouth 3 (three) times daily as needed for muscle spasms (muscle ache). 09/03/14  Yes Mason Kief, MD  dolutegravir (TIVICAY) 50 MG tablet Take 1 tablet (50 mg total) by mouth daily. 09/03/14  Yes Mason Kief, MD  emtricitabine-tenofovir (TRUVADA) 200-300 MG per tablet Take 1 tablet by mouth daily. 09/03/14  Yes Mason Kief, MD  fluconazole (DIFLUCAN) 200 MG tablet Take 1 tablet (200 mg total) by mouth daily. 09/03/14  Yes Mason Kief, MD  predniSONE (DELTASONE) 20 MG tablet Take 1 tablet (20 mg total) by mouth daily. 09/03/14  Yes Mason Kief, MD  sulfamethoxazole-trimethoprim (BACTRIM DS,SEPTRA DS) 800-160 MG per tablet Take 2 tablets by mouth 3 (three) times daily. 09/03/14  Yes Mason Kief, MD  acetaminophen (TYLENOL)  325 MG tablet Take 2 tablets (650 mg total) by mouth every 6 (six) hours as needed for mild pain (or Fever >/= 101). Patient not taking: Reported on 08/31/2014 06/02/13   Mason PoliceMarianne L York, PA-C  albuterol (PROVENTIL) (2.5 MG/3ML) 0.083% nebulizer solution Take 3 mLs (2.5 mg total) by nebulization every 2 (two) hours as needed for wheezing. Patient not taking: Reported on 08/31/2014 06/02/13   Mason PoliceMarianne L York, PA-C   Triage Vitals: BP 118/72 mmHg  Pulse 95  Temp(Src) 98.1 F (36.7 C) (Oral)  Resp 16  SpO2 97%  Physical Exam  Constitutional: He is oriented to person, place, and  time. He appears well-developed and well-nourished.  HENT:  Head: Normocephalic and atraumatic.  Eyes: Conjunctivae and EOM are normal.  Neck: Normal range of motion. Neck supple.  Cardiovascular: Normal rate, regular rhythm and normal heart sounds.   Pulmonary/Chest: Effort normal and breath sounds normal. No respiratory distress.  Abdominal: He exhibits no distension. There is no tenderness. There is no rebound and no guarding.  Musculoskeletal: Normal range of motion.  Neurological: He is alert and oriented to person, place, and time.  Skin: Skin is warm and dry.  Vitals reviewed.   ED Course  Procedures (including critical care time)  DIAGNOSTIC STUDIES: Oxygen Saturation is 98% on RA, Normal by my interpretation.    COORDINATION OF CARE: 3:50 AM-Discussed treatment plan with pt at bedside and pt agreed to plan.     Labs Review Labs Reviewed  CBC WITH DIFFERENTIAL/PLATELET - Abnormal; Notable for the following:    RDW 17.7 (*)    All other components within normal limits  COMPREHENSIVE METABOLIC PANEL - Abnormal; Notable for the following:    Total Bilirubin 1.4 (*)    All other components within normal limits  ETHANOL  URINE RAPID DRUG SCREEN (HOSP PERFORMED)    Imaging Review No results found.   EKG Interpretation None      MDM   Final diagnoses:  None    39 y.o. male with pertinent PMH of HIV with poor compliance, recent admission for neutropenic fever and sent home with azithro, bactrim, prednisone (noncompliant with all medication) presents with SI as above.  Exam benign, medically clear.  Consulted TTS.    I have reviewed all laboratory and imaging studies if ordered as above  1. SI       Mason MoMatthew Gentry, MD 09/07/14 [phone]

## 2014-09-07 NOTE — Progress Notes (Addendum)
Per Claris CheMargaret at [facility], patient [facility] by Dr. Ardyth HarpsHernandez, to bed 319. Call report at [phone]. Patient can come after 9pm. [facility]-ED nurse aware.  Melbourne Abtsatia Maud Rubendall, LCSWA Disposition staff 09/07/2014 9:14 PM

## 2014-09-07 NOTE — ED Provider Notes (Signed)
  Physical Exam  BP 122/76 mmHg  Pulse 91  Temp(Src) 98.1 F (36.7 C) (Oral)  Resp 20  SpO2 94%  Physical Exam  ED Course  Procedures  MDM Per TTS meets inpatient criteria, but no [facility] beds.      Benjiman CoreNathan Youlanda Tomassetti, MD 09/07/14 [phone]

## 2014-09-07 NOTE — ED Notes (Signed)
PATIENT HAS SIGNED HIS VOLUNTARY ADMIT PAPER

## 2014-09-07 NOTE — ED Notes (Signed)
Pt. reports suicidal ideation plans to over dose on drugs , denies hallucinations .

## 2014-09-07 NOTE — ED Notes (Signed)
FAXED ADMIT PAPER TO [facility] ATTN CALVIN

## 2014-09-07 NOTE — ED Notes (Signed)
Security called to wand pt and staffing called for sitter for pt.

## 2014-09-07 NOTE — ED Notes (Signed)
Lunch ordered for patient.

## 2014-09-08 ENCOUNTER — Inpatient Hospital Stay
Admit: 2014-09-08 | Disposition: A | Discharge status: Home / Self Care | Payer: Self-pay | Attending: Psychiatry | Admitting: Psychiatry

## 2014-09-08 NOTE — H&P (Signed)
PATIENT NAME:  Mason Meadows, Mason Meadows MR#:  [id number] DATE OF BIRTH:  Apr 09, 1976  DATE OF ADMISSION:  09/18/2013  ATTENDING PHYSICIAN: Sixto Bowdish B. Jennet MaduroPucilowska, M.D.   IDENTIFYING DATA: [name] is a 39 year old male with history of depression and substance abuse.   CHIEF COMPLAINT: "I need help."  HISTORY OF PRESENT ILLNESS: [name] has a long history of mood instability. He reports up and down, but it was always in the context of substance use. He got sick physically and was [facility]. Around that time, he learned that when he was in the hospital his mother passed away. He found himself lonely, homeless without any support. He still finds it difficult to believe that his mother passed away as he was unable to say his goodbyes properly. He has been depressed since. He reports poor sleep, decreased appetite, anhedonia, feeling of guilt, hopelessness, worthlessness, crying spells, poor memory and concentration, extremely poor energy. He has not been able to get out of bed most days recently. In addition, he now has suicidal ideation of overdosing on drugs. He wishes he was here no more. He reports another major loss. He has been separated from his partner for the past 2 years and has not been able to recover from the heartbreak. He is positive for cocaine on admission. He says that it is only because he now rents an apartment in the neighborhood where everybody does cocaine and he felt that maybe it will improve his mood when he tries. He denies having a problem with cocaine. He denies alcohol use, illicit drugs or prescription pills. He denies psychotic symptoms or symptoms suggestive of bipolar mania, but does admit periods of elevated mood with hyperactivity, poor impulse control and out-of-character behaviors. He is HIV positive but has not been taking medication at all.  He was placed back on medicines in December when [facility], but did not follow up with  [facility], and is really not interested in taking any medications. He feels that side effects and "organ failure" from HIV medications will kill him sooner than the illness itself.   PAST PSYCHIATRIC HISTORY: Denies ever seeing a psychiatrist. No hospitalizations. No suicide attempts.   FAMILY PSYCHIATRIC HISTORY: Mother with bipolar.   PAST MEDICAL HISTORY: HIV-positive.   MEDICATIONS ON ADMISSION: None.   ALLERGIES: AMOXICILLIN, PENICILLIN, TRAMADOL, ULTRAM.  SOCIAL HISTORY: He is on disability, rents a small apartment by himself.  This is not a safe neighborhood. He is currently not in a relationship. He has no support at all.   REVIEW OF SYSTEMS: CONSTITUTIONAL: No fevers or chills. No weight loss.  EYES: No double or blurred vision.  EARS, NOSE, THROAT: No hearing loss.  RESPIRATORY: No shortness of breath or cough.  CARDIOVASCULAR: No chest pain or orthopnea.  GASTROINTESTINAL: No abdominal pain, nausea, vomiting or diarrhea.  GENITOURINARY: No incontinence or frequency.  ENDOCRINE: No heat or cold intolerance.  LYMPHATIC: No anemia or easy bruising.  INTEGUMENTARY: No acne or rash.  MUSCULOSKELETAL: No muscle or joint pain.  NEUROLOGIC: No tingling or weakness.  PSYCHIATRIC: See history of present illness for details.   PHYSICAL EXAMINATION: VITAL SIGNS: Blood pressure 125/70, pulse 64, respirations 18, temperature 98.2.  GENERAL: This is a slender, unkempt male in no acute distress.  HEENT: The pupils are equal, round and reactive to light. Sclerae are anicteric.  NECK: Supple. No thyromegaly.  LUNGS: Clear to auscultation. No dullness to percussion.  HEART: Regular rhythm and rate. No  murmurs, rubs or gallops.  ABDOMEN: Soft, nontender, nondistended. Positive bowel sounds.  MUSCULOSKELETAL: Normal muscle strength in all extremities.  SKIN: Positive for rash on the face.  LYMPHATIC: No cervical adenopathy.  NEUROLOGIC: Cranial nerves II through XII are intact.    LABORATORY DATA: Chemistries are within normal limits. Blood alcohol level is zero. LFTs within normal limits. Urine tox screen positive for cocaine. CBC within normal limits. Urinalysis is not suggestive of urinary tract infection. Serum acetaminophen and salicylates are low.   EKG: Sinus rhythm with sinus arrhythmia and occasional PVCs.  MENTAL STATUS EXAMINATION ON ADMISSION: The patient is alert and oriented to person, place, time and situation. He is pleasant, polite and cooperative. Rather tearful talking about his troubles. He maintains good eye contact. His speech is soft. Mood is depressed with flat affect. Thought process is logical and goal oriented. Thought content: He denies suicidal or homicidal ideation at the moment but threatened to kill himself if released from the hospital. There are no delusions or paranoia. There are no auditory or visual hallucinations. His cognition is grossly intact. His registration, recall, short and long-term memory are all intact. He is a good historian. He is of normal intelligence and fund of knowledge. His insight and judgment are poor.  SUICIDE RISK ASSESSMENT ON ADMISSION: This is a patient with history of depression, mood instability who suffered major losses recently, including death of his mother and who has not sought any help. He is at increased risk of suicide.   DIAGNOSES: AXIS I:   Major depressive disorder, severe. AXIS II:  Deferred.  AXIS III: Human immunodeficiency virus positive.  AXIS IV: Mental and physical illness, primary support, poor insight into his problems.  AXIS V: Global assessment of functioning 25.   PLAN: The patient was admitted to [facility] Medicine unit for safety, stabilization and medication management. He was initially placed on suicide precautions and was closely monitored for any unsafe behaviors. He underwent full psychiatric and risk assessment. He received pharmacotherapy,  individual and group psychotherapy, substance abuse counseling and support from therapeutic milieu. 1.  Suicidal ideation. He is able to contract for safety.  2.  Mood.  We will probably start a mood stabilizer on an antipsychotic initially before trying an antidepressant for the risk of precipitating a manic episode.  3.  Substance abuse. He denies drinking, most likely no need for alcohol detox. 4.  Human immunodeficiency virus.  We will ask infectious disease consult. 5.  Disposition to home.     ____________________________ Ellin Goodie. Jennet Maduro, MD jbp:ce D: 09/18/2013 16:20:27 ET T: 09/18/2013 16:58:46 ET JOB#: [id number]  cc: Zebulun Deman B. Jennet Maduro, MD, <Dictator> Shari Prows MD ELECTRONICALLY SIGNED 10/12/2013 7:17

## 2014-09-08 NOTE — ED Provider Notes (Signed)
Patient here for suicidal ideation. Chart and labs reviewed. Dr. Littie Deedsgentry initially saw and evaluated the patient. He has remained medically stable here in the emergency department [facility] by Dr. Viann FishHernandes.  Margarita Grizzleanielle Walker Paddack, MD 09/08/14 0040

## 2014-09-08 NOTE — Consult Note (Signed)
Brief Consult Note: Diagnosis: Major depressive disorder.   Patient was seen by consultant.   Consult note dictated.   Recommend further assessment or treatment.   Orders entered.   Comments: [name] has a h/o depression, anxiety and substance abuse. He was hospitalized for depression 5/4-5/11. He was given 7 day supoply of medications from the hospital. He returned 1 day following discharge complaining of restless feeling. He was advised to stop Abilify. He returns to the ER again complaining of anxiety and restless feeling.  [name] has not followed up with [facility] as planned. He refused to talk to [facility] representative at the homeless shelter where he now resides.   PLAN: 1. The patient does not meet criteria for admission. Please discharge as appropriate.  2. He is to stop all psychiatric medications. He was given 7 day supply and should be out by now. He has no money to fill his prescriptions anyway.   3. In the unlikely event that he experiences akathysia fro Abilify still, benadryl is the treatment.   4. He is to continue Septra prescribed by Dr. Sampson GoonFitzgerald and follow up with ID clinic.  Electronic Signatures: Kristine LineaPucilowska, Ravi Tuccillo (MD)  (Signed 15-May-15 13:40)  Authored: Brief Consult Note   Last Updated: 15-May-15 13:40 by Kristine LineaPucilowska, Rocio Roam (MD)

## 2014-09-08 NOTE — Consult Note (Signed)
Brief Consult Note: Diagnosis: Major depressive disorder.   Patient was seen by consultant.   Recommend further assessment or treatment.   Orders entered.   Comments: Will admit to psychiatry.  Electronic Signatures: Kristine LineaPucilowska, Chelle Cayton (MD)  (Signed [phone]-May-15 13:51)  Authored: Brief Consult Note   Last Updated: 04-May-15 13:51 by Kristine LineaPucilowska, Mikolaj Woolstenhulme (MD)

## 2014-09-08 NOTE — Consult Note (Signed)
PATIENT NAME:  Mason Meadows, CANNATA MR#:  [id number] DATE OF BIRTH:  27-Jul-1975  DATE OF CONSULTATION:  09/20/2013  REFERRING PHYSICIAN:  Orson Slick, MD CONSULTING PHYSICIAN:  Cheral Marker. Ola Spurr, MD  REASON FOR CONSULTATION:  HIV.  HISTORY OF PRESENT ILLNESS:  This is a 39 year old gentleman with a history of HIV who has not been in medical care for several years. He states he was initially diagnosed with HIV around 6 or 7 years ago and was followed in [place] at [facility] for 1 year on therapy. He then fell out of care. He was readmitted in December and says he was there for 1 month at [facility] where he states he was quite ill. He had a severe pneumonia. At that time, he was started back on medications; however, he has not been taking them. He does report he was started on Bactrim at that time but, again, has not been taking it. He was readmitted with polysubstance abuse, as well as depression.   Currently, he denies any chest pain, cough, shortness of breath, headache, thrush, difficulty swallowing, abdominal pain, nausea, vomiting or diarrhea.   PAST MEDICAL HISTORY: 1.  HIV,  2.  Pneumonia, 2014, December.   PAST PSYCHIATRIC HISTORY: None, except for polysubstance abuse.   FAMILY HISTORY: Mother with bipolar.   MEDICATIONS AS AN OUTPATIENT:  None.   ALLERGIES: AMOXICILLIN, PENICILLIN, TRAMADOL AND ULTRAM.   SOCIAL HISTORY: The patient uses multiple drugs. He is currently not in a relationship. He has no support. He does smoke.   REVIEW OF SYSTEMS:  Eleven systems reviewed and negative except as per HPI.   PHYSICAL EXAMINATION: VITAL SIGNS: Temperature 98.2, pulse 82, blood pressure 117/84, respirations 20, sats not done.  GENERAL:  He is thin, in no acute distress.  HEENT:  Pupils equal, round and reactive to light and accommodation. Extraocular movements are intact. Sclerae are anicteric. His oropharynx is clear with no thrush.  NECK:  Supple.  HEART:  Regular.   LUNGS:  Clear to auscultation bilaterally.  ABDOMEN:  Soft, nontender, nondistended. No hepatosplenomegaly.  EXTREMITIES:  No clubbing, cyanosis or edema.  NEUROLOGIC:  Cranial nerves II through XII are intact. Grossly neurologically intact.  SKIN:  He has evidence of tinea over his face and beard.   LABORATORY DATA: Reviewed. CBC, C-met are normal. Urinalysis is negative. Urine drug screen is positive for cocaine. Ethanol level was negative. Tylenol level negative. Salicylates 2.5.   IMPRESSION AND PLAN: A 39 year old admitted to the psych ward with polysubstance abuse and depression and suicidal ideation. He also has a history of HIV.  1. Human immunodeficiency virus. We will continue to monitor him off medications. I have sent a CD4 and viral load. I will discuss with Robie Ridge, the social worker at [facility], to see if she visit the patient while he is here and help arrange followup at the [facility] where we have an HIV clinic 1 day a month. He can be plugged there as well for primary care.   Would continue to hold on medications for prophylactic at this time until we see what his CD4 is.   2.  For his tinea on his face, I have placed him on ketoconazole shampoo and instructed him on its use.   Thank you for the consult. I will be glad to follow with you.   ____________________________ Cheral Marker. Ola Spurr, MD dpf:dmm D: 09/20/2013 15:24:46 ET T: 09/20/2013 19:17:41 ET JOB#: [id number]  cc: Cheral Marker. Ola Spurr, MD, <Dictator>  Adrian Prows MD ELECTRONICALLY SIGNED 09/20/2013 21:54

## 2014-09-16 NOTE — H&P (Signed)
PATIENT NAME:  Mason Meadows, Mason Meadows MR#:  [id number] DATE OF BIRTH:  Apr 23, 1976  DATE OF ADMISSION:  09/08/2014  CHIEF COMPLAINT: The patient states that he realized "if I didn't seek help" something was going to happen bad. He states that it was going to be he either would end up hurting himself or he would get into legal trouble secondary to alcohol. He indicates that he presented voluntarily to the [facility] Emergency Department. He verbalized suicidal ideation. He states that he feels overwhelmed with life. He states he has anxiety. He states it is very hard for him to organize his thoughts. He states that his drinking escalated heavily 2 months ago. He states that he typically will drink as much as he can get until he blacks out. He endorses difficulty sleeping. He does state that he has issues with low energy and low motivation; however, he states that when he is not drinking that he does well with these things. He states he has a decreased appetite when he is drinking. He is endorsing suicidal ideation and hopelessness. He denies any auditory hallucinations or visual hallucinations.    He still struggles with health issues. He is HIV positive and then relates that he does take his HIV medications, but sometimes has not been completely compliant with them. He states there have been social stressors with trying to find a place to live that he finds suitable. He has had recent hospitalizations for pneumonia 1 month ago at [facility] and then was hospitalized for some chest pain issues and [place] 2 weeks ago.   PAST PSYCHIATRIC HISTORY: The patient reports a prior admission to this facility 1 year ago for depression. He states he was placed on some Abilify but it made him sick. He did have some followup with [facility], but he states that he was with them for about 1 month and he felt it did not help him. He states cocaine use is occasional. He states that he will use it if someone has it around him, but then he does  not use it regularly. He states an example may be he might use it 3 times a week and then he may not use any more for a month. He denies ever having suicide attempts outside of what he has been told he does when he is intoxicated. He states that he has been told when he is intoxicated he has tried to grab a knife or run into traffic. He states he does not remember these events. He does state he has had a prior trial of Lexapro and Zoloft.   PAST MEDICAL HISTORY: HIV, lower back surgery 4 years ago, right hip replacement 4 years ago. HIV diagnosed 5 to 6 years ago.   MEDICATIONS: Zithromax 600 mg tablets 2 tablets once a week, Flexeril 5 mg 1 tablet 3 times a day as needed, Tivicay 50 mg tablets 1 tablet daily, Truvada 200-300 mg tablet daily, Diflucan 200 mg tablet 1 tablet daily, prednisone 20 mg tablet 1 tablet daily, Bactrim double strength 2 tablets 3 times daily, albuterol 2.5 mg/3 mL 0.083% nebulizer solution 3 mL by nebulizer every 2 hours as needed for wheezing.    ALLERGIES: PENICILLIN, TORADOL, AND ULTRAM.  FAMILY PSYCHIATRIC ILLNESSES: The patient states that his mother, maternal aunt, and maternal cousin are taking medications for depression.   SOCIAL HISTORY: The patient states he has never been married and has no children. He states he grew up in [place]. He has a tenth grade education.  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 He states he has been on disability for the past 5 years secondary to his back. He states that he has been moving around trying to find a suitable place to live and that [place] and then that did not work out and then he went to [place] and then more recently he has moved back to [place].   REVIEW OF SYSTEMS: The patient endorses shortness of breath when he moves around. He does endorse night sweats. He endorses diarrhea and constipation. He does endorse a headache. He endorses poor appetite when he is drinking.    MENTAL STATUS EXAMINATION: The patient is a young male appearing his stated age of 39. He is alert and oriented x 4. He is calm and pleasant. He makes good eye contact. There are no abnormal movements. His thought process was linear and goal-directed. Thought content: No suicidal ideation, no homicidal ideation, no auditory hallucinations, no visual hallucinations, no delusional thinking. His mood is okay. His affect is slightly depressed, but he is able to smile and reflect upon his problems with alcohol. His insight and judgment appear to be fair. His remote memory is intact as evident by his ability to provide detailed history of past events and treatment. His immediate memory is intact as evident by his ability to discuss more recent events and circumstances leading to this hospitalization.   PHYSICAL EXAMINATION:  VITAL SIGNS: Temperature 98.3, pulse 76, respirations 20, blood pressure 116/75.  MUSCULOSKELETAL: His gait was normal and motor strength was grossly intact.   LABORATORY RESULTS: Review of laboratory results from outside hospital: Urine drug screen positive for cocaine. CBC with differential was within normal limits except for an RDW of 17.7. His electrolytes were within normal limits with the exception of a mildly elevated total bilirubin of 1.4. His alcohol level was less than 5.    DIAGNOSES: 1.  Alcohol use disorder, severe.  2.  Substance-induced mood disorder, anxiety disorder unspecified.   ASSESSMENT AND PLAN: We will continue the patient on CIWA protocol; however, given his amount of drinking I am going to change CIWA to a standing Librium taper. We will start Celexa as patient seems to have some underlying hopelessness, anxiety, and suicidal ideation, aside from his periods of intoxication.    ____________________________ Loralie ChampagneAlton L. Mayford KnifeWilliams, MD alw:at D: 09/08/2014 11:37:40 ET T: 09/08/2014 11:51:09 ET JOB#: [id number]  cc: Leory PlowmanAlton L. Mayford KnifeWilliams, MD, <Dictator> Kerin SalenALTON L  Analiza Cowger MD ELECTRONICALLY SIGNED 09/12/2014 13:02

## 2014-12-05 ENCOUNTER — Emergency Department (HOSPITAL_COMMUNITY)
Admission: EM | Admit: 2014-12-05 | Discharge: 2014-12-06 | Disposition: A | Discharge status: Other Acute Inpatient Hospital | Payer: Medicare Other | Attending: Emergency Medicine | Admitting: Emergency Medicine

## 2014-12-05 ENCOUNTER — Encounter (HOSPITAL_COMMUNITY): Payer: Self-pay | Admitting: Emergency Medicine

## 2014-12-05 DIAGNOSIS — Z880 Allergy status to penicillin: Secondary | ICD-10-CM | POA: Insufficient documentation

## 2014-12-05 DIAGNOSIS — F419 Anxiety disorder, unspecified: Secondary | ICD-10-CM | POA: Insufficient documentation

## 2014-12-05 DIAGNOSIS — Z720 Tobacco use: Secondary | ICD-10-CM | POA: Diagnosis not present

## 2014-12-05 DIAGNOSIS — F329 Major depressive disorder, single episode, unspecified: Secondary | ICD-10-CM | POA: Diagnosis not present

## 2014-12-05 DIAGNOSIS — F39 Unspecified mood [affective] disorder: Secondary | ICD-10-CM | POA: Diagnosis present

## 2014-12-05 DIAGNOSIS — Z8701 Personal history of pneumonia (recurrent): Secondary | ICD-10-CM | POA: Diagnosis not present

## 2014-12-05 DIAGNOSIS — B20 Human immunodeficiency virus [HIV] disease: Secondary | ICD-10-CM | POA: Diagnosis not present

## 2014-12-05 DIAGNOSIS — R45851 Suicidal ideations: Secondary | ICD-10-CM | POA: Diagnosis not present

## 2014-12-05 DIAGNOSIS — Z79899 Other long term (current) drug therapy: Secondary | ICD-10-CM | POA: Diagnosis not present

## 2014-12-05 DIAGNOSIS — Z760 Encounter for issue of repeat prescription: Secondary | ICD-10-CM | POA: Diagnosis not present

## 2014-12-05 DIAGNOSIS — R110 Nausea: Secondary | ICD-10-CM | POA: Insufficient documentation

## 2014-12-05 DIAGNOSIS — F1010 Alcohol abuse, uncomplicated: Secondary | ICD-10-CM

## 2014-12-05 HISTORY — DX: Unspecified mood (affective) disorder: F39

## 2014-12-05 HISTORY — DX: Major depressive disorder, single episode, unspecified: F32.9

## 2014-12-05 HISTORY — DX: Depression, unspecified: F32.A

## 2014-12-05 LAB — COMPREHENSIVE METABOLIC PANEL
ALK PHOS: 53 U/L (ref 38–126)
ALT: 36 U/L (ref 17–63)
ANION GAP: 11 (ref 5–15)
AST: 30 U/L (ref 15–41)
Albumin: 5.1 g/dL — ABNORMAL HIGH (ref 3.5–5.0)
BUN: 13 mg/dL (ref 6–20)
CHLORIDE: 105 mmol/L (ref 101–111)
CO2: 28 mmol/L (ref 22–32)
Calcium: 9.5 mg/dL (ref 8.9–10.3)
Creatinine, Ser: 1.01 mg/dL (ref 0.61–1.24)
GFR calc non Af Amer: >60 mL/min (ref >60)
Glucose, Bld: 129 mg/dL — ABNORMAL HIGH (ref 65–99)
Potassium: 3.6 mmol/L (ref 3.5–5.1)
SODIUM: 144 mmol/L (ref 135–145)
TOTAL PROTEIN: 8.1 g/dL (ref 6.5–8.1)
Total Bilirubin: 0.6 mg/dL (ref 0.3–1.2)

## 2014-12-05 LAB — URINE MICROSCOPIC-ADD ON

## 2014-12-05 LAB — CBC WITH DIFFERENTIAL/PLATELET
BASOS ABS: 0.1 10*3/uL (ref 0.0–0.1)
BASOS PCT: 1 % (ref 0–1)
EOS ABS: 0.1 10*3/uL (ref 0.0–0.7)
Eosinophils Relative: 1 % (ref 0–5)
HEMATOCRIT: 44.7 % (ref 39.0–52.0)
HEMOGLOBIN: 15.6 g/dL (ref 13.0–17.0)
LYMPHS ABS: 3.4 10*3/uL (ref 0.7–4.0)
Lymphocytes Relative: 42 % (ref 12–46)
MCH: 31.6 pg (ref 26.0–34.0)
MCHC: 34.9 g/dL (ref 30.0–36.0)
MCV: 90.7 fL (ref 78.0–100.0)
MONO ABS: 0.4 10*3/uL (ref 0.1–1.0)
Monocytes Relative: 5 % (ref 3–12)
Neutro Abs: 4.2 10*3/uL (ref 1.7–7.7)
Neutrophils Relative %: 51 % (ref 43–77)
Platelets: 189 10*3/uL (ref 150–400)
RBC: 4.93 MIL/uL (ref 4.22–5.81)
RDW: 12.1 % (ref 11.5–15.5)
WBC: 8.2 10*3/uL (ref 4.0–10.5)

## 2014-12-05 LAB — URINALYSIS, ROUTINE W REFLEX MICROSCOPIC
Bilirubin Urine: NEGATIVE
Glucose, UA: NEGATIVE mg/dL
HGB URINE DIPSTICK: NEGATIVE
Ketones, ur: NEGATIVE mg/dL
NITRITE: NEGATIVE
Protein, ur: NEGATIVE mg/dL
SPECIFIC GRAVITY, URINE: 1.022 (ref 1.005–1.030)
Urobilinogen, UA: 0.2 mg/dL (ref 0.0–1.0)
pH: 7 (ref 5.0–8.0)

## 2014-12-05 LAB — ETHANOL: Alcohol, Ethyl (B): <5 mg/dL (ref <5)

## 2014-12-05 LAB — RAPID URINE DRUG SCREEN, HOSP PERFORMED
AMPHETAMINES: NOT DETECTED
BARBITURATES: NOT DETECTED
Benzodiazepines: NOT DETECTED
Cocaine: NOT DETECTED
Opiates: NOT DETECTED
TETRAHYDROCANNABINOL: NOT DETECTED

## 2014-12-05 MED ORDER — ALUM & MAG HYDROXIDE-SIMETH 200-200-20 MG/5ML PO SUSP: 30.0000 mL | ORAL | Status: DC | PRN

## 2014-12-05 MED ORDER — LORAZEPAM 1 MG PO TABS
1.0000 mg | ORAL_TABLET | Freq: Three times a day (TID) | ORAL | Status: DC | PRN
  Administered 2014-12-05 – 2014-12-06 (×2): 1 mg via ORAL
  Filled 2014-12-05 (×3): qty 1

## 2014-12-05 MED ORDER — NICOTINE 21 MG/24HR TD PT24
21.0000 mg | MEDICATED_PATCH | Freq: Every day | TRANSDERMAL | Status: DC
  Administered 2014-12-06: 21 mg via TRANSDERMAL
  Filled 2014-12-05: qty 1

## 2014-12-05 MED ORDER — EMTRICITABINE-TENOFOVIR DF 200-300 MG PO TABS
1.0000 | ORAL_TABLET | Freq: Every day | ORAL | Status: DC
  Administered 2014-12-05 – 2014-12-06 (×2): 1 via ORAL
  Filled 2014-12-05 (×2): qty 1

## 2014-12-05 MED ORDER — AMPHETAMINE-DEXTROAMPHETAMINE 10 MG PO TABS
30.0000 mg | ORAL_TABLET | Freq: Two times a day (BID) | ORAL | Status: DC
  Administered 2014-12-06 (×2): 30 mg via ORAL
  Filled 2014-12-05 (×2): qty 3

## 2014-12-05 MED ORDER — ONDANSETRON HCL 4 MG PO TABS: 4.0000 mg | ORAL_TABLET | Freq: Three times a day (TID) | ORAL | Status: DC | PRN

## 2014-12-05 MED ORDER — ACETAMINOPHEN 325 MG PO TABS
650.0000 mg | ORAL_TABLET | ORAL | Status: DC | PRN
  Administered 2014-12-05: 650 mg via ORAL
  Filled 2014-12-05 (×2): qty 2

## 2014-12-05 MED ORDER — DOLUTEGRAVIR SODIUM 50 MG PO TABS
50.0000 mg | ORAL_TABLET | Freq: Every day | ORAL | Status: DC
  Administered 2014-12-05 – 2014-12-06 (×2): 50 mg via ORAL
  Filled 2014-12-05 (×4): qty 1

## 2014-12-05 MED ORDER — TRAZODONE HCL 100 MG PO TABS
100.0000 mg | ORAL_TABLET | Freq: Every day | ORAL | Status: DC
  Administered 2014-12-05: 100 mg via ORAL
  Filled 2014-12-05: qty 1

## 2014-12-05 MED ORDER — GABAPENTIN 400 MG PO CAPS
1200.0000 mg | ORAL_CAPSULE | Freq: Three times a day (TID) | ORAL | Status: DC
  Administered 2014-12-05 – 2014-12-06 (×2): 1200 mg via ORAL
  Filled 2014-12-05 (×2): qty 3

## 2014-12-05 MED ORDER — BUPROPION HCL ER (SR) 150 MG PO TB12
150.0000 mg | ORAL_TABLET | Freq: Two times a day (BID) | ORAL | Status: DC
  Administered 2014-12-05 – 2014-12-06 (×2): 150 mg via ORAL
  Filled 2014-12-05 (×3): qty 1

## 2014-12-05 NOTE — ED Notes (Signed)
Bed: WTR7 Expected date:  Expected time:  Means of arrival:  Comments: EMS-med refill

## 2014-12-05 NOTE — ED Notes (Signed)
Bed: WLPT4 Expected date:  Expected time:  Means of arrival:  Comments: Pt still in room  

## 2014-12-05 NOTE — ED Notes (Addendum)
Pt stated,"I have just been feeling overwhelmed and came in here before I did anything. " pt is very pleasant and cooperative and contracts for safety. He denies Si and HI. Pt was given peanut butter crackers to eat and a gingerale. Marland Kitchen. He does c/o a headache. Medication to be given. 6:30p-Pt stated,"I feel calmer now. " Pt is eating his dinner now.

## 2014-12-05 NOTE — ED Notes (Signed)
Unable to collect labs at this time patient is with TTS

## 2014-12-05 NOTE — BH Assessment (Signed)
Assessment Note  Mason Meadows is an 39 y.o. male with history of anxiety and depression. Patient presents to [facility] after being discharged from jail today. Sts that he was in jail for stealing a beer. Prior to going to jail patient was living in a [facility] located in [place]. Sts that he was doing well, taking his HIV meds, and receiving therapy. Patient sts that since he was arrested and went to jail he fears that he loss his housing in [place]. Patient has been without (depression and HIV) meds for 2 weeks. Patient also reports other stressors such as divorcing his partner 10 yrs ago, healing for a "total hip replacement", and hanging in "trap houses". Patient reports suicidal ideations with no plan. He denies suicidal plan or self mutilation. Patient reports depressive symptoms including loss of interest in usual pleasures, fatigue, and crying spells. He denies HI and AVH's. Patient has a history of alcohol and crack cocaine. Last use of alcohol and drugs was 1 year ago. Patient reports a history of inpatient hospitalization at [facility] and [facility]. He has a Paramedic at [employer]".    Axis I: Depressive Disorder NOS Axis II: Deferred Axis III:  Past Medical History  Diagnosis Date  . Pneumonia   . Anxiety   . AIDS (acquired immune deficiency syndrome)   . Depression    Axis IV: other psychosocial or environmental problems, problems related to social environment, problems with access to health care services and problems with primary support group Axis V: 31-40 impairment in reality testing  Past Medical History:  Past Medical History  Diagnosis Date  . Pneumonia   . Anxiety   . AIDS (acquired immune deficiency syndrome)   . Depression     Past Surgical History  Procedure Laterality Date  . Back surgery    . Total hip arthroplasty      Family History:  Family History  Problem Relation Age of Onset  . Hypertension Mother   . Diabetes Mellitus II  Mother   . Hypertension Father   . Diabetes Mellitus II Father     Social History:  reports that he has been smoking Cigarettes.  He has a 10 pack-year smoking history. He has never used smokeless tobacco. He reports that he does not drink alcohol or use illicit drugs.  Additional Social History:  Alcohol / Drug Use Pain Medications: SEE MAR Prescriptions: SEE MAR Over the Counter: SEE MAR History of alcohol / drug use?: Yes (Pt denies current use but has a hx of alcohol/ cocaine use; last use 1 month ago)  CIWA: CIWA-Ar BP: 127/71 mmHg Pulse Rate: 72 COWS:    Allergies:  Allergies  Allergen Reactions  . Amoxicillin Hives  . Ketorolac Hives  . Penicillins Hives  . Tape     Blisters come up wherever tape is.  . Tramadol     Seizures    Home Medications:  (Not in a hospital admission)  OB/GYN Status:  No LMP for male patient.  General Assessment Data [facility] Is this a Tele or Face-to-Face Assessment?: Face-to-Face Is this an Initial Assessment or a Re-assessment for this encounter?: Initial Assessment Marital status: Single Is patient pregnant?: No Pregnancy Status: No Living Arrangements: Alone Can pt return to current living arrangement?: No Admission Status: Involuntary Is patient capable of signing voluntary admission?: No Referral Source: Self/Family/Friend Insurance type: Medicare     Crisis Care Plan Living Arrangements: Alone Name of Psychiatrist:  (no  psychiatrist ) Name of Therapist: no therapist   Education Status Is patient currently in school?: No Current Grade:  (n/a) Highest grade of school patient has completed:  (n/a) Name of school:  (n/a) Contact person:  (n/a)  Risk to self with the past 6 months Suicidal Ideation: No Has patient been a risk to self within the past 6 months prior to admission? : No Suicidal Intent: No Has patient had any suicidal intent within the past 6 months prior to admission? : No Is  patient at risk for suicide?: No Suicidal Plan?: No Has patient had any suicidal plan within the past 6 months prior to admission? : No Access to Means: No What has been your use of drugs/alcohol within the last 12 months?:  (patient denies current use; reports a hx of alcohol/cocaine) Previous Attempts/Gestures: No How many times?:  (0) Other Self Harm Risks:  (n/a) Triggers for Past Attempts: Other (Comment) (n/a) Intentional Self Injurious Behavior: None Family Suicide History: No Recent stressful life event(s): Divorce, Loss (Comment), Legal Issues, Financial Problems, Other (Comment) (homeless, no support system, needs to get back on meds) Persecutory voices/beliefs?: No Depression: Yes Depression Symptoms: Feeling angry/irritable, Feeling worthless/self pity, Loss of interest in usual pleasures, Fatigue, Guilt, Isolating, Insomnia, Tearfulness, Despondent Substance abuse history and/or treatment for substance abuse?: No Suicide prevention information given to non-admitted patients: Not applicable  Risk to Others within the past 6 months Homicidal Ideation: No Does patient have any lifetime risk of violence toward others beyond the six months prior to admission? : No Thoughts of Harm to Others: No Current Homicidal Intent: No Current Homicidal Plan: No Access to Homicidal Means: No Identified Victim:  (n/a) History of harm to others?: No Assessment of Violence: None Noted Violent Behavior Description:  (patient calm and cooperative ) Does patient have access to weapons?: No Criminal Charges Pending?: No Does patient have a court date: No Is patient on probation?: No  Psychosis Hallucinations: None noted Delusions: None noted  Mental Status Report Appearance/Hygiene: In scrubs Eye Contact: Good Motor Activity: Freedom of movement Speech: Logical/coherent Level of Consciousness: Alert Mood: Depressed Affect: Appropriate to circumstance Anxiety Level: None Thought  Processes: Coherent, Relevant Judgement: Impaired Orientation: Person, Time, Situation, Place Obsessive Compulsive Thoughts/Behaviors: None  Cognitive Functioning Concentration: Decreased Memory: Recent Intact, Remote Intact IQ: Average Insight: Poor Impulse Control: Poor Appetite: Fair Weight Loss:  (none reported) Weight Gain:  (none reported) Sleep: No Change Total Hours of Sleep:  (varies ) Vegetative Symptoms: None  ADLScreening [facility]) Patient's cognitive ability adequate to safely complete daily activities?: Yes Patient able to express need for assistance with ADLs?: Yes Independently performs ADLs?: Yes (appropriate for developmental age)  Prior Inpatient Therapy Prior Inpatient Therapy: Yes Prior Therapy Dates:  [facility] Prior Therapy Facilty/Provider(s):  ([facility] and [facility]) Reason for Treatment:  (substance abuse and depression)  Prior Outpatient Therapy Prior Outpatient Therapy: Yes Prior Therapy Dates:  (current) Prior Therapy Facilty/Provider(s):  [facility] Reason for Treatment:  (medication managment) Does patient have an ACCT team?: No Does patient have Intensive In-House Services?  : No Does patient have [facility] services? : No Does patient have P4CC services?: No  ADL Screening (condition at time of admission) Patient's cognitive ability adequate to safely complete daily activities?: Yes Is the patient deaf or have difficulty hearing?: No Does the patient have difficulty seeing, even when wearing glasses/contacts?: No Does the patient have difficulty concentrating, remembering, or making decisions?: Yes Patient able to express need  for assistance with ADLs?: Yes Does the patient have difficulty dressing or bathing?: No Independently performs ADLs?: Yes (appropriate for developmental age) Does the patient have difficulty walking or climbing stairs?: No Weakness of Legs: None Weakness of Arms/Hands: None  Home Assistive  Devices/Equipment Home Assistive Devices/Equipment: None    Abuse/Neglect Assessment (Assessment to be complete while patient is alone) Physical Abuse: Denies Verbal Abuse: Denies Sexual Abuse: Denies Exploitation of patient/patient's resources: Denies Self-Neglect: Denies Values / Beliefs Cultural Requests During Hospitalization: None Spiritual Requests During Hospitalization: None   Advance Directives (For Healthcare) Does patient have an advance directive?: No Nutrition Screen- [facility] Patient's home diet: Regular  Additional Information 1:1 In Past 12 Months?: No CIRT Risk: No Elopement Risk: No Does patient have medical clearance?: Yes     Disposition:  Disposition Initial Assessment Completed for this Encounter: Yes (Psychiatrist will re-evaluate in the morning ) Disposition of Patient: Treatment offered and refused (Per Nanine Means, NP patient to remain in the ED overnight) Other disposition(s): Other (Comment) (Per Catha Nottingham, NP patient to remain in the ED overnight for ob)  On Site Evaluation by:   Reviewed with Physician:    Melynda Ripple [facility] 12/05/2014 5:56 PM

## 2014-12-05 NOTE — ED Notes (Signed)
Pt with TTS for consult.

## 2014-12-05 NOTE — ED Notes (Signed)
Bed: WA31 Expected date:  Expected time:  Means of arrival:  Comments: 

## 2014-12-05 NOTE — ED Notes (Signed)
Pt states that he has been off all meds, including HIV medications, for over two weeks.  He has recently experienced a loss of support and his living situation has been disrupted, so he is beginning to feel hopeless.  He has a history of suicidal ideation and is hoping to be evaluated and treated for his current depression/SI before he reaches a point of complete hopelessness.  He denies a plan for suicide at this time and is calm and cooperative during the assessment, although he verbalized that he felt like he might cry.

## 2014-12-05 NOTE — ED Notes (Signed)
Bed: WTR7 Expected date:  Expected time:  Means of arrival:  Comments: 

## 2014-12-05 NOTE — ED Notes (Signed)
MD at bedside. 

## 2014-12-05 NOTE — ED Notes (Signed)
Patient offered Tylenol for headache, declined at this time. Patient advised to notify staff if he feels like he needs additional Tylenol for headache.

## 2014-12-05 NOTE — ED Notes (Signed)
Per EMS, pt requests Welbutrin, Adderall, and antihypertensive medication refill. Per EMS, pt released from 1.5 week jail stay to group home in [place] where he was being seen by PCP. Pt has been off of antihypertensives while in jail, pt was attempting to walk to [facility] but called EMS because he could not make it.

## 2014-12-05 NOTE — ED Provider Notes (Signed)
CSN: [id number]     Arrival date & time 12/05/14  1506 History   First MD Initiated Contact with Patient 12/05/14 1614     Chief Complaint  Patient presents with  . Suicidal    off medicartion x 16 days  . Nausea    2 week hx of nausea  . Headache    2 week hx of headache  . Medication Refill    Pt is concerned that lack of medications may make him suicidal     (Consider location/radiation/quality/duration/timing/severity/associated sxs/prior Treatment) HPI Comments: Patient here complaining of suicidal ideations related to lack of his medications. Was just released from jail due to an outstanding warrants. Has a history of suicide attempt in the past. His plan for suicide at this time is to overload self-medicating says with alcohol and drugs. Denies any command hallucinations. Does endorse depression and hopelessness. Denies any hallucinations. Symptoms have been gradually worse since the gel today. No treatment is prior to arrival  Patient is a 39 y.o. male presenting with headaches. The history is provided by the patient.  Headache   Past Medical History  Diagnosis Date  . Pneumonia   . Anxiety   . AIDS (acquired immune deficiency syndrome)   . Depression    Past Surgical History  Procedure Laterality Date  . Back surgery    . Total hip arthroplasty     Family History  Problem Relation Age of Onset  . Hypertension Mother   . Diabetes Mellitus II Mother   . Hypertension Father   . Diabetes Mellitus II Father    History  Substance Use Topics  . Smoking status: Current Every Day Smoker -- 1.00 packs/day for 10 years    Types: Cigarettes  . Smokeless tobacco: Never Used  . Alcohol Use: No    Review of Systems  Neurological: Positive for headaches.  All other systems reviewed and are negative.     Allergies  Amoxicillin; Ketorolac; Penicillins; Tape; and Tramadol  Home Medications   Prior to Admission medications   Medication Sig Start Date End Date  Taking? Authorizing Provider  amphetamine-dextroamphetamine (ADDERALL) 30 MG tablet Take 30 mg by mouth 2 (two) times daily.   Yes Historical Provider, MD  buPROPion (WELLBUTRIN SR) 150 MG 12 hr tablet Take 150 mg by mouth 2 (two) times daily.   Yes Historical Provider, MD  dolutegravir (TIVICAY) 50 MG tablet Take 1 tablet (50 mg total) by mouth daily. 09/03/14  Yes Jerald Kief, MD  emtricitabine-tenofovir (TRUVADA) 200-300 MG per tablet Take 1 tablet by mouth daily. 09/03/14  Yes Jerald Kief, MD  gabapentin (NEURONTIN) 600 MG tablet Take 1,200 mg by mouth 3 (three) times daily.   Yes Historical Provider, MD  traZODone (DESYREL) 100 MG tablet Take 100 mg by mouth at bedtime.   Yes Historical Provider, MD  albuterol (PROVENTIL) (2.5 MG/3ML) 0.083% nebulizer solution Take 3 mLs (2.5 mg total) by nebulization every 2 (two) hours as needed for wheezing. Patient not taking: Reported on 08/31/2014 06/02/13   Stephani Police, PA-C  azithromycin (ZITHROMAX) 600 MG tablet Take 2 tablets (1,200 mg total) by mouth once a week. Patient not taking: Reported on 12/05/2014 09/03/14   Jerald Kief, MD  cyclobenzaprine (FLEXERIL) 5 MG tablet Take 1 tablet (5 mg total) by mouth 3 (three) times daily as needed for muscle spasms (muscle ache). Patient not taking: Reported on 12/05/2014 09/03/14   Jerald Kief, MD  fluconazole (DIFLUCAN) 200 MG tablet Take  1 tablet (200 mg total) by mouth daily. Patient not taking: Reported on 12/05/2014 09/03/14   Jerald KiefStephen K Chiu, MD  predniSONE (DELTASONE) 20 MG tablet Take 1 tablet (20 mg total) by mouth daily. Patient not taking: Reported on 12/05/2014 09/03/14   Jerald KiefStephen K Chiu, MD  sulfamethoxazole-trimethoprim (BACTRIM DS,SEPTRA DS) 800-160 MG per tablet Take 2 tablets by mouth 3 (three) times daily. Patient not taking: Reported on 12/05/2014 09/03/14   Jerald KiefStephen K Chiu, MD   BP 124/81 mmHg  Pulse 70  Temp(Src) 98.8 F (37.1 C) (Oral)  Resp 18  SpO2 98% Physical Exam   Constitutional: He is oriented to person, place, and time. He appears well-developed and well-nourished.  Non-toxic appearance. No distress.  HENT:  Head: Normocephalic and atraumatic.  Eyes: Conjunctivae, EOM and lids are normal. Pupils are equal, round, and reactive to light.  Neck: Normal range of motion. Neck supple. No tracheal deviation present. No thyroid mass present.  Cardiovascular: Normal rate, regular rhythm and normal heart sounds.  Exam reveals no gallop.   No murmur heard. Pulmonary/Chest: Effort normal and breath sounds normal. No stridor. No respiratory distress. He has no decreased breath sounds. He has no wheezes. He has no rhonchi. He has no rales.  Abdominal: Soft. Normal appearance and bowel sounds are normal. He exhibits no distension. There is no tenderness. There is no rebound and no CVA tenderness.  Musculoskeletal: Normal range of motion. He exhibits no edema or tenderness.  Neurological: He is alert and oriented to person, place, and time. He has normal strength. No cranial nerve deficit or sensory deficit. GCS eye subscore is 4. GCS verbal subscore is 5. GCS motor subscore is 6.  Skin: Skin is warm and dry. No abrasion and no rash noted.  Psychiatric: He has a normal mood and affect. His speech is normal and behavior is normal. He expresses suicidal ideation. He expresses suicidal plans.  Nursing note and vitals reviewed.   ED Course  Procedures (including critical care time) Labs Review Labs Reviewed  ETHANOL  URINE RAPID DRUG SCREEN, HOSP PERFORMED  CBC WITH DIFFERENTIAL/PLATELET  COMPREHENSIVE METABOLIC PANEL  URINALYSIS, ROUTINE W REFLEX MICROSCOPIC (NOT AT [facility])    Imaging Review No results found.   EKG Interpretation None      MDM   Final diagnoses:  None    Patient to be medically cleared and seen by psychiatry    Lorre NickAnthony Erinne Gillentine, MD 12/05/14 1630

## 2014-12-06 ENCOUNTER — Encounter (HOSPITAL_COMMUNITY): Payer: Self-pay | Admitting: Nurse Practitioner

## 2014-12-06 ENCOUNTER — Encounter (HOSPITAL_COMMUNITY): Payer: Self-pay | Admitting: *Deleted

## 2014-12-06 ENCOUNTER — Inpatient Hospital Stay (HOSPITAL_COMMUNITY)
Admission: AD | Admit: 2014-12-06 | Discharge: 2014-12-19 | DRG: 885 | Disposition: A | Discharge status: Home / Self Care | Payer: Medicare Other | Source: Intra-hospital | Attending: Psychiatry | Admitting: Psychiatry

## 2014-12-06 DIAGNOSIS — R45851 Suicidal ideations: Secondary | ICD-10-CM

## 2014-12-06 DIAGNOSIS — F39 Unspecified mood [affective] disorder: Secondary | ICD-10-CM | POA: Diagnosis not present

## 2014-12-06 DIAGNOSIS — Z96649 Presence of unspecified artificial hip joint: Secondary | ICD-10-CM | POA: Diagnosis present

## 2014-12-06 DIAGNOSIS — F1010 Alcohol abuse, uncomplicated: Secondary | ICD-10-CM | POA: Diagnosis present

## 2014-12-06 DIAGNOSIS — Z833 Family history of diabetes mellitus: Secondary | ICD-10-CM

## 2014-12-06 DIAGNOSIS — Z590 Homelessness: Secondary | ICD-10-CM | POA: Diagnosis not present

## 2014-12-06 DIAGNOSIS — Z8249 Family history of ischemic heart disease and other diseases of the circulatory system: Secondary | ICD-10-CM

## 2014-12-06 DIAGNOSIS — F1410 Cocaine abuse, uncomplicated: Secondary | ICD-10-CM | POA: Diagnosis present

## 2014-12-06 DIAGNOSIS — F329 Major depressive disorder, single episode, unspecified: Secondary | ICD-10-CM | POA: Diagnosis not present

## 2014-12-06 DIAGNOSIS — G4700 Insomnia, unspecified: Secondary | ICD-10-CM | POA: Diagnosis present

## 2014-12-06 DIAGNOSIS — F410 Panic disorder [episodic paroxysmal anxiety] without agoraphobia: Secondary | ICD-10-CM | POA: Diagnosis present

## 2014-12-06 DIAGNOSIS — B20 Human immunodeficiency virus [HIV] disease: Secondary | ICD-10-CM | POA: Diagnosis present

## 2014-12-06 DIAGNOSIS — K219 Gastro-esophageal reflux disease without esophagitis: Secondary | ICD-10-CM | POA: Diagnosis present

## 2014-12-06 DIAGNOSIS — F3130 Bipolar disorder, current episode depressed, mild or moderate severity, unspecified: Secondary | ICD-10-CM | POA: Diagnosis present

## 2014-12-06 DIAGNOSIS — F909 Attention-deficit hyperactivity disorder, unspecified type: Secondary | ICD-10-CM | POA: Diagnosis present

## 2014-12-06 DIAGNOSIS — F319 Bipolar disorder, unspecified: Secondary | ICD-10-CM | POA: Diagnosis present

## 2014-12-06 DIAGNOSIS — F17210 Nicotine dependence, cigarettes, uncomplicated: Secondary | ICD-10-CM | POA: Diagnosis present

## 2014-12-06 DIAGNOSIS — L719 Rosacea, unspecified: Secondary | ICD-10-CM | POA: Diagnosis present

## 2014-12-06 HISTORY — DX: Unspecified mood (affective) disorder: F39

## 2014-12-06 MED ORDER — DOLUTEGRAVIR SODIUM 50 MG PO TABS
50.0000 mg | ORAL_TABLET | Freq: Every day | ORAL | Status: DC
  Administered 2014-12-07 – 2014-12-19 (×13): 50 mg via ORAL
  Filled 2014-12-06 (×15): qty 1

## 2014-12-06 MED ORDER — ONDANSETRON HCL 4 MG PO TABS: 4.0000 mg | ORAL_TABLET | Freq: Three times a day (TID) | ORAL | Status: DC | PRN

## 2014-12-06 MED ORDER — BUPROPION HCL ER (SR) 150 MG PO TB12
150.0000 mg | ORAL_TABLET | Freq: Two times a day (BID) | ORAL | Status: DC
  Administered 2014-12-06 – 2014-12-07 (×3): 150 mg via ORAL
  Filled 2014-12-06 (×7): qty 1

## 2014-12-06 MED ORDER — EMTRICITABINE-TENOFOVIR DF 200-300 MG PO TABS
1.0000 | ORAL_TABLET | Freq: Every day | ORAL | Status: DC
  Administered 2014-12-07 – 2014-12-19 (×13): 1 via ORAL
  Filled 2014-12-06 (×15): qty 1

## 2014-12-06 MED ORDER — LORAZEPAM 1 MG PO TABS
1.0000 mg | ORAL_TABLET | Freq: Three times a day (TID) | ORAL | Status: DC | PRN
  Administered 2014-12-06 – 2014-12-07 (×2): 1 mg via ORAL
  Filled 2014-12-06 (×2): qty 1

## 2014-12-06 MED ORDER — NICOTINE 21 MG/24HR TD PT24
21.0000 mg | MEDICATED_PATCH | Freq: Every day | TRANSDERMAL | Status: DC
  Administered 2014-12-07 – 2014-12-18 (×12): 21 mg via TRANSDERMAL
  Filled 2014-12-06 (×17): qty 1

## 2014-12-06 MED ORDER — GABAPENTIN 400 MG PO CAPS
1200.0000 mg | ORAL_CAPSULE | Freq: Three times a day (TID) | ORAL | Status: DC
  Administered 2014-12-06 – 2014-12-19 (×39): 1200 mg via ORAL
  Filled 2014-12-06 (×46): qty 3

## 2014-12-06 MED ORDER — ALUM & MAG HYDROXIDE-SIMETH 200-200-20 MG/5ML PO SUSP
30.0000 mL | ORAL | Status: DC | PRN
  Administered 2014-12-10 – 2014-12-11 (×3): 30 mL via ORAL
  Filled 2014-12-06 (×3): qty 30

## 2014-12-06 MED ORDER — AMPHETAMINE-DEXTROAMPHETAMINE 10 MG PO TABS
30.0000 mg | ORAL_TABLET | Freq: Two times a day (BID) | ORAL | Status: DC
  Administered 2014-12-07 – 2014-12-10 (×8): 30 mg via ORAL
  Filled 2014-12-06 (×8): qty 3

## 2014-12-06 MED ORDER — ACETAMINOPHEN 325 MG PO TABS
650.0000 mg | ORAL_TABLET | ORAL | Status: DC | PRN
  Administered 2014-12-07 – 2014-12-10 (×4): 650 mg via ORAL
  Filled 2014-12-06 (×5): qty 2

## 2014-12-06 MED ORDER — TRAZODONE HCL 100 MG PO TABS
100.0000 mg | ORAL_TABLET | Freq: Every day | ORAL | Status: DC
  Administered 2014-12-06: 100 mg via ORAL
  Filled 2014-12-06 (×3): qty 1

## 2014-12-06 MED ORDER — HYDROCORTISONE 0.5 % EX CREA
TOPICAL_CREAM | Freq: Two times a day (BID) | CUTANEOUS | Status: DC
  Administered 2014-12-06: 13:00:00 via TOPICAL
  Filled 2014-12-06: qty 28.35

## 2014-12-06 MED ORDER — IBUPROFEN 800 MG PO TABS
800.0000 mg | ORAL_TABLET | Freq: Once | ORAL | Status: AC
  Administered 2014-12-06: 800 mg via ORAL
  Filled 2014-12-06: qty 1

## 2014-12-06 NOTE — ED Notes (Signed)
Patient states he would like to leave AMA. Spoke to psych team and ED MD Estell Harpin) in reference to patient leaving AMA. Both physician teams are agreeable to patient leaving AMA, patient does not need to be IVC'd. Approached patient requesting him to come to desk and sign for his AMA, patient states "do I have to go right now?" Patient was advised that if he is not staying to complete evaluation and treatment then he will have to leave at this time. Patient states he wants to see a different psych doctor, states the psych dr was rude and he felt as though he was being interrogated by him. This nurse apologized, stating "I am sorry that you feel that way". Patient was advised this is the psychiatric doctor who is available today to see him, patient was also advised there is no guarantee that tomorrow he will see a different doctor. Patient states he would like to stay for continued evaluation. Patient given the phone number to service response per his request to "talk to someone about this situation".

## 2014-12-06 NOTE — ED Notes (Signed)
counselor at bedside. 

## 2014-12-06 NOTE — Progress Notes (Signed)
Patient did attend the evening karaoke group. Pt was engaged, supportive, and participated by singing a song.  

## 2014-12-06 NOTE — Consult Note (Signed)
[facility] Consult   Reason for Consult:  MOOD DISORDER, DEPRESSIVE MOOD DISORDER. Referring Physician:  EDP [name] MRN:  [id number] Principal Diagnosis: Mood disorder Diagnosis:   Patient Active Problem List   Diagnosis Date Noted  . Mood disorder [F39] 12/06/2014    Priority: High  . Thrush [B37.0]   . 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 Noncompliance [Z91.19]   . Neutropenic fever [D70.9] 08/31/2014  . Fever [R50.9] 08/31/2014  . Chest pain [R07.9] 08/31/2014  . Pancytopenia [D61.818] 08/31/2014  . HIV (human immunodeficiency virus infection) [Z21] 08/31/2014  . Acute respiratory failure with hypoxia [J96.01] 05/31/2013  . Presumed Pneumocystosis pneumonia [B59] 05/31/2013  . Severe protein-calorie malnutrition [E43] 05/31/2013  . AIDS [B20] 05/31/2013    Total Time spent with patient: 1 hour  Subjective:   Mason Meadows is a 39 y.o. male patient admitted with [place].  HPI:  Caucasian male, 39 years old was evaluated for suicidal ideation with no plans.  Patient feels hopeless and helpless and stated that he lost his place of staying [place].  Patient usually receives care at [facility] center in [place].  He reported that he was arrested for stealing a beer in another county.  He also stated that the case has been dropped but was taken to jail for that.  He admitted to past Psychiatric diagnosis of Adderall and Depression and was taking Wellbutrin SR for that.  He is not able to contract for safety and have been accepted for admission. Patient has a bed assigned and will be transported as soon as transport is available.  He denies HI/AVH.  HPI Elements:   Location:  Mood disorder, depressive disorder,. Quality:  severe, feels hopeless and helpless. Severity:  severe. Timing:  Acute. Duration:  Sudden. Context:  Seeking housing and endoring suicide.  Past Medical  History:  Past Medical History  Diagnosis Date  . Pneumonia   . Anxiety   . AIDS (acquired immune deficiency syndrome)   . Depression   . Mood disorder 12/06/2014    Past Surgical History  Procedure Laterality Date  . Back surgery    . Total hip arthroplasty     Family History:  Family History  Problem Relation Age of Onset  . Hypertension Mother   . Diabetes Mellitus II Mother   . Hypertension Father   . Diabetes Mellitus II Father    Social History:  History  Alcohol Use No     History  Drug Use No    Comment: last time -2 months ago    History   Social History  . Marital Status: Single    Spouse Name: N/A  . Number of Children: N/A  . Years of Education: N/A   Social History Main Topics  . Smoking status: Current Every Day Smoker -- 1.00 packs/day for 10 years    Types: Cigarettes  . Smokeless tobacco: Never Used  . Alcohol Use: No  . Drug Use: No     Comment: last time -2 months ago  . Sexual Activity: Yes     Comment: Hasn't smoked   Other Topics Concern  . None   Social History Narrative   Additional Social History:    Pain Medications: SEE MAR Prescriptions: SEE MAR Over the Counter: SEE MAR History of alcohol / drug use?: Yes (Pt denies current use but has a hx of alcohol/ cocaine use; last use 1 month ago)  Allergies:   Allergies  Allergen Reactions  . Amoxicillin Hives  . Ketorolac Hives  . Penicillins Hives  . Tape     Blisters come up wherever tape is.  . Tramadol     Seizures    Labs:  Results for orders placed or performed during the hospital encounter of 12/05/14 (from the past 48 hour(s))  Urine rapid drug screen (hosp performed)     Status: None   Collection Time: 12/05/14  4:37 PM  Result Value Ref Range   Opiates NONE DETECTED NONE DETECTED   Cocaine NONE DETECTED NONE DETECTED   Benzodiazepines NONE DETECTED NONE DETECTED   Amphetamines NONE DETECTED NONE DETECTED   Tetrahydrocannabinol  NONE DETECTED NONE DETECTED   Barbiturates NONE DETECTED NONE DETECTED    Comment:        DRUG SCREEN FOR MEDICAL PURPOSES ONLY.  IF CONFIRMATION IS NEEDED FOR ANY PURPOSE, NOTIFY LAB WITHIN 5 DAYS.        LOWEST DETECTABLE LIMITS FOR URINE DRUG SCREEN Drug Class       Cutoff (ng/mL) Amphetamine      1000 Barbiturate      200 Benzodiazepine   200 Tricyclics       300 Opiates          300 Cocaine          300 THC              50   Urinalysis, Routine w reflex microscopic (not at [facility])     Status: Abnormal   Collection Time: 12/05/14  4:37 PM  Result Value Ref Range   Color, Urine YELLOW YELLOW   APPearance CLEAR CLEAR   Specific Gravity, Urine 1.022 1.005 - 1.030   pH 7.0 5.0 - 8.0   Glucose, UA NEGATIVE NEGATIVE mg/dL   Hgb urine dipstick NEGATIVE NEGATIVE   Bilirubin Urine NEGATIVE NEGATIVE   Ketones, ur NEGATIVE NEGATIVE mg/dL   Protein, ur NEGATIVE NEGATIVE mg/dL   Urobilinogen, UA 0.2 0.0 - 1.0 mg/dL   Nitrite NEGATIVE NEGATIVE   Leukocytes, UA SMALL (A) NEGATIVE  Urine microscopic-add on     Status: None   Collection Time: 12/05/14  4:37 PM  Result Value Ref Range   WBC, UA 3-6 <3 WBC/hpf   RBC / HPF 0-2 <3 RBC/hpf   Urine-Other MUCOUS PRESENT   Ethanol     Status: None   Collection Time: 12/05/14  5:14 PM  Result Value Ref Range   Alcohol, Ethyl (B) <5 <5 mg/dL    Comment:        LOWEST DETECTABLE LIMIT FOR SERUM ALCOHOL IS 5 mg/dL FOR MEDICAL PURPOSES ONLY   CBC with Differential/Platelet     Status: None   Collection Time: 12/05/14  5:14 PM  Result Value Ref Range   WBC 8.2 4.0 - 10.5 K/uL   RBC 4.93 4.22 - 5.81 MIL/uL   Hemoglobin 15.6 13.0 - 17.0 g/dL   HCT 42.0 91.1 - 53.5 %   MCV 90.7 78.0 - 100.0 fL   MCH 31.6 26.0 - 34.0 pg   MCHC 34.9 30.0 - 36.0 g/dL   RDW 14.8 25.9 - 83.7 %   Platelets 189 150 - 400 K/uL   Neutrophils Relative % 51 43 - 77 %   Neutro Abs 4.2 1.7 - 7.7 K/uL   Lymphocytes Relative 42 12 - 46 %   Lymphs Abs 3.4 0.7 - 4.0  K/uL   Monocytes Relative 5 3 - 12 %  Monocytes Absolute 0.4 0.1 - 1.0 K/uL   Eosinophils Relative 1 0 - 5 %   Eosinophils Absolute 0.1 0.0 - 0.7 K/uL   Basophils Relative 1 0 - 1 %   Basophils Absolute 0.1 0.0 - 0.1 K/uL  Comprehensive metabolic panel     Status: Abnormal   Collection Time: 12/05/14  5:14 PM  Result Value Ref Range   Sodium 144 135 - 145 mmol/L   Potassium 3.6 3.5 - 5.1 mmol/L   Chloride 105 101 - 111 mmol/L   CO2 28 22 - 32 mmol/L   Glucose, Bld 129 (H) 65 - 99 mg/dL   BUN 13 6 - 20 mg/dL   Creatinine, Ser 1.01 0.61 - 1.24 mg/dL   Calcium 9.5 8.9 - 10.3 mg/dL   Total Protein 8.1 6.5 - 8.1 g/dL   Albumin 5.1 (H) 3.5 - 5.0 g/dL   AST 30 15 - 41 U/L   ALT 36 17 - 63 U/L   Alkaline Phosphatase 53 38 - 126 U/L   Total Bilirubin 0.6 0.3 - 1.2 mg/dL   GFR calc non Af Amer >60 >60 mL/min   GFR calc Af Amer >60 >60 mL/min    Comment: (NOTE) The eGFR has been calculated using the CKD EPI equation. This calculation has not been validated in all clinical situations. eGFR's persistently <60 mL/min signify possible Chronic Kidney Disease.    Anion gap 11 5 - 15    Vitals: Blood pressure 140/84, pulse 91, temperature 97.9 F (36.6 C), temperature source Oral, resp. rate 17, SpO2 97 %.  Risk to Self: Suicidal Ideation: No Suicidal Intent: No Is patient at risk for suicide?: No Suicidal Plan?: No Access to Means: No What has been your use of drugs/alcohol within the last 12 months?:  (patient denies current use; reports a hx of alcohol/cocaine) How many times?:  (0) Other Self Harm Risks:  (n/a) Triggers for Past Attempts: Other (Comment) (n/a) Intentional Self Injurious Behavior: None Risk to Others: Homicidal Ideation: No Thoughts of Harm to Others: No Current Homicidal Intent: No Current Homicidal Plan: No Access to Homicidal Means: No Identified Victim:  (n/a) History of harm to others?: No Assessment of Violence: None Noted Violent Behavior Description:   (patient calm and cooperative ) Does patient have access to weapons?: No Criminal Charges Pending?: No Does patient have a court date: No Prior Inpatient Therapy: Prior Inpatient Therapy: Yes Prior Therapy Dates:  [facility] [place] and [facility]) Prior Therapy Facilty/Provider(s):  ([facility] and [facility]) Reason for Treatment:  (substance abuse and depression) Prior Outpatient Therapy: Prior Outpatient Therapy: Yes Prior Therapy Dates:  (current) Prior Therapy Facilty/Provider(s):  [facility]) Reason for Treatment:  (medication managment) Does patient have an ACCT team?: No Does patient have Intensive In-House Services?  : No Does patient have [facility] services? : No Does patient have P4CC services?: No  Current Facility-Administered Medications  Medication Dose Route Frequency Provider Last Rate Last Dose  . acetaminophen (TYLENOL) tablet 650 mg  650 mg Oral Q4H PRN Lacretia Leigh, MD   650 mg at 12/05/14 1743  . alum & mag hydroxide-simeth (MAALOX/MYLANTA) 200-200-20 MG/5ML suspension 30 mL  30 mL Oral PRN Lacretia Leigh, MD      . amphetamine-dextroamphetamine (ADDERALL) tablet 30 mg  30 mg Oral BID WC Wandra Arthurs, MD   30 mg at 12/06/14 1159  . buPROPion [facility] SR) 12 hr tablet 150 mg  150 mg Oral BID Wandra Arthurs, MD   150 mg at 12/06/14  8938  . dolutegravir (TIVICAY) tablet 50 mg  50 mg Oral Daily Wandra Arthurs, MD   50 mg at 12/06/14 1156  . emtricitabine-tenofovir (TRUVADA) 200-300 MG per tablet 1 tablet  1 tablet Oral Daily Wandra Arthurs, MD   1 tablet at 12/06/14 0940  . gabapentin (NEURONTIN) capsule 1,200 mg  1,200 mg Oral TID Wandra Arthurs, MD   1,200 mg at 12/06/14 0940  . hydrocortisone cream 0.5 %   Topical BID Milton Ferguson, MD      . LORazepam (ATIVAN) tablet 1 mg  1 mg Oral Q8H PRN Lacretia Leigh, MD   1 mg at 12/06/14 0941  . nicotine (NICODERM CQ - dosed in mg/24 hours) patch 21 mg  21 mg Transdermal Daily Lacretia Leigh, MD   21 mg at 12/06/14 0939  . ondansetron (ZOFRAN) tablet 4 mg  4  mg Oral Q8H PRN Lacretia Leigh, MD      . traZODone (DESYREL) tablet 100 mg  100 mg Oral QHS Wandra Arthurs, MD   100 mg at 12/05/14 2239   Current Outpatient Prescriptions  Medication Sig Dispense Refill  . amphetamine-dextroamphetamine (ADDERALL) 30 MG tablet Take 30 mg by mouth 2 (two) times daily.    Marland Kitchen buPROPion (WELLBUTRIN SR) 150 MG 12 hr tablet Take 150 mg by mouth 2 (two) times daily.    . dolutegravir (TIVICAY) 50 MG tablet Take 1 tablet (50 mg total) by mouth daily. 30 tablet 0  . emtricitabine-tenofovir (TRUVADA) 200-300 MG per tablet Take 1 tablet by mouth daily. 30 tablet 0  . gabapentin (NEURONTIN) 600 MG tablet Take 1,200 mg by mouth 3 (three) times daily.    . traZODone (DESYREL) 100 MG tablet Take 100 mg by mouth at bedtime.    Marland Kitchen albuterol (PROVENTIL) (2.5 MG/3ML) 0.083% nebulizer solution Take 3 mLs (2.5 mg total) by nebulization every 2 (two) hours as needed for wheezing. (Patient not taking: Reported on 08/31/2014) 75 mL 12  . azithromycin (ZITHROMAX) 600 MG tablet Take 2 tablets (1,200 mg total) by mouth once a week. (Patient not taking: Reported on 12/05/2014) 30 tablet 0  . cyclobenzaprine (FLEXERIL) 5 MG tablet Take 1 tablet (5 mg total) by mouth 3 (three) times daily as needed for muscle spasms (muscle ache). (Patient not taking: Reported on 12/05/2014) 30 tablet 0  . fluconazole (DIFLUCAN) 200 MG tablet Take 1 tablet (200 mg total) by mouth daily. (Patient not taking: Reported on 12/05/2014) 30 tablet 0  . predniSONE (DELTASONE) 20 MG tablet Take 1 tablet (20 mg total) by mouth daily. (Patient not taking: Reported on 12/05/2014) 11 tablet 0  . sulfamethoxazole-trimethoprim (BACTRIM DS,SEPTRA DS) 800-160 MG per tablet Take 2 tablets by mouth 3 (three) times daily. (Patient not taking: Reported on 12/05/2014) 66 tablet 0    Musculoskeletal: Strength & Muscle Tone: within normal limits Gait & Station: normal Patient leans: N/A  Psychiatric Specialty Exam: Physical Exam  Review  of Systems  Constitutional: Negative.   HENT: Negative.   Eyes: Negative.   Respiratory: Negative.   Cardiovascular: Negative.   Gastrointestinal: Negative.   Genitourinary: Negative.   Musculoskeletal: Negative.   Skin: Negative.   Neurological: Negative.   Endo/Heme/Allergies: Negative.     Blood pressure 140/84, pulse 91, temperature 97.9 F (36.6 C), temperature source Oral, resp. rate 17, SpO2 97 %.There is no weight on file to calculate BMI.  General Appearance: Casual and Fairly Groomed  Eye Contact::  Good  Speech:  Clear and Coherent and  Normal Rate  Volume:  Normal  Mood:  Angry, Anxious, Depressed and Irritable  Affect:  Congruent, Depressed and Flat  Thought Process:  Coherent, Goal Directed and Intact  Orientation:  Full (Time, Place, and Person)  Thought Content:  WDL  Suicidal Thoughts:  Yes.  without intent/plan  Homicidal Thoughts:  No  Memory:  Immediate;   Good Recent;   Good Remote;   Good  Judgement:  Poor  Insight:  Shallow  Psychomotor Activity:  Normal  Concentration:  Fair  Recall:  NA  Fund of Knowledge:Poor  Language: Good  Akathisia:  NA  Handed:  Right  AIMS (if indicated):     Assets:  Desire for Improvement Financial Resources/Insurance Housing  ADL's:  Intact  Cognition: WNL  Sleep:      Medical Decision Making: New problem, with additional work up planned  Treatment Plan Summary: Daily contact with patient to assess and evaluate symptoms and progress in treatment and Medication management  Plan: Transfer to inpatient and resume all medications.  Disposition: Admit and a bed has been assigned waiting for transportataion  Delfin Gant   PMHNP-BC 12/06/2014 12:34 PM Patient seen face-to-face for psychiatric evaluation, chart reviewed and case discussed with the physician extender and developed treatment plan. Reviewed the information documented and agree with the treatment plan. Corena Pilgrim, MD

## 2014-12-06 NOTE — BH Assessment (Signed)
Patient reportedly asked to discharge from [facility] but later recanted asking to stay. Patient seemingly able to discharge with no safety issues if he is able to return to a 24 hour residential facility for homeless men in [address]" [phone]. Patient provided consent to contact this facility . Writer did contact the facility and spoke to nursing director Leland Johns. Sts that patient's bed is no longer available. Sts that they only hold beds for 3 days unless a patient contacts the facility to request a longer bed hold. Patient subsequently did not contact the facility, per [facility]. Patient is ok to return to the facility when a bed becomes available. Judeth Cornfield was unable to provide a time frame or date stating that the LCSW "Meghan has that information". Unfortunately Lindie Spruce is on vacation and not scheduled to return back to work until next Tuesday. Patient made aware of the above information.

## 2014-12-06 NOTE — Progress Notes (Signed)
Pt admitted voluntary with si thoughts to drink and use cocaine excessively. Pt reports that he wants a medication adjustment due to being off of his meds while in jail. Pt reports no outside support. He has a handicap sister in [place] a brother in [place] that he has no contact with and his parents have passed away. Four years ago,pt had a breakup with his partner of ten years. Pt had been living in [place] where he had housing and access to medication. He came to [facility] and was arrested. He wants to return to [facility] following discharge. Pt has a medical hx of migraines, seizures with ultram and HIV. He had pneumonia twice.

## 2014-12-06 NOTE — Tx Team (Signed)
Initial Interdisciplinary Treatment Plan   PATIENT STRESSORS: Loss of breakup four years ago Medication change or noncompliance   PATIENT STRENGTHS: Ability for insight General fund of knowledge   PROBLEM LIST: Problem List/Patient Goals Date to be addressed Date deferred Reason deferred Estimated date of resolution  anxiety 12/06/14     Med adjustment 12/06/14     si 12/06/14     Hx of substance abuse 12/06/14                                    DISCHARGE CRITERIA:  Ability to meet basic life and health needs Adequate post-discharge living arrangements Improved stabilization in mood, thinking, and/or behavior Medical problems require only outpatient monitoring Motivation to continue treatment in a less acute level of care Need for constant or close observation no longer present Reduction of life-threatening or endangering symptoms to within safe limits Safe-care adequate arrangements made Verbal commitment to aftercare and medication compliance  PRELIMINARY DISCHARGE PLAN: Placement in alternative living arrangements  PATIENT/FAMIILY INVOLVEMENT: This treatment plan has been presented to and reviewed with the patient, Mason Meadows, and/or family member, .  The patient and family have been given the opportunity to ask questions and make suggestions.  Mason Meadows 12/06/2014, 4:30 PM

## 2014-12-06 NOTE — ED Notes (Signed)
[facility] arrived. Patient is ambulatory with transport service. All belongings sent with transfer service.

## 2014-12-06 NOTE — ED Notes (Signed)
Psych MD at bedside

## 2014-12-07 ENCOUNTER — Encounter (HOSPITAL_COMMUNITY): Payer: Self-pay | Admitting: Psychiatry

## 2014-12-07 DIAGNOSIS — F329 Major depressive disorder, single episode, unspecified: Secondary | ICD-10-CM

## 2014-12-07 DIAGNOSIS — F3130 Bipolar disorder, current episode depressed, mild or moderate severity, unspecified: Secondary | ICD-10-CM | POA: Diagnosis present

## 2014-12-07 DIAGNOSIS — F1410 Cocaine abuse, uncomplicated: Secondary | ICD-10-CM

## 2014-12-07 DIAGNOSIS — F1010 Alcohol abuse, uncomplicated: Secondary | ICD-10-CM | POA: Diagnosis present

## 2014-12-07 MED ORDER — QUETIAPINE FUMARATE 100 MG PO TABS
100.0000 mg | ORAL_TABLET | Freq: Every day | ORAL | Status: DC
  Administered 2014-12-07 – 2014-12-09 (×3): 100 mg via ORAL
  Filled 2014-12-07 (×5): qty 1

## 2014-12-07 MED ORDER — IBUPROFEN 800 MG PO TABS
ORAL_TABLET | ORAL | Status: AC
  Filled 2014-12-07: qty 1

## 2014-12-07 MED ORDER — BUPROPION HCL ER (XL) 300 MG PO TB24
300.0000 mg | ORAL_TABLET | Freq: Every day | ORAL | Status: DC
  Administered 2014-12-08 – 2014-12-10 (×3): 300 mg via ORAL
  Filled 2014-12-07 (×5): qty 1

## 2014-12-07 MED ORDER — LORAZEPAM 1 MG PO TABS
1.0000 mg | ORAL_TABLET | Freq: Four times a day (QID) | ORAL | Status: DC | PRN
  Administered 2014-12-07 – 2014-12-09 (×7): 1 mg via ORAL
  Filled 2014-12-07 (×7): qty 1

## 2014-12-07 MED ORDER — IBUPROFEN 800 MG PO TABS
800.0000 mg | ORAL_TABLET | Freq: Four times a day (QID) | ORAL | Status: DC | PRN
  Administered 2014-12-07 – 2014-12-09 (×2): 800 mg via ORAL
  Filled 2014-12-07 (×2): qty 1

## 2014-12-07 MED ORDER — TRAZODONE HCL 100 MG PO TABS
200.0000 mg | ORAL_TABLET | Freq: Every evening | ORAL | Status: DC | PRN
  Administered 2014-12-07 – 2014-12-17 (×7): 200 mg via ORAL
  Filled 2014-12-07 (×5): qty 2
  Filled 2014-12-07: qty 28
  Filled 2014-12-07 (×4): qty 2

## 2014-12-07 MED ORDER — TRAZODONE HCL 100 MG PO TABS: 200.0000 mg | ORAL_TABLET | Freq: Every day | ORAL | Status: DC

## 2014-12-07 MED ORDER — QUETIAPINE FUMARATE 100 MG PO TABS
100.0000 mg | ORAL_TABLET | Freq: Once | ORAL | Status: AC
  Administered 2014-12-07: 100 mg via ORAL
  Filled 2014-12-07 (×2): qty 1

## 2014-12-07 NOTE — B[facility] Group Notes (Signed)
   [facility] LCSW Aftercare Discharge Planning Group Note  12/07/2014  8:45 AM   Participation Quality: Alert, Appropriate and Oriented  Mood/Affect: Appropriate  Depression Rating: Did not want to rate symptoms at this time  Anxiety Rating: Did not want to rate symptoms at this time  Thoughts of Suicide: Pt denies SI/HI  Will you contract for safety? Yes  Current AVH: Pt denies  Plan for Discharge/Comments: Pt attended discharge planning group and actively participated in group. CSW provided pt with today's workbook. Patient did not want to discuss discharge plans in group.  Transportation Means: Pt reports access to transportation  Supports: No supports mentioned at this time  Samuella Bruin, MSW, [employer] Clinical Social Worker [employer] [phone]

## 2014-12-07 NOTE — Progress Notes (Signed)
Mason Meadows.  Pt pleasant on approach, complaint of persistent headache that he has had for the last two days as well as right hip pain that is chronic for him.  Pt states Tylenol is not helping.  Pt positive for evening AA group, interacting appropriately with peers on the unit.  Passive SI endorsed by Pt but denies HI/hallucinations at this time.  A.  Support and encouragement offered, called NP regarding pain medication request and orders received for Ibuprofen.  R.  Pt pleased with new order and states Ibuprofen did help.  Pt remains safe on the unit, will continue to monitor.

## 2014-12-07 NOTE — Progress Notes (Signed)
D: Pt presents flat in affect and depressed in mood. Pt endorses SI, depression, and anxiety. Pt verbally contracts for safety. Pt reports that he laughs and jokes in a way to mask his depression. Pt noted to be actively participating within the milieu. Pt is compliant with his current POC. Pt appears to be adjusting well to the unit programming.  A: Writer administered scheduled and prn medications to pt, per MD orders. Continued support and availability as needed was extended to this pt. Staff continue to monitor pt with q54min checks.  R: No adverse drug reactions noted. Pt receptive to treatment. Pt remains safe at this time.

## 2014-12-07 NOTE — Progress Notes (Signed)
D:Pt rates his depression as a 9, hopelessness an 8, and anxiety as a 10 on 0-10 scale with 10 being the most. Pt continues to have passive si. He is pleasant and interacts with staff and peers on the unit. A:Offered support, encouragement and 15 minute checks. Gave medications as ordered including prn medication for headache. R:Safety maintained on the unit.

## 2014-12-07 NOTE — Progress Notes (Signed)
Patient ID: Mason Meadows, male   DOB: 08-12-1975, 39 y.o.   MRN: [id number] PER STATE REGULATIONS 482.30  THIS CHART WAS REVIEWED FOR MEDICAL NECESSITY WITH RESPECT TO THE PATIENT'S ADMISSION/DURATION OF STAY.  NEXT REVIEW DATE:12/10/14  Loura Halt, RN, BSN CASE MANAGER

## 2014-12-07 NOTE — BHH Suicide Risk Assessment (Signed)
[facility] INPATIENT:  Family/Significant Other Suicide Prevention Education  Suicide Prevention Education:  Patient Refusal for Family/Significant Other Suicide Prevention Education: The patient Mason Meadows has refused to provide written consent for family/significant other to be provided Family/Significant Other Suicide Prevention Education during admission and/or prior to discharge.  Physician notified. SPE reviewed with patient and brochure provided. Patient encouraged to return to hospital if having suicidal thoughts, patient verbalized his/her understanding and has no further questions at this time.   Jennika Ringgold, [place] 12/07/2014, 4:28 PM

## 2014-12-07 NOTE — Progress Notes (Signed)
Pt attended AA group this evening.  
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
## 2014-12-07 NOTE — Tx Team (Signed)
Interdisciplinary Treatment Plan Update (Adult) Date: 12/07/2014    Time Reviewed: 9:30 AM  Progress in Treatment: Attending groups: Continuing to assess, patient new to milieu Participating in groups: Continuing to assess, patient new to milieu Taking medication as prescribed: Yes Tolerating medication: Yes Family/Significant other contact made: No, CSW assessing for appropriate contacts Patient understands diagnosis: Yes Discussing patient identified problems/goals with staff: Yes Medical problems stabilized or resolved: Yes Denies suicidal/homicidal ideation: Yes Issues/concerns per patient self-inventory: Yes Other:  New problem(s) identified: N/A  Discharge Plan or Barriers: 12/07/2014:  CSW continuing to assess, patient new to milieu.  Reason for Continuation of Hospitalization:  Depression Anxiety Medication Stabilization   Comments: N/A  Estimated length of stay: 3-5 days   Patient is a 39 year old male admitted for depression and anxiety following his release from jail. Patient was living in a recovery house in [place] prior to being arrested. Patient will benefit from crisis stabilization, medication evaluation, group therapy, and psycho education in addition to case management for discharge planning. Patient and CSW reviewed pt's identified goals and treatment plan. Pt verbalized understanding and agreed to treatment plan.     Review of initial/current patient goals per problem list:  1. Goal(s): Patient will participate in aftercare plan   Met: No   Target date: 3-5 days   As evidenced by: Patient will participate within aftercare plan AEB aftercare provider and housing plan at discharge being identified.  7/22: Goal not met: CSW assessing for appropriate referrals for pt and will have follow up secured prior to d/c.    2. Goal (s): Patient will exhibit decreased depressive symptoms and suicidal ideations.   Met: No   Target date: 3-5  days   As evidenced by: Patient will utilize self rating of depression at 3 or below and demonstrate decreased signs of depression or be deemed stable for discharge by MD.  7/22: Goal not met: Pt presents with flat affect and depressed mood.  Pt admitted with depression rating of 10.  Pt to show decreased sign of depression and a rating of 3 or less before d/c.     3. Goal(s): Patient will demonstrate decreased signs and symptoms of anxiety.   Met: No   Target date: 3-5 days   As evidenced by: Patient will utilize self rating of anxiety at 3 or below and demonstrated decreased signs of anxiety, or be deemed stable for discharge by MD  7/22: Goal not met: Pt presents with anxious mood and affect.  Pt admitted with anxiety rating of 10.  Pt to show decreased sign of anxiety and a rating of 3 or less before d/c.    4. Goal(s): Patient will demonstrate decreased signs of withdrawal due to substance abuse   Met: No   Target date: 3-5 day   As evidenced by: Patient will produce a CIWA/COWS score of 0, have stable vitals signs, and no symptoms of withdrawal  7/22: Goal met: No withdrawal symptoms reported at this time per medical chart.     Attendees: Patient:    Family:    Physician: Dr. Parke Poisson; Dr. Sabra Heck 12/07/2014 9:30 AM  Nursing: Marcella Dubs, Desma Paganini  ,RN 12/07/2014 9:30 AM  Clinical Social Worker: Erasmo Downer Abdulahi Schor,  [place] 12/07/2014 9:30 AM  Other: Peri Maris, LCSWA  12/07/2014 9:30 AM  Other: Lucinda Dell, Beverly Sessions Liaison 12/07/2014 9:30 AM  Other:  12/07/2014 9:30 AM  Other: Earleen Newport, May Augustin , NP 12/07/2014 9:30 AM  Other:  Other:    Other:    Other:      Scribe for Treatment Team:  Tilden Fossa, MSW, [place]
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
## 2014-12-07 NOTE — Progress Notes (Signed)
Recreation Therapy Notes  Date: 07.22.16 Time: 9:30 am Location: [address] Group Room  Group Topic: Stress Management  Goal Area(s) Addresses:  Patient will verbalize importance of using healthy stress management.  Patient will identify positive emotions associated with healthy stress management.   Intervention: Stress Management   Activity :  Guided Imagery Script.  LRT introduced and educated patients on stress management  Technique of guided imagery.  A script was used to deliver the technique to patients.  Patients were asked to follow script read a loud by LRT to engage in practicing the stress management technique.  Education:  Stress Management, Discharge Planning.   Education Outcome: Acknowledges edcuation/In group clarification offered/Needs additional education  Clinical Observations/Feedback: Patient did not attend group.   Mechele Kittleson, LRT/CTRS         Katilyn Miltenberger A 12/07/2014 3:35 PM 

## 2014-12-08 ENCOUNTER — Encounter (HOSPITAL_COMMUNITY): Payer: Self-pay | Admitting: Registered Nurse

## 2014-12-08 NOTE — [facility] Group Notes (Signed)
[facility] Group Notes:  (Clinical Social Work)  12/08/2014     10-11AM  Summary of Progress/Problems:   The main focus of today's process group was to learn how to use a decisional balance exercise to move forward in the Stages of Change, which were described and discussed.  Motivational Interviewing and a worksheet were utilized to help patients explore in depth the perceived benefits and costs of unhealthy coping techniques, as well as the  benefits and costs of replacing that with a healthy coping skills.   The patient expressed that their unhealthy coping involves drinking, and he contributed insightful comments and questions throughout group.  Type of Therapy:  Group Therapy - Process   Participation Level:  Active  Participation Quality:  Attentive, Sharing and Supportive  Affect:  Appropriate  Cognitive:  Alert, Appropriate and Oriented  Insight:  Engaged  Engagement in Therapy:  Engaged  Modes of Intervention:  Education, Motivational Interviewing  Ambrose Mantle, LCSW 12/08/2014, 12:44 PM

## 2014-12-08 NOTE — Progress Notes (Signed)
[facility] MD Progress Note  12/08/2014 2:51 PM Mason Meadows  MRN:  [id number]   Subjective:  [place] here from jail and I am doing terrible.  I ain't no pill head.  My right hip is hurting, and it is making me more irritable.    Objective:  Patient seen and chart reviewed.  Patient states that he is tolerating his medications without adverse reactions and attending/participating in group sessions.  Patient states that he continues to have thoughts of suicide. Complaints of right hip pain.  Offered Lidoderm patch and Mobic patient refused.  Stated he preferred pain medication (opiate) informed unless he was previously in pain management that I would not be prescribing opiates that there were other medications that could help with pain other than opiates.  Patient refused.  Patient also asked if his Ativan could be increased to 2 mg bid.  States that it is prescribed by his primary doctor but he only needs to take it twice a day in the morning and at bed time.  Informed patient that I would not be increasing the Ativan to 2 mg but we could try to increase some of his other medications that also helped with anxiety; but patient refused.     Principal Problem: Bipolar I disorder, most recent episode depressed Diagnosis:   Patient Active Problem List   Diagnosis Date Noted  . Alcohol abuse [F10.10] 12/07/2014  . Bipolar I disorder, most recent episode depressed [F31.30] 12/07/2014  . Mood disorder [F39] 12/06/2014  . Thrush [B37.0]   . 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 Noncompliance [Z91.19]   . Neutropenic fever [D70.9] 08/31/2014  . Fever [R50.9] 08/31/2014  . Chest pain [R07.9] 08/31/2014  . Pancytopenia [D61.818] 08/31/2014  . HIV (human immunodeficiency virus infection) [Z21] 08/31/2014  . Acute respiratory failure with hypoxia [J96.01] 05/31/2013  . Presumed Pneumocystosis pneumonia [B59] 05/31/2013  . Severe protein-calorie malnutrition [E43] 05/31/2013  . AIDS [B20]  05/31/2013   Total Time spent with patient: 45 minutes   Past Medical History:  Past Medical History  Diagnosis Date  . Pneumonia   . Anxiety   . AIDS (acquired immune deficiency syndrome)   . Depression   . Mood disorder 12/06/2014    Past Surgical History  Procedure Laterality Date  . Back surgery    . Total hip arthroplasty     Family History:  Family History  Problem Relation Age of Onset  . Hypertension Mother   . Diabetes Mellitus II Mother   . Hypertension Father   . Diabetes Mellitus II Father    Social History:  History  Alcohol Use No     History  Drug Use No    Comment: last time -2 months ago    History   Social History  . Marital Status: Single    Spouse Name: N/A  . Number of Children: N/A  . Years of Education: N/A   Social History Main Topics  . Smoking status: Current Every Day Smoker -- 1.00 packs/day for 10 years    Types: Cigarettes  . Smokeless tobacco: Never Used  . Alcohol Use: No  . Drug Use: No     Comment: last time -2 months ago  . Sexual Activity: Yes     Comment: Hasn't smoked   Other Topics Concern  . None   Social History Narrative   Additional History:    Sleep: Fair  Appetite:  Fair   Assessment:   Musculoskeletal: Strength & Muscle Tone:  within normal limits Gait & Station: normal Patient leans: N/A   Psychiatric Specialty Exam: Physical Exam  Nursing note and vitals reviewed. Constitutional: He is oriented to person, place, and time.  Neck: Normal range of motion.  Respiratory: Effort normal.  Musculoskeletal: Normal range of motion.  Neurological: He is alert and oriented to person, place, and time.    Review of Systems  Constitutional: Positive for malaise/fatigue.  Eyes: Positive for blurred vision.  Cardiovascular: Positive for chest pain and palpitations.  Gastrointestinal: Positive for nausea.  Musculoskeletal: Positive for myalgias, back pain and joint pain.  Skin: Positive for rash.   Neurological: Positive for dizziness, weakness and headaches.  Psychiatric/Behavioral: Positive for depression, suicidal ideas and substance abuse. The patient is nervous/anxious and has insomnia.      Blood pressure 128/83, pulse 104, temperature 97.4 F (36.3 C), temperature source Oral, resp. rate 16, height  (1.753 m), weight 91.627 kg (202 lb).Body mass index is 29.82 kg/(m^2).  General Appearance: Casual  Eye Contact::  Good  Speech:  Clear and Coherent and Normal Rate  Volume:  Normal  Mood:  Anxious  Affect:  Congruent  Thought Process:  Circumstantial and Linear  Orientation:  Full (Time, Place, and Person)  Thought Content:  Rumination  Suicidal Thoughts:  Yes.  without intent/plan  Homicidal Thoughts:  No  Memory:  Immediate;   Good Recent;   Good Remote;   Good  Judgement:  Fair  Insight:  Fair  Psychomotor Activity:  Normal  Concentration:  Fair  Recall:  Good  Fund of Knowledge:Good  Language: Good  Akathisia:  No  Handed:  Right  AIMS (if indicated):     Assets:  Communication Skills Desire for Improvement  ADL's:  Intact  Cognition: WNL  Sleep:  Number of Hours: 6.5     Current Medications: Current Facility-Administered Medications  Medication Dose Route Frequency Provider Last Rate Last Dose  . acetaminophen (TYLENOL) tablet 650 mg  650 mg Oral Q4H PRN Earney Navy, NP   650 mg at 12/07/14 1642  . alum & mag hydroxide-simeth (MAALOX/MYLANTA) 200-200-20 MG/5ML suspension 30 mL  30 mL Oral PRN Earney Navy, NP      . amphetamine-dextroamphetamine (ADDERALL) tablet 30 mg  30 mg Oral BID WC Earney Navy, NP   30 mg at 12/08/14 1610  . buPROPion (WELLBUTRIN XL) 24 hr tablet 300 mg  300 mg Oral Daily Rachael Fee, MD   300 mg at 12/08/14 0810  . dolutegravir (TIVICAY) tablet 50 mg  50 mg Oral Daily Earney Navy, NP   50 mg at 12/08/14 0810  . emtricitabine-tenofovir (TRUVADA) 200-300 MG per tablet 1 tablet  1 tablet Oral Daily  Earney Navy, NP   1 tablet at 12/08/14 0810  . gabapentin (NEURONTIN) capsule 1,200 mg  1,200 mg Oral TID Earney Navy, NP   1,200 mg at 12/08/14 1149  . ibuprofen (ADVIL,MOTRIN) tablet 800 mg  800 mg Oral Q6H PRN Worthy Flank, NP   800 mg at 12/07/14 2132  . LORazepam (ATIVAN) tablet 1 mg  1 mg Oral Q6H PRN Rachael Fee, MD   1 mg at 12/08/14 1149  . nicotine (NICODERM CQ - dosed in mg/24 hours) patch 21 mg  21 mg Transdermal Daily Earney Navy, NP   21 mg at 12/08/14 0811  . ondansetron (ZOFRAN) tablet 4 mg  4 mg Oral Q8H PRN Earney Navy, NP      .  QUEtiapine (SEROQUEL) tablet 100 mg  100 mg Oral QHS Rachael Fee, MD   100 mg at 12/07/14 2113  . traZODone (DESYREL) tablet 200 mg  200 mg Oral QHS PRN Rachael Fee, MD   200 mg at 12/07/14 2216    Lab Results: No results found for this or any previous visit (from the past 48 hour(s)).  Physical Findings: AIMS: Facial and Oral Movements Muscles of Facial Expression: None, normal Lips and Perioral Area: None, normal Jaw: None, normal Tongue: None, normal,Extremity Movements Upper (arms, wrists, hands, fingers): None, normal Lower (legs, knees, ankles, toes): None, normal, Trunk Movements Neck, shoulders, hips: None, normal, Overall Severity Severity of abnormal movements (highest score from questions above): None, normal Incapacitation due to abnormal movements: None, normal Patient's awareness of abnormal movements (rate only patient's report): No Awareness, Dental Status Current problems with teeth and/or dentures?: No Does patient usually wear dentures?: No  CIWA:    COWS:     Treatment Plan Summary: Daily contact with patient to assess and evaluate symptoms and progress in treatment and Medication management  Daily contact with patient to assess and evaluate symptoms and progress in treatment and Medication management Supportive approach/coping skills Depression; resume the Wellbutrin change to XL  300 mg rather than 150 mg SR BID Insomnia; has used Trazodone up to 200 mg HS, complains of ruminative thinking when he goes to bed, will have a trial with Seroquel 100 mg HS Pain/anxiety; will continue the Neurontin 1200 mg TID Alcohol abuse; will work a relapse prevention plan Suicidal ideas; continue to monitor he can contract for safety  Will check to see if he can go back to the house in [facility]  Continue with current medications and treatment plan.  No changes at this time.   Medical Decision Making:  Review of Psycho-Social Stressors (1), Review or order clinical lab tests (1), Review and summation of old records (2), Review of Last Therapy Session (1), Independent Review of image, tracing or specimen (2) and Review of Medication Regimen & Side Effects (2)   Rankin, Shuvon, FNP-BC 12/08/2014, 2:51 PM Patient seen face-to-face for psychiatric evaluation, chart reviewed and case discussed with the physician extender and developed treatment plan. Reviewed the information documented and agree with the treatment plan. Thedore Mins, MD

## 2014-12-08 NOTE — [facility] Group Notes (Signed)
[facility] Group Notes:  (Nursing/MHT/Case Management/Adjunct)  Date:  12/08/2014  Time:  3:28 PM  Type of Therapy:  Nurse Education  Participation Level:  Minimal  Participation Quality:  Appropriate  Affect:  Appropriate  Cognitive:  Alert and Oriented  Insight:  Improving  Engagement in Group:  Improving  Modes of Intervention:  Clarification, Discussion, Education and Socialization  Summary of Progress/Problems:  Mason Meadows 12/08/2014, 3:28 PM

## 2014-12-08 NOTE — Progress Notes (Signed)
D:Per patient self inventory form patient reports he slept good last night with the use of sleep medication. He reports a good appetite, low energy level, poor concentration. He rates depression 8/10, hopelessness 8/10, anxiety 10/10- all on 1-10 scale, 10 being the worse. Patient c/o chronic hip pain. Patient reports his goal for the day is to "Stay positive" He reports he will meet this goal by "be mindful/breathing slow." Pt. Endorses passive SI. He denies AVH. Observed socializing in the day room with peers. Attending group on the unit.   A:Special checks q 15 mins in place for safety. Medication administered per MD order (see eMAR). Encouragement and support provided.  R:Safety maintained, Patient verbally contract for safety. Compliant with medication regimen.Will continue to monitor.

## 2014-12-09 LAB — URINALYSIS, ROUTINE W REFLEX MICROSCOPIC
Bilirubin Urine: NEGATIVE
Glucose, UA: NEGATIVE mg/dL
Hgb urine dipstick: NEGATIVE
KETONES UR: NEGATIVE mg/dL
LEUKOCYTES UA: NEGATIVE
NITRITE: NEGATIVE
PROTEIN: NEGATIVE mg/dL
Specific Gravity, Urine: 1.004 — ABNORMAL LOW (ref 1.005–1.030)
Urobilinogen, UA: 0.2 mg/dL (ref 0.0–1.0)
pH: 6 (ref 5.0–8.0)

## 2014-12-09 MED ORDER — CLINDAMYCIN HCL 300 MG PO CAPS
300.0000 mg | ORAL_CAPSULE | Freq: Three times a day (TID) | ORAL | Status: AC
  Administered 2014-12-09 – 2014-12-16 (×19): 300 mg via ORAL
  Filled 2014-12-09 (×5): qty 1
  Filled 2014-12-09: qty 2
  Filled 2014-12-09 (×9): qty 1
  Filled 2014-12-09: qty 2
  Filled 2014-12-09: qty 1
  Filled 2014-12-09: qty 2
  Filled 2014-12-09 (×4): qty 1
  Filled 2014-12-09: qty 2
  Filled 2014-12-09 (×2): qty 1

## 2014-12-09 MED ORDER — DICLOFENAC SODIUM 1 % TD GEL
2.0000 g | Freq: Four times a day (QID) | TRANSDERMAL | Status: DC
  Administered 2014-12-09 – 2014-12-14 (×5): 2 g via TOPICAL
  Filled 2014-12-09 (×3): qty 100

## 2014-12-09 MED ORDER — CLONAZEPAM 0.5 MG PO TABS
0.5000 mg | ORAL_TABLET | Freq: Three times a day (TID) | ORAL | Status: DC | PRN
  Administered 2014-12-09 – 2014-12-19 (×27): 0.5 mg via ORAL
  Filled 2014-12-09 (×28): qty 1

## 2014-12-09 NOTE — [facility] Group Notes (Signed)
[facility] Group Notes:  (Nursing/MHT/Case Management/Adjunct)  Date:  12/09/2014  Time:  2:15 PM  Type of Therapy:  Nurse Education  Participation Level:  Did Not Attend   Summary of Progress/Problems:  Mason Meadows 12/09/2014, 5:09 PM 

## 2014-12-09 NOTE — Progress Notes (Signed)
Adult Psychoeducational Group Note  Date:  12/09/2014 Time:  10:38 PM  Group Topic/Focus:  Wrap-Up Group:   The focus of this group is to help patients review their daily goal of treatment and discuss progress on daily workbooks.  Participation Level:  Active  Participation Quality:  Appropriate  Affect:  Appropriate  Cognitive:  Alert and Appropriate  Insight: Appropriate and Good  Engagement in Group:  Engaged  Modes of Intervention:  Discussion  Additional Comments:  Patient rated his day a 1 due to a confrontation he had with another patient on another hallway. He stated that his goal was to get back on medication and to prepare for discharge.  Natasha Mead 12/09/2014, 10:38 PM

## 2014-12-09 NOTE — Progress Notes (Signed)
D: Per patient self inventory form patient reports he slept fair last night with the use of sleep medication. He reports a fair appetite, low energy level, poor concentration. He rates depression 8/10, hopelessness 9/10, anxiety 10/10- all on 1-10 scale, 10 being the worse. He reports chronic hip pain. He reports passive SI, denies HI. Denies AVH. He reports he had a bad night but that "today is a new day." He reports his goal for the day  "trying to stay positive." To meet his goal "mediatate and try to slow my thoughts down." Patient took shower. Observed on the unit socializing on peers.   A:Special checks q 15 mins in place for safety. Encouragement and counseling provided. Medication administered per MD order (See eMAR.)  R: Safety maintained. Pt verbally contracts for safety. Compliant with medication regimen. Will continue to monitor.

## 2014-12-09 NOTE — Progress Notes (Signed)
[facility] MD Progress Note  12/09/2014 3:18 PM Mason Meadows  MRN:  [id number]   Subjective: Patient states "I'm anxious, angry, hurting and I can't concentrate; and I'm not sure about my Wellbutrin."  Patient states that he wants to go to [facility] because that is where his doctors are.  "I try to calm down but the doors slamming and the yelling just triggers my anxiety; the AA meeting is just depressing; I am comfortable that I am in a safe place that I can't get anywhere to harm my self."   Objective:  Patient seen and chart reviewed.  Patient continues to states that he is in pain but is will to try something that is suggested other than an opiate today.  Re informed patient that I would not be increasing his Ativan to 2 mg.  Did discuss Klonopin 0.5 mg.  Patient states that another patient on the floor yelling and slamming door is a trigger to his anxiety.    Principal Problem: Bipolar I disorder, most recent episode depressed Diagnosis:   Patient Active Problem List   Diagnosis Date Noted  . Alcohol abuse [F10.10] 12/07/2014  . Bipolar I disorder, most recent episode depressed [F31.30] 12/07/2014  . Mood disorder [F39] 12/06/2014  . Thrush [B37.0]   . 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 Noncompliance [Z91.19]   . Neutropenic fever [D70.9] 08/31/2014  . Fever [R50.9] 08/31/2014  . Chest pain [R07.9] 08/31/2014  . Pancytopenia [D61.818] 08/31/2014  . HIV (human immunodeficiency virus infection) [Z21] 08/31/2014  . Acute respiratory failure with hypoxia [J96.01] 05/31/2013  . Presumed Pneumocystosis pneumonia [B59] 05/31/2013  . Severe protein-calorie malnutrition [E43] 05/31/2013  . AIDS [B20] 05/31/2013   Total Time spent with patient: 45 minutes   Past Medical History:  Past Medical History  Diagnosis Date  . Pneumonia   . Anxiety   . AIDS (acquired immune deficiency syndrome)   . Depression   . Mood disorder 12/06/2014    Past Surgical History  Procedure  Laterality Date  . Back surgery    . Total hip arthroplasty     Family History:  Family History  Problem Relation Age of Onset  . Hypertension Mother   . Diabetes Mellitus II Mother   . Hypertension Father   . Diabetes Mellitus II Father    Social History:  History  Alcohol Use No     History  Drug Use No    Comment: last time -2 months ago    History   Social History  . Marital Status: Single    Spouse Name: N/A  . Number of Children: N/A  . Years of Education: N/A   Social History Main Topics  . Smoking status: Current Every Day Smoker -- 1.00 packs/day for 10 years    Types: Cigarettes  . Smokeless tobacco: Never Used  . Alcohol Use: No  . Drug Use: No     Comment: last time -2 months ago  . Sexual Activity: Yes     Comment: Hasn't smoked   Other Topics Concern  . None   Social History Narrative   Additional History:    Sleep: Fair  Appetite:  Fair   Assessment:   Musculoskeletal: Strength & Muscle Tone: within normal limits Gait & Station: normal Patient leans: N/A   Psychiatric Specialty Exam: Physical Exam  Nursing note and vitals reviewed. Constitutional: He is oriented to person, place, and time.  Neck: Normal range of motion.  Respiratory: Effort normal.  Musculoskeletal:  Normal range of motion.  Neurological: He is alert and oriented to person, place, and time.    Review of Systems  Musculoskeletal: Positive for myalgias, back pain and joint pain.  Skin: Positive for rash.  Psychiatric/Behavioral: Positive for depression, suicidal ideas and substance abuse. The patient is nervous/anxious and has insomnia.   All other systems reviewed and are negative.    Blood pressure 128/83, pulse 104, temperature 97.4 F (36.3 C), temperature source Oral, resp. rate 16, height  (1.753 m), weight 91.627 kg (202 lb).Body mass index is 29.82 kg/(m^2).  General Appearance: Casual and Fairly Groomed  Patent attorney::  Good  Speech:  Clear and  Coherent  Volume:  Normal  Mood:  Anxious and Depressed  Affect:  Congruent  Thought Process:  Circumstantial and Linear  Orientation:  Full (Time, Place, and Person)  Thought Content:  Rumination  Suicidal Thoughts:  Yes.  without intent/plan  Homicidal Thoughts:  No  Memory:  Immediate;   Good Recent;   Good Remote;   Good  Judgement:  Fair  Insight:  Fair  Psychomotor Activity:  Normal  Concentration:  Fair  Recall:  Good  Fund of Knowledge:Good  Language: Good  Akathisia:  No  Handed:  Right  AIMS (if indicated):     Assets:  Communication Skills Desire for Improvement  ADL's:  Intact  Cognition: WNL  Sleep:  Number of Hours: 6.5     Current Medications: Current Facility-Administered Medications  Medication Dose Route Frequency Provider Last Rate Last Dose  . acetaminophen (TYLENOL) tablet 650 mg  650 mg Oral Q4H PRN Earney Navy, NP   650 mg at 12/07/14 1642  . alum & mag hydroxide-simeth (MAALOX/MYLANTA) 200-200-20 MG/5ML suspension 30 mL  30 mL Oral PRN Earney Navy, NP      . amphetamine-dextroamphetamine (ADDERALL) tablet 30 mg  30 mg Oral BID WC Earney Navy, NP   30 mg at 12/09/14 0802  . buPROPion (WELLBUTRIN XL) 24 hr tablet 300 mg  300 mg Oral Daily Rachael Fee, MD   300 mg at 12/09/14 0755  . dolutegravir (TIVICAY) tablet 50 mg  50 mg Oral Daily Earney Navy, NP   50 mg at 12/09/14 0755  . emtricitabine-tenofovir (TRUVADA) 200-300 MG per tablet 1 tablet  1 tablet Oral Daily Earney Navy, NP   1 tablet at 12/09/14 0755  . gabapentin (NEURONTIN) capsule 1,200 mg  1,200 mg Oral TID Earney Navy, NP   1,200 mg at 12/09/14 1143  . ibuprofen (ADVIL,MOTRIN) tablet 800 mg  800 mg Oral Q6H PRN Worthy Flank, NP   800 mg at 12/09/14 0757  . LORazepam (ATIVAN) tablet 1 mg  1 mg Oral Q6H PRN Rachael Fee, MD   1 mg at 12/09/14 1320  . nicotine (NICODERM CQ - dosed in mg/24 hours) patch 21 mg  21 mg Transdermal Daily Earney Navy, NP   21 mg at 12/09/14 0758  . ondansetron (ZOFRAN) tablet 4 mg  4 mg Oral Q8H PRN Earney Navy, NP      . QUEtiapine (SEROQUEL) tablet 100 mg  100 mg Oral QHS Rachael Fee, MD   100 mg at 12/08/14 2111  . traZODone (DESYREL) tablet 200 mg  200 mg Oral QHS PRN Rachael Fee, MD   200 mg at 12/08/14 2111    Lab Results: No results found for this or any previous visit (from the past 48 hour(s)).  Physical  Findings: AIMS: Facial and Oral Movements Muscles of Facial Expression: None, normal Lips and Perioral Area: None, normal Jaw: None, normal Tongue: None, normal,Extremity Movements Upper (arms, wrists, hands, fingers): None, normal Lower (legs, knees, ankles, toes): None, normal, Trunk Movements Neck, shoulders, hips: None, normal, Overall Severity Severity of abnormal movements (highest score from questions above): None, normal Incapacitation due to abnormal movements: None, normal Patient's awareness of abnormal movements (rate only patient's report): No Awareness, Dental Status Current problems with teeth and/or dentures?: No Does patient usually wear dentures?: No  CIWA:    COWS:     Treatment Plan Summary: Daily contact with patient to assess and evaluate symptoms and progress in treatment and Medication management  Daily contact with patient to assess and evaluate symptoms and progress in treatment and Medication management Supportive approach/coping skills Depression; resume the Wellbutrin change to XL 300 mg rather than 150 mg SR BID Insomnia; has used Trazodone up to 200 mg HS, complains of ruminative thinking when he goes to bed, will have a trial with Seroquel 100 mg HS Pain/anxiety; will continue the Neurontin 1200 mg TID Alcohol abuse; will work a relapse prevention plan Suicidal ideas; continue to monitor he can contract for safety  Will check to see if he can go back to the house in [place]  12/09/2014:  Discontinued Ativan 1 mg Q 6 hr prn and  started Klonopin 0.5 mg Tid prn anxiety; Clindamycin 600 mg loading dose and 300 mg Tid for 7 days for tooth abscess and Voltaren Gel 1 % to apply Qid prn to hip and painful area    Continue with current treatment plan.    Medical Decision Making:  Review of Psycho-Social Stressors (1), New Problem, with no additional work-up planned (3), Review of Last Therapy Session (1), Review of Medication Regimen & Side Effects (2) and Review of New Medication or Change in Dosage (2)   Rankin, Shuvon, FNP-BC 12/09/2014, 3:18 PM Patient seen face-to-face for psychiatric evaluation, chart reviewed and case discussed with the physician extender and developed treatment plan. Reviewed the information documented and agree with the treatment plan. Thedore Mins, MD

## 2014-12-09 NOTE — Progress Notes (Signed)
D.  Pt anxious and irritable on approach, states it has been a bad day, very hectic on the hall.  Pt with complaint of continued anxiety and passive suicidal ideation.  Positive for evening AA group, interacting appropriately with peers on the unit.  Denies HI/halluicntions at this time.  Pt does contract for safety.  A.  Support and encouragement offered.  R. Pt remains safe on the unit, will continue to monitor.

## 2014-12-09 NOTE — [facility] Group Notes (Signed)
[facility] Group Notes:  (Clinical Social Work)  12/09/2014  10:00-11:00AM  Summary of Progress/Problems:   The main focus of today's process group was to   1)  discuss the importance of adding supports  2)  define health supports versus unhealthy supports  3)  identify the patient's current unhealthy supports and plan how to handle them  4)  Identify the patient's current healthy supports and plan what to add.  An emphasis was placed on using counselor, doctor, therapy groups, 12-step groups, and problem-specific support groups to expand supports.    The patient expressed full comprehension of the concepts presented, and agreed that there is a need to add more supports.  The patient stated initially that he has no supports, but later said he actually should consider the staff where he is living and his therapist at [facility] as positive supports.  He was able to describe for the other group members what benefit he gets from having a therapist.  He left group early, did not return.  Type of Therapy:  Process Group with Motivational Interviewing  Participation Level:  Active  Participation Quality:  Attentive and Sharing  Affect:  Blunted  Cognitive:  Alert and Appropriate  Insight:  Developing/Improving  Engagement in Therapy:  Engaged  Modes of Intervention:   Education, Support and Processing, Activity  Ambrose Mantle, LCSW 12/09/2014

## 2014-12-10 DIAGNOSIS — F3130 Bipolar disorder, current episode depressed, mild or moderate severity, unspecified: Secondary | ICD-10-CM

## 2014-12-10 DIAGNOSIS — F909 Attention-deficit hyperactivity disorder, unspecified type: Secondary | ICD-10-CM

## 2014-12-10 MED ORDER — IBUPROFEN 600 MG PO TABS
600.0000 mg | ORAL_TABLET | Freq: Four times a day (QID) | ORAL | Status: DC | PRN
  Administered 2014-12-10 – 2014-12-18 (×12): 600 mg via ORAL
  Filled 2014-12-10 (×14): qty 1

## 2014-12-10 MED ORDER — PANTOPRAZOLE SODIUM 20 MG PO TBEC
20.0000 mg | DELAYED_RELEASE_TABLET | Freq: Every day | ORAL | Status: DC
  Administered 2014-12-10 – 2014-12-19 (×10): 20 mg via ORAL
  Filled 2014-12-10 (×11): qty 1
  Filled 2014-12-10: qty 14
  Filled 2014-12-10: qty 1

## 2014-12-10 MED ORDER — QUETIAPINE FUMARATE 50 MG PO TABS
50.0000 mg | ORAL_TABLET | Freq: Every day | ORAL | Status: DC
  Administered 2014-12-10 – 2014-12-15 (×4): 50 mg via ORAL
  Filled 2014-12-10 (×8): qty 1

## 2014-12-10 MED ORDER — CITALOPRAM HYDROBROMIDE 10 MG PO TABS
10.0000 mg | ORAL_TABLET | Freq: Every day | ORAL | Status: DC
  Administered 2014-12-10 – 2014-12-11 (×2): 10 mg via ORAL
  Filled 2014-12-10 (×5): qty 1

## 2014-12-10 MED ORDER — AMPHETAMINE-DEXTROAMPHETAMINE 10 MG PO TABS
30.0000 mg | ORAL_TABLET | Freq: Two times a day (BID) | ORAL | Status: DC
  Administered 2014-12-11 (×2): 30 mg via ORAL
  Filled 2014-12-10 (×2): qty 3

## 2014-12-10 NOTE — Care Management Utilization Note (Signed)
   Per State Regulation 482.30  This chart was reviewed for necessity with respect to the patient's Admission/ Duration of stay.  Next review date: 12/13/14  Ashtian Villacis Morrison RN, BSN 

## 2014-12-10 NOTE — Progress Notes (Signed)
Recreation Therapy Notes  Date: 07.25.16 Time: 9:30 am Location: 300 Hall Group Room  Group Topic: Stress Management  Goal Area(s) Addresses:  Patient will verbalize importance of using healthy stress management.  Patient will identify positive emotions associated with healthy stress management.   Intervention: Stress Management  Activity :  Progressive Muscle Relaxation.  LRT introduced and educated patients on stress management technique of progressive muscle relaxation.  [facility] script was used to deliver the technique to patients.  Patients were asked to follow script read allowed by LRT to engage in practicing the stress management technique.  Education:  Stress Management, Discharge Planning.   Education Outcome: Acknowledges edcuation/In group clarification offered/Needs additional education  Clinical Observations/Feedback: Patient did not attend group.   Mason Meadows, LRT/CTRS         Mason Meadows 12/10/2014 1:24 PM 

## 2014-12-10 NOTE — [facility] Group Notes (Signed)
[facility] LCSW Aftercare Discharge Planning Group Note  12/10/2014 8:45 AM  Pt attended group therapy but stated, "I don't share my stuff in groups." Pt expressed no immediate needs.  Chad Cordial, LCSWA 12/10/2014 1:22 PM

## 2014-12-10 NOTE — Progress Notes (Signed)
[facility] Group Notes:  (Nursing/MHT/Case Management/Adjunct)  Date:  12/10/2014  Time:  11:14 PM  Type of Therapy:  Psychoeducational Skills  Participation Level:  Minimal  Participation Quality:  Appropriate  Affect:  Irritable  Cognitive:  Appropriate  Insight:  Limited  Engagement in Group:  Engaged  Modes of Intervention:  Discussion  Summary of Progress/Problems: Tonight in wrap up group Mason Meadows stated that the situation that occurred earlier in the day with another patient caused his day to be about a 4 he said that being around the other patients though made his day a little better.  [name] 12/10/2014, 11:14 PM

## 2014-12-10 NOTE — Plan of Care (Signed)
Problem: Diagnosis: Increased Risk For Suicide Attempt Goal: STG-Patient Will Comply With Medication Regime Outcome: Progressing Patient is compliant with his medication regimen.

## 2014-12-10 NOTE — Progress Notes (Signed)
D.  Pt pleasant on approach, remains anxious in part due to peer on 300 hall that has been loudly agitated for the entire weekend.  Pt requested to be transferred off 300 hall for this reason and day shift AC did accommodate his request.  Pt continues to endorse passive SI but denies HI/hallucinations at this time.  Positive for evening wrap up group, interacting appropriately with peers on the unit.  A.  Support and encouragement offered, medication given as ordered  R.  Pt remains safe on unit, will continue to monitor.

## 2014-12-10 NOTE — [facility] Group Notes (Signed)
[facility] LCSW Group Therapy  12/10/2014 1:15pm  Type of Therapy:  Group Therapy vercoming Obstacles  Participation Level:  Active  Participation Quality:  Appropriate   Affect:  Appropriate  Cognitive:  Appropriate and Oriented  Insight:  Developing/Improving and Improving  Engagement in Therapy:  Improving  Modes of Intervention:  Discussion, Exploration, Problem-solving and Support  Description of Group:   In this group patients will be encouraged to explore what they see as obstacles to their own wellness and recovery. They will be guided to discuss their thoughts, feelings, and behaviors related to these obstacles. The group will process together ways to cope with barriers, with attention given to specific choices patients can make. Each patient will be challenged to identify changes they are motivated to make in order to overcome their obstacles. This group will be process-oriented, with patients participating in exploration of their own experiences as well as giving and receiving support and challenge from other group members.  Summary of Patient Progress: Pt participated actively in group discussion, identifying his negative thinking as an obstacle. Pt processed with peers and CSW the importance of being aware of your thoughts and feelings in attempts to change negative thinking patterns. Pt was receptive to this. Pt also discussed how obstacles seem to "keep coming" despite how many he overcomes. Pt was able to discuss how moving cities in the past helped him to achieve and maintain sobriety.   Therapeutic Modalities:   Cognitive Behavioral Therapy Solution Focused Therapy Motivational Interviewing Relapse Prevention Therapy   Chad Cordial, LCSWA 12/10/2014 4:31 PM

## 2014-12-10 NOTE — Progress Notes (Signed)
Patient ID: Mason Meadows, male   DOB: [birth year], 39 y.o.   MRN: [id number]  DAR: Pt. Denies HI and A/V Hallucinations. He endorses SI stating, "I feel depressed and feel like I might do something if I discharged today." Patient is able to contract for safety while at [facility]. He reports that his sleep last night was good, appetite is fair, energy level is low, and concentration level is poor. He rates his depression 8/10, hopelessness 9/10, and anxiety 9/10. Patient reports pain in right hip and is receiving scheduled medication for this. Support and encouragement provided to the patient. Scheduled medications administered to patient per physician's orders. Patient received PRN Maalox for heartburn prior to going downstairs for recreation. Patient is receptive and cooperative. He is seen in the milieu attending groups and interacting with his peers appropriately. Q15 minute checks are maintained for safety.

## 2014-12-10 NOTE — Progress Notes (Addendum)
Patient ID: Mason Meadows, male   DOB: 11/11/1975, 39 y.o.   MRN: [id number] [facility] MD Progress Note  12/10/2014 8:54 PM Mason Meadows  MRN:  [id number]   Subjective:  Patient reports some improvement compared to prior but states he remains depressed and in particular anxious about possibly losing his bed at the Shel[facility]/ home he was living in. Also, is hoping to either be transferred to [facility] or to move there as soon as he is discharged- states " that's where my doctors are, that's where I have my services all set up". Ruminates about another patient being rude and subtly threatening- has informed Nursing Staff who have addressed situation. Denies medication side effects at this time.   Objective:  Patient seen and reviewed with treatment team. Patient reports partial improvement compared to admission but remains ruminative and continues to report depression. Denies SI at this time. At present denies medication side effects. Participating in some groups, no behavioral issues or disruptive behaviors on unit. States he is focused on staying on his antiretrovirals at this time " because my CD4 count was very low without them and I almost died ". Follows up for ID management in [place]. We reviewed importance of maintaining focus on sobriety , abstinence as part of treatment goals. Patient reports long history of ADHD for which he has been on Adderall " for years". States Adderall helpful and well tolerated, concerned that current dose is too small to address symptoms completely.    Principal Problem: Bipolar I disorder, most recent episode depressed Diagnosis:   Patient Active Problem List   Diagnosis Date Noted  . Alcohol abuse [F10.10] 12/07/2014  . Bipolar I disorder, most recent episode depressed [F31.30] 12/07/2014  . Mood disorder [F39] 12/06/2014  . Thrush [B37.0]   . 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 Noncompliance [Z91.19]   . Neutropenic fever [D70.9]  08/31/2014  . Fever [R50.9] 08/31/2014  . Chest pain [R07.9] 08/31/2014  . Pancytopenia [D61.818] 08/31/2014  . HIV (human immunodeficiency virus infection) [Z21] 08/31/2014  . Acute respiratory failure with hypoxia [J96.01] 05/31/2013  . Presumed Pneumocystosis pneumonia [B59] 05/31/2013  . Severe protein-calorie malnutrition [E43] 05/31/2013  . AIDS [B20] 05/31/2013   Total Time spent with patient: 20 minutes   Past Medical History:  Past Medical History  Diagnosis Date  . Pneumonia   . Anxiety   . AIDS (acquired immune deficiency syndrome)   . Depression   . Mood disorder 12/06/2014    Past Surgical History  Procedure Laterality Date  . Back surgery    . Total hip arthroplasty     Family History:  Family History  Problem Relation Age of Onset  . Hypertension Mother   . Diabetes Mellitus II Mother   . Hypertension Father   . Diabetes Mellitus II Father    Social History:  History  Alcohol Use No     History  Drug Use No    Comment: last time -2 months ago    History   Social History  . Marital Status: Single    Spouse Name: N/A  . Number of Children: N/A  . Years of Education: N/A   Social History Main Topics  . Smoking status: Current Every Day Smoker -- 1.00 packs/day for 10 years    Types: Cigarettes  . Smokeless tobacco: Never Used  . Alcohol Use: No  . Drug Use: No     Comment: last time -2 months ago  . Sexual Activity: Yes  Comment: Hasn't smoked   Other Topics Concern  . None   Social History Narrative   Additional History:    Sleep: improved   Appetite:  Improved    Assessment:   Musculoskeletal: Strength & Muscle Tone: within normal limits Gait & Station: normal Patient leans: N/A   Psychiatric Specialty Exam: Physical Exam  Nursing note and vitals reviewed. Constitutional: He is oriented to person, place, and time.  Neck: Normal range of motion.  Respiratory: Effort normal.  Musculoskeletal: Normal range of motion.   Neurological: He is alert and oriented to person, place, and time.    Review of Systems  Musculoskeletal: Positive for myalgias, back pain and joint pain.  Skin: Positive for rash.  Psychiatric/Behavioral: Positive for depression, suicidal ideas and substance abuse. The patient is nervous/anxious and has insomnia.   All other systems reviewed and are negative.    Blood pressure 110/80, pulse 103, temperature 97.7 F (36.5 C), temperature source Oral, resp. rate 16, height  (1.753 m), weight 202 lb (91.627 kg).Body mass index is 29.82 kg/(m^2).  General Appearance: Casual and Fairly Groomed  Eye Contact::  Good  Speech:  Clear and Coherent  Volume:  Normal  Mood:  partially improved mood, reports still depressed   Affect:  Appropriate and mildly constricted but reactive   Thought Process:  Circumstantial and Linear  Orientation:  Full (Time, Place, and Person)  Thought Content:  Rumination- denies hallucinations , no delusions, not internally preoccupied at this time   Suicidal Thoughts:  No  Homicidal Thoughts:  No  Memory:  Immediate;   Good Recent;   Good Remote;   Good  Judgement:  Fair  Insight:  Fair  Psychomotor Activity:  Normal  Concentration:  Fair  Recall:  Good  Fund of Knowledge:Good  Language: Good  Akathisia:  No  Handed:  Right  AIMS (if indicated):     Assets:  Communication Skills Desire for Improvement  ADL's:  Intact  Cognition: WNL  Sleep:  Number of Hours: 6.5     Current Medications: Current Facility-Administered Medications  Medication Dose Route Frequency Provider Last Rate Last Dose  . acetaminophen (TYLENOL) tablet 650 mg  650 mg Oral Q4H PRN Earney Navy, NP   650 mg at 12/10/14 2034  . alum & mag hydroxide-simeth (MAALOX/MYLANTA) 200-200-20 MG/5ML suspension 30 mL  30 mL Oral PRN Earney Navy, NP   30 mL at 12/10/14 1509  . [START ON 12/11/2014] amphetamine-dextroamphetamine (ADDERALL) tablet 30 mg  30 mg Oral BID WC Rachael Fee, MD      . citalopram (CELEXA) tablet 10 mg  10 mg Oral Daily Craige Cotta, MD   10 mg at 12/10/14 1723  . clindamycin (CLEOCIN) capsule 300 mg  300 mg Oral 3 times per day Shuvon B Rankin, NP   300 mg at 12/10/14 1439  . clonazePAM (KLONOPIN) tablet 0.5 mg  0.5 mg Oral TID PRN Shuvon B Rankin, NP   0.5 mg at 12/10/14 1806  . diclofenac sodium (VOLTAREN) 1 % transdermal gel 2 g  2 g Topical QID Shuvon B Rankin, NP   2 g at 12/10/14 1156  . dolutegravir (TIVICAY) tablet 50 mg  50 mg Oral Daily Earney Navy, NP   50 mg at 12/10/14 0802  . emtricitabine-tenofovir (TRUVADA) 200-300 MG per tablet 1 tablet  1 tablet Oral Daily Earney Navy, NP   1 tablet at 12/10/14 0801  . gabapentin (NEURONTIN) capsule 1,200 mg  1,200 mg Oral TID Earney Navy, NP   1,200 mg at 12/10/14 1723  . nicotine (NICODERM CQ - dosed in mg/24 hours) patch 21 mg  21 mg Transdermal Daily Earney Navy, NP   21 mg at 12/10/14 0552  . ondansetron (ZOFRAN) tablet 4 mg  4 mg Oral Q8H PRN Earney Navy, NP      . pantoprazole (PROTONIX) EC tablet 20 mg  20 mg Oral Daily Craige Cotta, MD   20 mg at 12/10/14 1723  . QUEtiapine (SEROQUEL) tablet 50 mg  50 mg Oral QHS Craige Cotta, MD      . traZODone (DESYREL) tablet 200 mg  200 mg Oral QHS PRN Rachael Fee, MD   200 mg at 12/09/14 2100    Lab Results:  Results for orders placed or performed during the hospital encounter of 12/06/14 (from the past 48 hour(s))  Urinalysis, Routine w reflex microscopic (not at [facility])     Status: Abnormal   Collection Time: 12/09/14  3:40 PM  Result Value Ref Range   Color, Urine YELLOW YELLOW   APPearance CLEAR CLEAR   Specific Gravity, Urine 1.004 (L) 1.005 - 1.030   pH 6.0 5.0 - 8.0   Glucose, UA NEGATIVE NEGATIVE mg/dL   Hgb urine dipstick NEGATIVE NEGATIVE   Bilirubin Urine NEGATIVE NEGATIVE   Ketones, ur NEGATIVE NEGATIVE mg/dL   Protein, ur NEGATIVE NEGATIVE mg/dL   Urobilinogen, UA 0.2 0.0 -  1.0 mg/dL   Nitrite NEGATIVE NEGATIVE   Leukocytes, UA NEGATIVE NEGATIVE    Comment: MICROSCOPIC NOT DONE ON URINES WITH NEGATIVE PROTEIN, BLOOD, LEUKOCYTES, NITRITE, OR GLUCOSE <1000 mg/dL. Performed at [facility]     Physical Findings: AIMS: Facial and Oral Movements Muscles of Facial Expression: None, normal Lips and Perioral Area: None, normal Jaw: None, normal Tongue: None, normal,Extremity Movements Upper (arms, wrists, hands, fingers): None, normal Lower (legs, knees, ankles, toes): None, normal, Trunk Movements Neck, shoulders, hips: None, normal, Overall Severity Severity of abnormal movements (highest score from questions above): None, normal Incapacitation due to abnormal movements: None, normal Patient's awareness of abnormal movements (rate only patient's report): No Awareness, Dental Status Current problems with teeth and/or dentures?: No Does patient usually wear dentures?: No  CIWA:    COWS:      Assessment- patient partially improved - remains anxious/depressed. No SI, no current psychotic symptoms, tolerating medications well. Focused on returning to [facility] area and avoiding losing his bed at [facility] where he had been residing prior.   Treatment Plan Summary: Daily contact with patient to assess and evaluate symptoms and progress in treatment and Medication management  Daily contact with patient to assess and evaluate symptoms and progress in treatment and Medication management Supportive approach/coping skills Continue Adderall 30 mgrs  BID to address ADHD  D/C Wellbutrin XL- due to concerns of interactions with Adderall, contributing to anxiety. Start Celexa 10 mgrs QDAY to address depression , anxiety Continue Seroquel  At 50 mgrs QHS  for anxiety and insomnia  Pain/anxiety; will continue the Neurontin 1200 mg TID- denies side effects and states this hight dose has helped  Continue Klonopin 0.5 mgrs TID PRN Anxiety Continue  antiretroviral medications to address HIV     Continue with current treatment plan.    Medical Decision Making:  Review of Psycho-Social Stressors (1), New Problem, with no additional work-up planned (3), Review of Last Therapy Session (1), Review of Medication Regimen & Side Effects (2) and  Review of New Medication or Change in Dosage (2)   Nehemiah Massed,  MD  12/10/2014, 8:54 PM

## 2014-12-11 MED ORDER — SUCRALFATE 1 G PO TABS
1.0000 g | ORAL_TABLET | Freq: Three times a day (TID) | ORAL | Status: DC
  Administered 2014-12-11 – 2014-12-19 (×27): 1 g via ORAL
  Filled 2014-12-11 (×2): qty 1
  Filled 2014-12-11: qty 56
  Filled 2014-12-11 (×14): qty 1
  Filled 2014-12-11 (×2): qty 56
  Filled 2014-12-11: qty 1
  Filled 2014-12-11: qty 56
  Filled 2014-12-11 (×20): qty 1

## 2014-12-11 MED ORDER — AMPHETAMINE-DEXTROAMPHET ER 10 MG PO CP24
30.0000 mg | ORAL_CAPSULE | ORAL | Status: DC
  Administered 2014-12-12: 30 mg via ORAL
  Filled 2014-12-11: qty 3

## 2014-12-11 MED ORDER — CITALOPRAM HYDROBROMIDE 20 MG PO TABS
20.0000 mg | ORAL_TABLET | Freq: Every day | ORAL | Status: DC
  Administered 2014-12-12 – 2014-12-14 (×3): 20 mg via ORAL
  Filled 2014-12-11 (×5): qty 1

## 2014-12-11 NOTE — Progress Notes (Signed)
D. Pt had been up and active in milieu, did attend and participate in evening group activity. Pt spoke of his day and spoke about how he was having conflict with another pt on another hall during the day but the situation has been remedied this evening. Pt main complaint this evening was pain caused by what he says is a crack in his tooth. Pt rated pain as a 10 and did request and receive medications to assist with pain this evening. A. Support and encouragement provided. R. Safety maintained, will continue to monitor.

## 2014-12-11 NOTE — Progress Notes (Signed)
Pt attended spiritual care group on grief and loss facilitated by chaplain Burnis Kingfisher. Group opened with brief discussion and psycho-social ed around grief and loss in relationships and in relation to self - identifying life patterns, circumstances, changes that cause losses. Established group norm of speaking from own life experience. Group goal of establishing open and affirming space for members to share loss and experience with grief, normalize grief experience and provide psycho social education and grief support.  Group drew on narrative and Adlerian therapeutic modalities.    Mason Meadows) was present throughout group.  Attentive and engaged.  Spoke with other group members about grief process, especially feelings of surrealness, elements that complicate moving through grief (substance use), and coping skills he relies on - writing.    Belva Crome MDiv

## 2014-12-11 NOTE — Progress Notes (Signed)
[facility] Group Notes:  (Nursing/MHT/Case Management/Adjunct)  Date:  12/11/2014  Time:  9:19 PM  Type of Therapy:  Psychoeducational Skills  Participation Level:  Active  Participation Quality:  Appropriate  Affect:  Appropriate  Cognitive:  Appropriate  Insight:  Good  Engagement in Group:  Engaged  Modes of Intervention:  Discussion  Summary of Progress/Problems: Tonight in wrap up group Mason Meadows said his day was a 3, he found out some bad news that affected his day but being around the other patients helped his day level out.  [name] 12/11/2014, 9:19 PM

## 2014-12-11 NOTE — Progress Notes (Signed)
Recreation Therapy Notes  Animal-Assisted Activity (AAA) Program Checklist/Progress Notes Patient Eligibility Criteria Checklist & Daily Group note for Rec Tx Intervention  Date: 07.26.16 Time: 2:45 pm Location: [address]  AAA/T Program Assumption of Risk Form signed by Patient/ or Parent Legal Guardian yes  Patient is free of allergies or sever asthma yes  Patient reports no fear of animals yes  Patient reports no history of cruelty to animalsyes  Patient understands his/her participation is voluntary yes  Patient washes hands before animal contact yes  Patient washes hands after animal contact yes  Behavioral Response: Attentive  Education: Hand Washing, Appropriate Animal Interaction   Education Outcome: Acknowledges understanding/In group clarification offered/Needs additional education.   Clinical Observations/Feedback:  Patient attended group.   Caroll Rancher, LRT/CTRS         Caroll Rancher A 12/11/2014 3:57 PM

## 2014-12-11 NOTE — Plan of Care (Signed)
Problem: Diagnosis: Increased Risk For Suicide Attempt Goal: STG-Patient Will Report Suicidal Feelings to Staff Outcome: Progressing Patient able to report suicidal feelings, is able to contract for safety.

## 2014-12-11 NOTE — [facility] Group Notes (Signed)
[facility] LCSW Group Therapy 12/11/2014 1:15 PM  Type of Therapy: Group Therapy- Feelings about Diagnosis  Participation Level: Active   Participation Quality:  Appropriate but Monopolizing at times  Affect:  Appropriate  Cognitive: Alert and Oriented   Insight:  Developing   Engagement in Therapy: Developing/Improving and Engaged   Modes of Intervention: Clarification, Confrontation, Discussion, Education, Exploration, Limit-setting, Orientation, Problem-solving, Rapport Building, Dance movement psychotherapist, Socialization and Support  Description of Group:   This group will allow patients to explore their thoughts and feelings about diagnoses they have received. Patients will be guided to explore their level of understanding and acceptance of these diagnoses. Facilitator will encourage patients to process their thoughts and feelings about the reactions of others to their diagnosis, and will guide patients in identifying ways to discuss their diagnosis with significant others in their lives. This group will be process-oriented, with patients participating in exploration of their own experiences as well as giving and receiving support and challenge from other group members.   Therapeutic Modalities:   Cognitive Behavioral Therapy Solution Focused Therapy Motivational Interviewing Relapse Prevention Therapy  Chad Cordial, LCSWA 12/11/2014 5:11 PM

## 2014-12-11 NOTE — [facility] Group Notes (Signed)
[facility] Group Notes:  (Nursing/MHT/Case Management/Adjunct)  Date:  12/11/2014  Time:  10:14 AM  Type of Therapy:  Nurse Education  Participation Level:  Active  Participation Quality:  Redirectable  Affect:  Anxious and Depressed  Cognitive:  Appropriate  Insight:  Lacking  Engagement in Group:  Engaged and Off Topic  Modes of Intervention:  Activity, Discussion and Education  Summary of Progress/Problems: The purpose of this group is to discuss the topic of the day which is Recovery. Patients did a Recovery activity and patient's were encouraged to fill it out. Patient was seen looking at the activity sheet and engaged in group however did not fill out his activity sheet. Patient was somewhat preoccupied with seeing the MD. Patient would make comments like, "I better not have to wait until 5" about the time he sees the MD. Patient is hyper focused on his Adderall at this time.  Dawaun Brancato E 12/11/2014, 10:14 AM

## 2014-12-11 NOTE — Progress Notes (Signed)
Patient ID: Mason Meadows, male   DOB: [birth year], 39 y.o.   MRN: [id number] [facility] MD Progress Note  12/11/2014 6:04 PM Mason Meadows  MRN:  [id number]   Subjective:  Patient reports ongoing depression, anxiety, and sense of difficulty concentrating , distractibility, " can't get my thoughts organized". At this time he attributes these symptoms more to ADHD than to Bipolar disorder/ Mood disorder . He tends to focus on Adderall- states he was on much higher dose in the past ( 60 mgrs BID ?) and wants to increase dose . We reviewed concerns  About higher doses and increased risk of side effects. Part of his concern is that  He feels Adderall tends to " give out" after a few hours of taking it, leading to increased symptoms several hours after taking last dose . We discussed option of switching to Adderall XR with extended half life to address this and he expressed interest . Also ruminative about finding out that he lost his bed at [facility] he had been staying in.  Denies medication  Side effects.   Objective:  Patient seen and reviewed with treatment team. Visible on unit, going to groups. Some mild irritability and focused on medication issues, mainly Adderall, which he states has been very helpful to address distractibility and " racing thoughts " ( which he describes as related to ADHD more than to bipolar mood disorder at this time). More responsive to review of concerns about side effects. Denies medication side effects. Reports depression , anxiety, which he attributes at least significantly to housing concerns and learning that " not only did I lose my bed there , but that they are also going to throw my stuff out ". No disruptive behaviors on unit. No agitation .    Principal Problem: Bipolar I disorder, most recent episode depressed Diagnosis:   Patient Active Problem List   Diagnosis Date Noted  . Alcohol abuse [F10.10] 12/07/2014  . Bipolar I disorder, most recent  episode depressed [F31.30] 12/07/2014  . Mood disorder [F39] 12/06/2014  . Thrush [B37.0]   . 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 Noncompliance [Z91.19]   . Neutropenic fever [D70.9] 08/31/2014  . Fever [R50.9] 08/31/2014  . Chest pain [R07.9] 08/31/2014  . Pancytopenia [D61.818] 08/31/2014  . HIV (human immunodeficiency virus infection) [Z21] 08/31/2014  . Acute respiratory failure with hypoxia [J96.01] 05/31/2013  . Presumed Pneumocystosis pneumonia [B59] 05/31/2013  . Severe protein-calorie malnutrition [E43] 05/31/2013  . AIDS [B20] 05/31/2013   Total Time spent with patient: 20 minutes   Past Medical History:  Past Medical History  Diagnosis Date  . Pneumonia   . Anxiety   . AIDS (acquired immune deficiency syndrome)   . Depression   . Mood disorder 12/06/2014    Past Surgical History  Procedure Laterality Date  . Back surgery    . Total hip arthroplasty     Family History:  Family History  Problem Relation Age of Onset  . Hypertension Mother   . Diabetes Mellitus II Mother   . Hypertension Father   . Diabetes Mellitus II Father    Social History:  History  Alcohol Use No     History  Drug Use No    Comment: last time -2 months ago    History   Social History  . Marital Status: Single    Spouse Name: N/A  . Number of Children: N/A  . Years of Education: N/A   Social History Main Topics  .  Smoking status: Current Every Day Smoker -- 1.00 packs/day for 10 years    Types: Cigarettes  . Smokeless tobacco: Never Used  . Alcohol Use: No  . Drug Use: No     Comment: last time -2 months ago  . Sexual Activity: Yes     Comment: Hasn't smoked   Other Topics Concern  . None   Social History Narrative   Additional History:    Sleep: improved   Appetite:  Improved    Assessment:   Musculoskeletal: Strength & Muscle Tone: within normal limits Gait & Station: normal Patient leans: N/A   Psychiatric Specialty Exam: Physical Exam   Nursing note and vitals reviewed. Constitutional: He is oriented to person, place, and time.  Neck: Normal range of motion.  Respiratory: Effort normal.  Musculoskeletal: Normal range of motion.  Neurological: He is alert and oriented to person, place, and time.    Review of Systems  Musculoskeletal: Positive for myalgias, back pain and joint pain.  Skin: Positive for rash.  Psychiatric/Behavioral: Positive for depression, suicidal ideas and substance abuse. The patient is nervous/anxious and has insomnia.   All other systems reviewed and are negative.  reports some ongoing " heart burn ", denies vomiting , denies melenas .  Blood pressure 123/90, pulse 104, temperature 98.6 F (37 C), temperature source Oral, resp. rate 16, height  (1.753 m), weight 202 lb (91.627 kg).Body mass index is 29.82 kg/(m^2).  General Appearance: Fairly Groomed  Patent attorney::  Good  Speech:  Clear and Coherent  Volume:  Normal  Mood:   Depression partially improved  Affect:   Constricted, somewhat irritable, and anxious.  Thought Process:  Circumstantial and Linear  Orientation:  Full (Time, Place, and Person)  Thought Content:  Rumination- denies hallucinations , no delusions, not internally preoccupied at this time - ruminative about being homeless at this time , having lost bed he had at shelter in [place].   Suicidal Thoughts:  No- denies any current SI or self injurious ideations.  Homicidal Thoughts:  No  Memory:  Immediate;   Good Recent;   Good Remote;   Good  Judgement:  Fair  Insight:  Fair  Psychomotor Activity:  Normal  Concentration:  Fair  Recall:  Good  Fund of Knowledge:Good  Language: Good  Akathisia:  No  Handed:  Right  AIMS (if indicated):     Assets:  Communication Skills Desire for Improvement  ADL's:  Intact  Cognition: WNL  Sleep:  Number of Hours: 6.5     Current Medications: Current Facility-Administered Medications  Medication Dose Route Frequency Provider  Last Rate Last Dose  . acetaminophen (TYLENOL) tablet 650 mg  650 mg Oral Q4H PRN Earney Navy, NP   650 mg at 12/10/14 2034  . alum & mag hydroxide-simeth (MAALOX/MYLANTA) 200-200-20 MG/5ML suspension 30 mL  30 mL Oral PRN Earney Navy, NP   30 mL at 12/11/14 1445  . [START ON 12/12/2014] amphetamine-dextroamphetamine (ADDERALL XR) 24 hr capsule 30 mg  30 mg Oral BH-q7a Craige Cotta, MD      . Melene Muller ON 12/12/2014] citalopram (CELEXA) tablet 20 mg  20 mg Oral Daily Rockey Situ Cobos, MD      . clindamycin (CLEOCIN) capsule 300 mg  300 mg Oral 3 times per day Shuvon B Rankin, NP   300 mg at 12/11/14 1429  . clonazePAM (KLONOPIN) tablet 0.5 mg  0.5 mg Oral TID PRN Shuvon B Rankin, NP   0.5 mg at  12/11/14 1430  . diclofenac sodium (VOLTAREN) 1 % transdermal gel 2 g  2 g Topical QID Shuvon B Rankin, NP   2 g at 12/10/14 2127  . dolutegravir (TIVICAY) tablet 50 mg  50 mg Oral Daily Earney Navy, NP   50 mg at 12/11/14 1610  . emtricitabine-tenofovir (TRUVADA) 200-300 MG per tablet 1 tablet  1 tablet Oral Daily Earney Navy, NP   1 tablet at 12/11/14 [phone]  . gabapentin (NEURONTIN) capsule 1,200 mg  1,200 mg Oral TID Earney Navy, NP   1,200 mg at 12/11/14 1658  . ibuprofen (ADVIL,MOTRIN) tablet 600 mg  600 mg Oral Q6H PRN Evanna Burkett, NP   600 mg at 12/11/14 1430  . nicotine (NICODERM CQ - dosed in mg/24 hours) patch 21 mg  21 mg Transdermal Daily Earney Navy, NP   21 mg at 12/11/14 0641  . ondansetron (ZOFRAN) tablet 4 mg  4 mg Oral Q8H PRN Earney Navy, NP      . pantoprazole (PROTONIX) EC tablet 20 mg  20 mg Oral Daily Craige Cotta, MD   20 mg at 12/11/14 0749  . QUEtiapine (SEROQUEL) tablet 50 mg  50 mg Oral QHS Craige Cotta, MD   50 mg at 12/10/14 2123  . sucralfate (CARAFATE) tablet 1 g  1 g Oral TID WC & HS Craige Cotta, MD   1 g at 12/11/14 1658  . traZODone (DESYREL) tablet 200 mg  200 mg Oral QHS PRN Rachael Fee, MD   200 mg at  12/10/14 2125    Lab Results:  No results found for this or any previous visit (from the past 48 hour(s)).  Physical Findings: AIMS: Facial and Oral Movements Muscles of Facial Expression: None, normal Lips and Perioral Area: None, normal Jaw: None, normal Tongue: None, normal,Extremity Movements Upper (arms, wrists, hands, fingers): None, normal Lower (legs, knees, ankles, toes): None, normal, Trunk Movements Neck, shoulders, hips: None, normal, Overall Severity Severity of abnormal movements (highest score from questions above): None, normal Incapacitation due to abnormal movements: None, normal Patient's awareness of abnormal movements (rate only patient's report): No Awareness, Dental Status Current problems with teeth and/or dentures?: No Does patient usually wear dentures?: No  CIWA:    COWS:      Assessment- today depressed and anxious, particularly regarding disposition planning, and finding out he has lost bed at shelter he had been living at . Does respond partially to support, encouragement , empathy, and states he " knows that there are other places I can go to". He describes chronic ADHD type symptoms, independent from mood disorder, and states he was on Adderall for a long time with good response and no side effects. He reports being on high dose. As Adderall " wears out too quickly", he is agreeing to switching from regular Adderall to Adderall XR.  Denies SI at this time. Remains depressed, ruminative .   Treatment Plan Summary: Daily contact with patient to assess and evaluate symptoms and progress in treatment and Medication management  Daily contact with patient to assess and evaluate symptoms and progress in treatment and Medication management Supportive approach/coping skills D/C Adderall. Start Adderall XR 30 mgrs QAM to address ADHD related symptoms with longer acting medication. Increase  Celexa  To 20 mgrs QDAY to address depression , anxiety Continue  Seroquel  50 mgrs QHS  for anxiety and insomnia  Continue Neurontin 1200 mg TID for anxiety and pain Continue Klonopin 0.5  mgrs TID PRN Anxiety Continue antiretroviral medications to address HIV  Continue Protonix and add Carafate to address upper GI symptoms/ GERD symptoms.  SW to help patient look at other disposition options.   Continue with current treatment plan.    Medical Decision Making:  Review of Psycho-Social Stressors (1), New Problem, with no additional work-up planned (3), Review of Last Therapy Session (1), Review of Medication Regimen & Side Effects (2) and Review of New Medication or Change in Dosage (2)   Nehemiah Massed,  MD  12/11/2014, 6:04 PM

## 2014-12-11 NOTE — Progress Notes (Signed)
Patient ID: Mason Meadows, male   DOB: January 08, 1976, 39 y.o.   MRN: [id number]  DAR: Pt. Denies HI and A/V Hallucinations. He continues to endorse SI but is able to contract for safety. He reports that he slept well last night, appetite is fair, energy level is low, and concentration is poor. He rates his depression and anxiety 9/10, and his hopelessness 7/10. No self harm behaviors observed or reported. He does continue to have tooth and hip pain and is receiving PRN Ibuprofen for this however he reports no to little relief. Support and encouragement provided to the patient. Scheduled medications administered to patient per physician's orders. He reported anxiety throughout the day and received PRN Klonopin for this. Patient is receptive and cooperative. He does continue to ruminate on his Adderall but was less focused on it after speaking with the MD. He reports that he is still having heartburn and received PRN Maalox for this. He is seen in the milieu interacting with peers and is attending groups. Q15 minute checks are maintained for safety.

## 2014-12-11 NOTE — Tx Team (Signed)
Interdisciplinary Treatment Plan Update (Adult) Date: 12/11/2014    Time Reviewed: 9:30 AM  Progress in Treatment: Attending groups: Yes Participating in groups: Yes Taking medication as prescribed: Yes Tolerating medication: Yes Family/Significant other contact made: No, Pt declines family contact Patient understands diagnosis: Yes Discussing patient identified problems/goals with staff: Yes Medical problems stabilized or resolved: Yes Denies suicidal/homicidal ideation: Yes Issues/concerns per patient self-inventory: Yes Other:  New problem(s) identified: N/A  Discharge Plan or Barriers: 12/07/2014:  CSW continuing to assess, patient new to milieu. 12/11/2014: Continuing to assess. Pt wants to return to the [facility] area.  Reason for Continuation of Hospitalization:  Depression Anxiety Medication Stabilization   Comments: N/A  Estimated length of stay: 3-5 days   Patient is a 40 year old male admitted for depression and anxiety following his release from jail. Patient was living in a recovery house in [place] prior to being arrested. Patient will benefit from crisis stabilization, medication evaluation, group therapy, and psycho education in addition to case management for discharge planning. Patient and CSW reviewed pt's identified goals and treatment plan. Pt verbalized understanding and agreed to treatment plan.     Review of initial/current patient goals per problem list:  1. Goal(s): Patient will participate in aftercare plan   Met: No   Target date: 3-5 days   As evidenced by: Patient will participate within aftercare plan AEB aftercare provider and housing plan at discharge being identified.  7/22: Goal not met: CSW assessing for appropriate referrals for pt and will have follow up secured prior to d/c. 7/26: Pt continues to be unsure about his discharge plan. If he is unable to return to the [facility] in [place], [place] will also need a new  provider.   2. Goal (s): Patient will exhibit decreased depressive symptoms and suicidal ideations.   Met: No   Target date: 3-5 days   As evidenced by: Patient will utilize self rating of depression at 3 or below and demonstrate decreased signs of depression or be deemed stable for discharge by MD.  7/22: Goal not met: Pt presents with flat affect and depressed mood.  Pt admitted with depression rating of 10.  Pt to show decreased sign of depression and a rating of 3 or less before d/c.   7/26: Pt continues to rate depression at 6/10, however presents with bright affect around peers. Pt also endorses passive SI.  3. Goal(s): Patient will demonstrate decreased signs and symptoms of anxiety.   Met: No   Target date: 3-5 days   As evidenced by: Patient will utilize self rating of anxiety at 3 or below and demonstrated decreased signs of anxiety, or be deemed stable for discharge by MD  7/22: Goal not met: Pt presents with anxious mood and affect.  Pt admitted with anxiety rating of 10.  Pt to show decreased sign of anxiety and a rating of 3 or less before d/c. 7/26: Pt continues to rate anxiety highly, approximately 8/10.    4. Goal(s): Patient will demonstrate decreased signs of withdrawal due to substance abuse   Met: No   Target date: 3-5 day   As evidenced by: Patient will produce a CIWA/COWS score of 0, have stable vitals signs, and no symptoms of withdrawal  7/22: Goal met: No withdrawal symptoms reported at this time per medical chart.     Attendees: Patient:    Family:    Physician: Dr. Parke Poisson; Dr. Sabra Heck 12/11/2014 9:30 AM  Nursing: Grayland Ormond, RN;  Gaylan Gerold, RN 12/11/2014 9:30 AM  Clinical Social Worker: Erasmo Downer Drinkard,  [place] 12/11/2014 9:30 AM  Other: Peri Maris, LCSWA  12/11/2014 9:30 AM  Other: Lucinda Dell, Beverly Sessions Liaison 12/11/2014 9:30 AM  Other:  12/11/2014 9:30 AM  Other: 12/11/2014 9:30 AM  Other:    Other:    Other:    Other:       Scribe for Treatment Team:  Tilden Fossa, MSW, [employer] [phone]

## 2014-12-12 DIAGNOSIS — R45851 Suicidal ideations: Secondary | ICD-10-CM

## 2014-12-12 MED ORDER — AMPHETAMINE-DEXTROAMPHETAMINE 10 MG PO TABS
30.0000 mg | ORAL_TABLET | Freq: Two times a day (BID) | ORAL | Status: DC
  Administered 2014-12-13: 30 mg via ORAL
  Filled 2014-12-12: qty 3

## 2014-12-12 NOTE — Plan of Care (Signed)
Problem: Diagnosis: Increased Risk For Suicide Attempt Goal: STG-Patient will be able to identify reason for suicidal STG-Patient will be able to identify reason for suicidal ideation  Outcome: Progressing Patient reports he continues to have thoughts of SI. Patient is able to contract for safety, he states that he is depressed about his living situation due to the fact that he called where he was staying PTA and he lost his bed.

## 2014-12-12 NOTE — Progress Notes (Signed)
Patient ID: Mason Meadows, male   DOB: 1975-05-26, 39 y.o.   MRN: [id number]  Patient laying in bed this morning during medication pass. When writer encouraged patient to get out of bed and take medications he stated, "I don't want my medications right now." MD Cobos notified of this.

## 2014-12-12 NOTE — Progress Notes (Signed)
Patient ID: Mason Meadows, male   DOB: 10-Mar-1976, 39 y.o.   MRN: [id number] [facility] MD Progress Note  12/12/2014 8:12 PM Mason Meadows  MRN:  [id number]   Subjective:  Patient reports poor response to Adderall XR- states regular Adderall was working better and wants to change back to latter. Reports tooth pain- chronic. Continues to ruminate about loss of bed at shelter he had been staying in.  Does state that epigastric discomfort he had been experiencing has resolved. Denies medication side effects.   Objective:  Patient seen and reviewed with treatment team. Tends to be somewhat irritable and focused on negative issues /ruminative. He is , however, more reactive in affect, and as session progressed he presented less irritable and with a significantly fuller range of affect. Major stressor continues to be homelessness, loss of bed at his [facility] shelter.  Reports ongoing suicidal ruminations, passive SI, but no plan or intention of hurting self and able to contract for safety on unit. Has not presented with any self injurious ideations on unit .  Continues to focus on medication issues, as noted, feels regular Adderall more effective than XR. Also , requesting opiate for toothache . We reviewed rationale to avoid opiates- patient does not appear to be in any acute distress or discomfort , is on NSAID PRNS as needed for pain.  Denies medication side effects. More responsive to support, empathy, and as noted, although initially irritable, affect improves significantly during session No disruptive behaviors on unit. No agitation .    Principal Problem: Bipolar I disorder, most recent episode depressed Diagnosis:   Patient Active Problem List   Diagnosis Date Noted  . Alcohol abuse [F10.10] 12/07/2014  . Bipolar I disorder, most recent episode depressed [F31.30] 12/07/2014  . Mood disorder [F39] 12/06/2014  . Thrush [B37.0]   . 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  Noncompliance [Z91.19]   . Neutropenic fever [D70.9] 08/31/2014  . Fever [R50.9] 08/31/2014  . Chest pain [R07.9] 08/31/2014  . Pancytopenia [D61.818] 08/31/2014  . HIV (human immunodeficiency virus infection) [Z21] 08/31/2014  . Acute respiratory failure with hypoxia [J96.01] 05/31/2013  . Presumed Pneumocystosis pneumonia [B59] 05/31/2013  . Severe protein-calorie malnutrition [E43] 05/31/2013  . AIDS [B20] 05/31/2013   Total Time spent with patient: 20 minutes   Past Medical History:  Past Medical History  Diagnosis Date  . Pneumonia   . Anxiety   . AIDS (acquired immune deficiency syndrome)   . Depression   . Mood disorder 12/06/2014    Past Surgical History  Procedure Laterality Date  . Back surgery    . Total hip arthroplasty     Family History:  Family History  Problem Relation Age of Onset  . Hypertension Mother   . Diabetes Mellitus II Mother   . Hypertension Father   . Diabetes Mellitus II Father    Social History:  History  Alcohol Use No     History  Drug Use No    Comment: last time -2 months ago    History   Social History  . Marital Status: Single    Spouse Name: N/A  . Number of Children: N/A  . Years of Education: N/A   Social History Main Topics  . Smoking status: Current Every Day Smoker -- 1.00 packs/day for 10 years    Types: Cigarettes  . Smokeless tobacco: Never Used  . Alcohol Use: No  . Drug Use: No     Comment: last time -2 months  ago  . Sexual Activity: Yes     Comment: Hasn't smoked   Other Topics Concern  . None   Social History Narrative   Additional History:    Sleep: improved   Appetite:  Improved    Assessment:   Musculoskeletal: Strength & Muscle Tone: within normal limits Gait & Station: normal Patient leans: N/A   Psychiatric Specialty Exam: Physical Exam  Nursing note and vitals reviewed. Constitutional: He is oriented to person, place, and time.  Neck: Normal range of motion.  Respiratory:  Effort normal.  Musculoskeletal: Normal range of motion.  Neurological: He is alert and oriented to person, place, and time.    Review of Systems  Musculoskeletal: Positive for myalgias, back pain and joint pain.  Skin: Positive for rash.  Psychiatric/Behavioral: Positive for depression, suicidal ideas and substance abuse. The patient is nervous/anxious and has insomnia.   All other systems reviewed and are negative.  reports some ongoing " heart burn ", denies vomiting , denies melenas .  Blood pressure 120/88, pulse 81, temperature 97.9 F (36.6 C), temperature source Oral, resp. rate 16, height 5\' 9"  (1.753 m), weight 202 lb (91.627 kg).Body mass index is 29.82 kg/(m^2).  General Appearance:  Improved grooming   Eye Contact::  Good  Speech:  Clear and Coherent  Volume:  Normal  Mood:    Describes ongoing depression  Affect:   Slightly irritable but more reactive   Thought Process:  Circumstantial and Linear  Orientation:  Full (Time, Place, and Person)  Thought Content:  Rumination- denies hallucinations , no delusions, not internally preoccupied at this time - ruminative about being homeless at this time  Suicidal Thoughts:  Yes.  without intent/plan- describes passive SI, states would not feel safe for discharge at this time, but denies any current plan or intention of hurting self or of SI, and contracts for safety on the unit.   Homicidal Thoughts:  No  Memory:  Immediate;   Good Recent;   Good Remote;   Good  Judgement:  Fair  Insight:  Fair  Psychomotor Activity:  Normal  Concentration:  Fair  Recall:  Good  Fund of Knowledge:Good  Language: Good  Akathisia:  No  Handed:  Right  AIMS (if indicated):     Assets:  Communication Skills Desire for Improvement  ADL's:  Intact  Cognition: WNL  Sleep:  Number of Hours: 1     Current Medications: Current Facility-Administered Medications  Medication Dose Route Frequency Provider Last Rate Last Dose  . acetaminophen  (TYLENOL) tablet 650 mg  650 mg Oral Q4H PRN Earney Navy, NP   650 mg at 12/10/14 2034  . alum & mag hydroxide-simeth (MAALOX/MYLANTA) 200-200-20 MG/5ML suspension 30 mL  30 mL Oral PRN Earney Navy, NP   30 mL at 12/11/14 1445  . [START ON 12/13/2014] amphetamine-dextroamphetamine (ADDERALL) tablet 30 mg  30 mg Oral BID WC Bryanne Riquelme A Briley Bumgarner, MD      . citalopram (CELEXA) tablet 20 mg  20 mg Oral Daily Craige Cotta, MD   20 mg at 12/12/14 0941  . clindamycin (CLEOCIN) capsule 300 mg  300 mg Oral 3 times per day Shuvon B Rankin, NP   300 mg at 12/12/14 1342  . clonazePAM (KLONOPIN) tablet 0.5 mg  0.5 mg Oral TID PRN Shuvon B Rankin, NP   0.5 mg at 12/12/14 1549  . diclofenac sodium (VOLTAREN) 1 % transdermal gel 2 g  2 g Topical QID Shuvon B Rankin, NP  2 g at 12/10/14 2127  . dolutegravir (TIVICAY) tablet 50 mg  50 mg Oral Daily Earney Navy, NP   50 mg at 12/12/14 0941  . emtricitabine-tenofovir (TRUVADA) 200-300 MG per tablet 1 tablet  1 tablet Oral Daily Earney Navy, NP   1 tablet at 12/12/14 0941  . gabapentin (NEURONTIN) capsule 1,200 mg  1,200 mg Oral TID Earney Navy, NP   1,200 mg at 12/12/14 1712  . ibuprofen (ADVIL,MOTRIN) tablet 600 mg  600 mg Oral Q6H PRN Kizzie Fantasia, NP   600 mg at 12/12/14 1549  . nicotine (NICODERM CQ - dosed in mg/24 hours) patch 21 mg  21 mg Transdermal Daily Earney Navy, NP   21 mg at 12/12/14 0942  . ondansetron (ZOFRAN) tablet 4 mg  4 mg Oral Q8H PRN Earney Navy, NP      . pantoprazole (PROTONIX) EC tablet 20 mg  20 mg Oral Daily Craige Cotta, MD   20 mg at 12/12/14 0941  . QUEtiapine (SEROQUEL) tablet 50 mg  50 mg Oral QHS Craige Cotta, MD   50 mg at 12/11/14 2107  . sucralfate (CARAFATE) tablet 1 g  1 g Oral TID WC & HS Craige Cotta, MD   1 g at 12/12/14 1712  . traZODone (DESYREL) tablet 200 mg  200 mg Oral QHS PRN Rachael Fee, MD   200 mg at 12/11/14 2107    Lab Results:  No results found  for this or any previous visit (from the past 48 hour(s)).  Physical Findings: AIMS: Facial and Oral Movements Muscles of Facial Expression: None, normal Lips and Perioral Area: None, normal Jaw: None, normal Tongue: None, normal,Extremity Movements Upper (arms, wrists, hands, fingers): None, normal Lower (legs, knees, ankles, toes): None, normal, Trunk Movements Neck, shoulders, hips: None, normal, Overall Severity Severity of abnormal movements (highest score from questions above): None, normal Incapacitation due to abnormal movements: None, normal Patient's awareness of abnormal movements (rate only patient's report): No Awareness, Dental Status Current problems with teeth and/or dentures?: No Does patient usually wear dentures?: No  CIWA:    COWS:      Assessment- today  Reporting ongoing depression, and focused on multiple stressors. Although still somewhat irritable and dysphoric, affect is becoming more reactive and patient is better related overall. Homelessness and loss of bed at his shelter in [place] a major stressor for him at this time.  Ongoing passive SI reported. Denies current plan or intention of hurting self . Prefers regular adderall over XR presentation and wants to change back- this medication for ADHD symptoms- distractibility, lack of focus.    Treatment Plan Summary: Daily contact with patient to assess and evaluate symptoms and progress in treatment and Medication management  Daily contact with patient to assess and evaluate symptoms and progress in treatment and Medication management Supportive approach/coping skills D/C Adderal XR Restart Adderall 30 mgrs BID Continue   Celexa   20 mgrs QDAY to address depression , anxiety Continue Seroquel  50 mgrs QHS  for anxiety and insomnia  Continue Neurontin 1200 mg TID for anxiety and pain Continue Klonopin 0.5 mgrs TID PRN Anxiety Continue antiretroviral medications to address HIV  Continue Protonix and  add Carafate to address upper GI symptoms/ GERD symptoms.  SW to help patient look at other disposition options.   Continue with current treatment plan.    Medical Decision Making:  Review of Psycho-Social Stressors (1), New Problem, with no additional  work-up planned (3), Review of Last Therapy Session (1), Review of Medication Regimen & Side Effects (2) and Review of New Medication or Change in Dosage (2)   Nehemiah Massed,  MD  12/12/2014, 8:12 PM

## 2014-12-12 NOTE — [facility] Group Notes (Signed)
[facility] LCSW Aftercare Discharge Planning Group Note  12/12/2014 8:45 AM  Pt did not attend, declined invitation.   Paulina Muchmore Carter, LCSWA 12/12/2014 11:06 AM  

## 2014-12-12 NOTE — Progress Notes (Signed)
Recreation Therapy Notes  Date: 07.27.16 Time: 9:30 am Location: [address] Hall Group Room  Group Topic: Stress Management  Goal Area(s) Addresses:  Patient will verbalize importance of using healthy stress management.  Patient will identify positive emotions associated with healthy stress management.   Intervention: Stress Management  Activity :  Guided Imagery Script.  LRT introduced the technique of guided imagery.  Meadows script was used to deliver the technique to the patients.  Patients were asked to follow the script read Meadows loud by LRT to engage in the technique of guided imagery.  Education:  Stress Management, Discharge Planning.   Education Outcome: Acknowledges edcuation/In group clarification offered/Needs additional education  Clinical Observations/Feedback: Patient did not attend group.   Mason Meadows, LRT/CTRS         Mason Meadows 12/12/2014 12:24 PM 

## 2014-12-12 NOTE — Progress Notes (Signed)
Adult Psychoeducational Group Note  Date:  12/12/2014 Time:  9:52 PM  Group Topic/Focus:  Wrap-Up Group:   The focus of this group is to help patients review their daily goal of treatment and discuss progress on daily workbooks.  Participation Level:  Active  Participation Quality:  Appropriate  Affect:  Appropriate  Cognitive:  Appropriate  Insight: Appropriate  Engagement in Group:  Engaged  Modes of Intervention:  Discussion  Additional Comments:  The patient expressed that he did not attend group today  Octavio Manns 12/12/2014, 9:52 PM

## 2014-12-12 NOTE — Progress Notes (Signed)
Patient ID: Mason Meadows, male   DOB: 1976/01/26, 39 y.o.   MRN: [id number]  DAR: Pt. Denies HI and A/V Hallucinations. He continues to endorse SI and is able to contract for safety. Patient reports pain in teeth and hip that is chronic however does not use his Voltaren gel and states "it doesn't work." PRN Ibuprofen given to patient for tooth pain. Support and encouragement provided to the patient. Scheduled medications administered to patient late this morning due to patient not wanting to take it at scheduled time. MD Cobos was notified and for TID medications, times were adjusted accordingly with instruction from MD Cobos. Patient appeared depressed this morning and tearful. He stated he was feeling very hopeless and reported he just wanted to leave and, "start drinking and using drugs again." Writer spent 1:1 with him offering support. Later in the afternoon he was seen more in the milieu and interacting with peers. Q15 minute checks are maintained for safety.

## 2014-12-12 NOTE — Progress Notes (Signed)
Pt remains awake at this time. Pt slept approximately 1 hour this evening.

## 2014-12-12 NOTE — Progress Notes (Signed)
D. Pt had been up and visible in milieu this evening, did attend and participate in evening group activity. Pt has appeared depressed and spoke about how he lost his housing situation and is unsure about plans for discharge now. Pt did endorse depression and anxiety this evening, and did receive medications without incident. Pt did ask about the change in Aderrall and expressed that he is hopeful that the effects will be longer lasting. A. Support and encouragement provided. R. Safety maintained, will continue to monitor.

## 2014-12-12 NOTE — [facility] Group Notes (Signed)
[facility] LCSW Group Therapy 12/12/2014 1:15 PM  Type of Therapy: Group Therapy- Emotion Regulation  Pt did not attend, declined invitation.   Chad Cordial, LCSWA 12/12/2014 4:34 PM

## 2014-12-13 MED ORDER — AMPHETAMINE-DEXTROAMPHETAMINE 10 MG PO TABS
30.0000 mg | ORAL_TABLET | Freq: Two times a day (BID) | ORAL | Status: DC
  Administered 2014-12-13 – 2014-12-19 (×13): 30 mg via ORAL
  Filled 2014-12-13 (×14): qty 3

## 2014-12-13 NOTE — Progress Notes (Signed)
Patient ID: Mason Meadows, male   DOB: [birth year], 39 y.o.   MRN: [id number] Patient ID: Mason Meadows, male   DOB: 11-21-1975, 39 y.o.   MRN: [id number] [facility] MD Progress Note  12/13/2014 2:29 PM Mason Meadows  MRN:  [id number]   Subjective:  Mason Meadows says, "I guess I'm doing okay. I'm waiting for this ned medication for depression to start working because I'm still depressed & feeling hopeless for losing my bed at the shelter in [place]. I would like my Adderall dosing time to be changed. Taking the evening dose at 5:00 pm is causing to not sleep at night"  Objective:  Patient seen and reviewed with treatment team. Mason Meadows remains ruminative. He is, howeve, more reactive in affect & as session progressed. Major stressor continues to be homelessness, loss of bed at his [facility] shelter. Reports ongoing suicidal ruminations, passive SI, but no plan or intention of hurting self and able to contract for safety on the unit. Has not presented with any self injurious ideations on unit . Continues to focus on medication issues, Mason Meadows wants his Adderall dosing time changed to prevent insomnia. Denies any medication side effects. More responsive to support, empathy. Is active in the group milieu. No disruptive behaviors on unit. No agitation .   Principal Problem: Bipolar I disorder, most recent episode depressed Diagnosis:   Patient Active Problem List   Diagnosis Date Noted  . Alcohol abuse [F10.10] 12/07/2014  . Bipolar I disorder, most recent episode depressed [F31.30] 12/07/2014  . Mood disorder [F39] 12/06/2014  . Thrush [B37.0]   . 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 Noncompliance [Z91.19]   . Neutropenic fever [D70.9] 08/31/2014  . Fever [R50.9] 08/31/2014  . Chest pain [R07.9] 08/31/2014  . Pancytopenia [D61.818] 08/31/2014  . HIV (human immunodeficiency virus infection) [Z21] 08/31/2014  . Acute respiratory failure with hypoxia [J96.01] 05/31/2013  .  Presumed Pneumocystosis pneumonia [B59] 05/31/2013  . Severe protein-calorie malnutrition [E43] 05/31/2013  . AIDS [B20] 05/31/2013   Total Time spent with patient: 20 minutes   Past Medical History:  Past Medical History  Diagnosis Date  . Pneumonia   . Anxiety   . AIDS (acquired immune deficiency syndrome)   . Depression   . Mood disorder 12/06/2014    Past Surgical History  Procedure Laterality Date  . Back surgery    . Total hip arthroplasty     Family History:  Family History  Problem Relation Age of Onset  . Hypertension Mother   . Diabetes Mellitus II Mother   . Hypertension Father   . Diabetes Mellitus II Father    Social History:  History  Alcohol Use No     History  Drug Use No    Comment: last time -2 months ago    History   Social History  . Marital Status: Single    Spouse Name: N/A  . Number of Children: N/A  . Years of Education: N/A   Social History Main Topics  . Smoking status: Current Every Day Smoker -- 1.00 packs/day for 10 years    Types: Cigarettes  . Smokeless tobacco: Never Used  . Alcohol Use: No  . Drug Use: No     Comment: last time -2 months ago  . Sexual Activity: Yes     Comment: Hasn't smoked   Other Topics Concern  . None   Social History Narrative   Additional History:    Sleep: improved   Appetite:  Improved  Assessment: Today Mason Meadows reports ongoing depression & SI due to multiple stressors related to homelessness and loss of bed at his shelter in [place] area. Continue to endorse passive SI reported. Denies current plan or intention of hurting self. Prefers regular adderall over XR  and wants to switch dosing times to 0600 & 1300.  Musculoskeletal: Strength & Muscle Tone: within normal limits Gait & Station: normal Patient leans: N/A  Psychiatric Specialty Exam: Physical Exam  Nursing note and vitals reviewed. Constitutional: He is oriented to person, place, and time.  Neck: Normal range of  motion.  Respiratory: Effort normal.  Musculoskeletal: Normal range of motion.  Neurological: He is alert and oriented to person, place, and time.    Review of Systems  Musculoskeletal: Positive for myalgias, back pain and joint pain.  Skin: Positive for rash.  Psychiatric/Behavioral: Positive for depression, suicidal ideas and substance abuse. The patient is nervous/anxious and has insomnia.   All other systems reviewed and are negative.  reports some ongoing " heart burn ", denies vomiting , denies melenas .  Blood pressure 121/83, pulse 100, temperature 97.4 F (36.3 C), temperature source Oral, resp. rate 20, height 5\' 9"  (1.753 m), weight 91.627 kg (202 lb).Body mass index is 29.82 kg/(m^2).  General Appearance:  Improved grooming   Eye Contact::  Good  Speech:  Clear and Coherent  Volume:  Normal  Mood:    Describes ongoing depression  Affect: Feels hopeless, but more reactive   Thought Process:  Circumstantial and Linear  Orientation:  Full (Time, Place, and Person)  Thought Content:  Rumination- denies hallucinations , no delusions, not internally preoccupied at this time - ruminative about being homeless at this time  Suicidal Thoughts:  Yes.  without intent/plan- describes passive SI, states would not feel safe for discharge at this time, but denies any current plan or intention of hurting self or of SI, and contracts for safety on the unit.   Homicidal Thoughts:  No  Memory:  Immediate;   Good Recent;   Good Remote;   Good  Judgement:  Fair  Insight:  Fair  Psychomotor Activity:  Normal  Concentration:  Fair  Recall:  Good  Fund of Knowledge:Good  Language: Good  Akathisia:  No  Handed:  Right  AIMS (if indicated):     Assets:  Communication Skills Desire for Improvement  ADL's:  Intact  Cognition: WNL  Sleep:  Number of Hours: 5.75   Current Medications: Current Facility-Administered Medications  Medication Dose Route Frequency Provider Last Rate Last Dose  .  acetaminophen (TYLENOL) tablet 650 mg  650 mg Oral Q4H PRN Earney Navy, NP   650 mg at 12/10/14 2034  . alum & mag hydroxide-simeth (MAALOX/MYLANTA) 200-200-20 MG/5ML suspension 30 mL  30 mL Oral PRN Earney Navy, NP   30 mL at 12/11/14 1445  . amphetamine-dextroamphetamine (ADDERALL) tablet 30 mg  30 mg Oral BID WC Craige Cotta, MD   30 mg at 12/13/14 0750  . citalopram (CELEXA) tablet 20 mg  20 mg Oral Daily Craige Cotta, MD   20 mg at 12/13/14 0750  . clindamycin (CLEOCIN) capsule 300 mg  300 mg Oral 3 times per day Shuvon B Rankin, NP   300 mg at 12/13/14 1207  . clonazePAM (KLONOPIN) tablet 0.5 mg  0.5 mg Oral TID PRN Shuvon B Rankin, NP   0.5 mg at 12/13/14 1318  . diclofenac sodium (VOLTAREN) 1 % transdermal gel 2 g  2 g  Topical QID Shuvon B Rankin, NP   2 g at 12/13/14 0751  . dolutegravir (TIVICAY) tablet 50 mg  50 mg Oral Daily Earney Navy, NP   50 mg at 12/13/14 0750  . emtricitabine-tenofovir (TRUVADA) 200-300 MG per tablet 1 tablet  1 tablet Oral Daily Earney Navy, NP   1 tablet at 12/13/14 0750  . gabapentin (NEURONTIN) capsule 1,200 mg  1,200 mg Oral TID Earney Navy, NP   1,200 mg at 12/13/14 1207  . ibuprofen (ADVIL,MOTRIN) tablet 600 mg  600 mg Oral Q6H PRN Kizzie Fantasia, NP   600 mg at 12/13/14 0751  . nicotine (NICODERM CQ - dosed in mg/24 hours) patch 21 mg  21 mg Transdermal Daily Earney Navy, NP   21 mg at 12/13/14 0755  . ondansetron (ZOFRAN) tablet 4 mg  4 mg Oral Q8H PRN Earney Navy, NP      . pantoprazole (PROTONIX) EC tablet 20 mg  20 mg Oral Daily Craige Cotta, MD   20 mg at 12/13/14 0750  . QUEtiapine (SEROQUEL) tablet 50 mg  50 mg Oral QHS Craige Cotta, MD   50 mg at 12/11/14 2107  . sucralfate (CARAFATE) tablet 1 g  1 g Oral TID WC & HS Craige Cotta, MD   1 g at 12/13/14 1207  . traZODone (DESYREL) tablet 200 mg  200 mg Oral QHS PRN Rachael Fee, MD   200 mg at 12/11/14 2107    Lab Results:  No  results found for this or any previous visit (from the past 48 hour(s)).  Physical Findings: AIMS: Facial and Oral Movements Muscles of Facial Expression: None, normal Lips and Perioral Area: None, normal Jaw: None, normal Tongue: None, normal,Extremity Movements Upper (arms, wrists, hands, fingers): None, normal Lower (legs, knees, ankles, toes): None, normal, Trunk Movements Neck, shoulders, hips: None, normal, Overall Severity Severity of abnormal movements (highest score from questions above): None, normal Incapacitation due to abnormal movements: None, normal Patient's awareness of abnormal movements (rate only patient's report): No Awareness, Dental Status Current problems with teeth and/or dentures?: No Does patient usually wear dentures?: No  CIWA:    COWS:       Treatment Plan Summary: Daily contact with patient to assess and evaluate symptoms and progress in treatment and Medication management  Daily contact with patient to assess and evaluate symptoms and progress in treatment and Medication management Supportive approach/coping skills Adjusted Adderall 30 mgrs BID dosing time to 0600 am & 1300 due to complain of insomnia. Continue   Celexa   20 mgrs QDAY to address depression , anxiety Continue Seroquel  50 mgrs QHS  for anxiety and insomnia  Continue Neurontin 1200 mg TID for anxiety and pain management Continue Klonopin 0.5 mgrs TID PRN severe anxiety Continue antiretroviral medications to address HIV infections. Continue Protonix and add Carafate to address upper GI symptoms/ GERD symptoms.  SW to help patient look at other disposition options.  Continue with current treatment plan.    Medical Decision Making:  Review of Psycho-Social Stressors (1), New Problem, with no additional work-up planned (3), Review of Last Therapy Session (1), Review of Medication Regimen & Side Effects (2) and Review of New Medication or Change in Dosage (2)  Sanjuana Kava,  PMHNP-BC 12/13/2014, 2:29 PM I agree with assessment and plan Madie Reno A. Dub Mikes, M.D.

## 2014-12-13 NOTE — Progress Notes (Signed)
D: Patient complains of worsening depression and anxiety.  He rates his depression and hopelessness as a 9; anxiety as a 10.  He is observed in the day room laughing and playing monopoly with his peers.  He was having side conversations during group this morning.  He complains of severe tooth pain 10/10 with no relief.  His goal today is to "try to control my suicidal thoughts."  He is sleeping and eating well.  Patient presents with bright affect; he is pleasant and cooperative. A: Continue to monitor medication management and MD orders.  Safety checks completed every 15 minutes per protocol.  Offer support and encouragement as needed. R: Patient is interacting well with staff and peers.

## 2014-12-13 NOTE — [facility] Group Notes (Signed)
[facility] Group Notes:  (Nursing/MHT/Case Management/Adjunct)  Date:  12/13/2014  Time:  0900 am  Type of Therapy:  Psychoeducational Skills  Participation Level:  Active  Participation Quality:  Appropriate and Attentive  Affect:  Appropriate  Cognitive:  Alert and Appropriate  Insight:  Improving  Engagement in Group:  Engaged  Modes of Intervention:  Support  Summary of Progress/Problems: Patient states he "feels better today."  Patient had to be asked to not have side conversations during group.  Cranford Mon 12/13/2014, 9:36 AM

## 2014-12-13 NOTE — Progress Notes (Signed)
Patient ID: Mason Meadows, male   DOB: December 26, 1975, 39 y.o.   MRN: [id number] PER STATE REGULATIONS 482.30  THIS CHART WAS REVIEWED FOR MEDICAL NECESSITY WITH RESPECT TO THE PATIENT'S ADMISSION/ DURATION OF STAY.  NEXT REVIEW DATE: 12/18/2014  Willa Rough, RN, BSN CASE MANAGER

## 2014-12-13 NOTE — Tx Team (Signed)
Interdisciplinary Treatment Plan Update (Adult) Date: 12/13/2014    Time Reviewed: 9:30 AM  Progress in Treatment: Attending groups: Yes Participating in groups: Yes Taking medication as prescribed: Yes Tolerating medication: Yes Family/Significant other contact made: No, Pt declines family contact Patient understands diagnosis: Yes Discussing patient identified problems/goals with staff: Yes Medical problems stabilized or resolved: Yes Denies suicidal/homicidal ideation: Yes Issues/concerns per patient self-inventory: Yes Other:  New problem(s) identified: N/A  Discharge Plan or Barriers: 12/07/2014:  CSW continuing to assess, patient new to milieu. 12/11/2014: Continuing to assess. Pt wants to return to the [facility] area. 12/13/14: Pt would like to return to shelter in [facility] and continue with his opt providers at the [facility] ID clinic  Reason for Continuation of Hospitalization:  Depression Anxiety Medication Stabilization   Comments: N/A  Estimated length of stay: 3-5 days   Patient is a 39 year old male admitted for depression and anxiety following his release from jail. Patient was living in a recovery house in [place] prior to being arrested. Patient will benefit from crisis stabilization, medication evaluation, group therapy, and psycho education in addition to case management for discharge planning. Patient and CSW reviewed pt's identified goals and treatment plan. Pt verbalized understanding and agreed to treatment plan.     Review of initial/current patient goals per problem list:  1. Goal(s): Patient will participate in aftercare plan   Met: Yes   Target date: 3-5 days   As evidenced by: Patient will participate within aftercare plan AEB aftercare provider and housing plan at discharge being identified.  7/22: Goal not met: CSW assessing for appropriate referrals for pt and will have follow up secured prior to d/c. 7/26: Pt continues to be  unsure about his discharge plan. If he is unable to return to the [facility] in [place], [place] will also need a new provider. 7/28: Pt will return to [facility] and follow-up with pre-established providers.   2. Goal (s): Patient will exhibit decreased depressive symptoms and suicidal ideations.   Met: No   Target date: 3-5 days   As evidenced by: Patient will utilize self rating of depression at 3 or below and demonstrate decreased signs of depression or be deemed stable for discharge by MD.  7/22: Goal not met: Pt presents with flat affect and depressed mood.  Pt admitted with depression rating of 10.  Pt to show decreased sign of depression and a rating of 3 or less before d/c.   7/26: Pt continues to rate depression at 6/10, however presents with bright affect around peers. Pt also endorses passive SI. 7/28: Goal not met. Pt continues to express feelings of depression and exhibits symptoms of the same.    3. Goal(s): Patient will demonstrate decreased signs and symptoms of anxiety.   Met: No   Target date: 3-5 days   As evidenced by: Patient will utilize self rating of anxiety at 3 or below and demonstrated decreased signs of anxiety, or be deemed stable for discharge by MD  7/22: Goal not met: Pt presents with anxious mood and affect.  Pt admitted with anxiety rating of 10.  Pt to show decreased sign of anxiety and a rating of 3 or less before d/c. 7/26: Pt continues to rate anxiety highly, approximately 8/10. 12/13/2014: Goal not met. Pt continues to express feelings of anxiety and exhibits symptoms of the same.     4. Goal(s): Patient will demonstrate decreased signs of withdrawal due to substance abuse  Met: No   Target date: 3-5 day   As evidenced by: Patient will produce a CIWA/COWS score of 0, have stable vitals signs, and no symptoms of withdrawal  7/22: Goal met: No withdrawal symptoms reported at this time per medical  chart.    Attendees:  Patient:    Family:    Physician: Dr. Sabra Heck, MD  12/13/2014 1:42 PM  Nursing: Lars Pinks, RN Case manager  12/13/2014 1:42 PM  Clinical Social Worker Norman Clay, MSW 12/13/2014 1:42 PM  Other: Lucinda Dell, Beverly Sessions Liasion 12/13/2014 1:42 PM  Clinical:  Mayra Neer, RN; Jinny Sanders, RN 12/13/2014 1:42 PM  Other: , RN Charge Nurse 12/13/2014 1:42 PM  Other:       Scribe for Treatment Team:  Tilden Fossa, MSW, [employer] [phone]

## 2014-12-13 NOTE — [facility] Group Notes (Signed)
[facility] Group Therapy 12/13/2014 1:15pm  Type of Therapy: Mental Health Association Presentation  Participation Level: Active  Participation Quality: Attentive  Affect: Appropriate  Cognitive: Oriented  Insight: Developing/Improving  Engagement in Therapy: Engaged  Modes of Intervention: Discussion, Education and Socialization  Summary of Progress/Problems: Mental Health Association (MHA) Speaker came to talk about his personal journey with substance abuse and addiction. The pt processed ways by which to relate to the speaker. MHA speaker provided handouts and educational information pertaining to groups and services offered by the [facility]. Pt was engaged in speaker's presentation and was receptive to resources provided.    Chad Cordial, LCSWA 12/13/2014 1:39 PM

## 2014-12-13 NOTE — Progress Notes (Signed)
Pt has been observed in the dayroom talking with peers.  He states he has gone to groups today.  He says he had a fair day.  He said he felt tired most of the day.  He came to the NS after group and requested Klonopin for anxiety and pain med for tooth pain.  He was frustrated that the MD had not changed his Advil to something stronger, but did take the Advil.  He says he is still having some passive suicidal thoughts.  He denies Hi/AVH at this time.  After 2200, pt was asked about taking his scheduled meds, but he refused them, saying he did not need them tonight.  He plans to go back to the [facility] area and will probably go to a shelter.  He makes his needs known to staff.  Support and encouragement offered.  Safety maintained with q15 minute checks.

## 2014-12-13 NOTE — Progress Notes (Signed)
Attended Karaoke group tonight. 

## 2014-12-14 NOTE — Progress Notes (Signed)
Pt reports he has had a good day, but is still experiencing anxiety about his discharge plans.  He is still not sure where he will be going.  He denies HI/AVH, but says he is still having passive suicidal thoughts.  He has been observed in the dayroom most of the time talking and socializing with his peers, laughing and playing games.  He says he has been going to groups and participating.  He makes his needs known to staff.  He has been taking his prn Klonopin for anxiety as ordered.  Support and encouragement offered.  Safety maintained with q15 minute checks.

## 2014-12-14 NOTE — [facility] Group Notes (Signed)
[facility] LCSW Group Therapy 12/14/2014 1:15pm  Type of Therapy: Group Therapy- Feelings Around Relapse and Recovery  Participation Level: Active   Participation Quality:  Appropriate  Affect:  Appropriate  Cognitive: Alert and Oriented   Insight:  Developing   Engagement in Therapy: Developing/Improving and Engaged   Modes of Intervention: Clarification, Confrontation, Discussion, Education, Exploration, Limit-setting, Orientation, Problem-solving, Rapport Building, Dance movement psychotherapist, Socialization and Support  Summary of Progress/Problems: The topic for today was feelings about relapse. The group discussed what relapse prevention is to them and identified triggers that they are on the path to relapse. Members also processed their feeling towards relapse and were able to relate to common experiences. Group also discussed coping skills that can be used for relapse prevention.  Pt was engaged in group discussion, however was monopolizing at times. Pt has also been asking similar questions in group sessions despite suggestions and explanations given by peers and CSW. Pt continues to be focused on his belief that he "cannot control his depression."  Pt is unable to provide alternative methods to coping besides "waiting out the depression." Pt appears to have limited insight regarding perspective of others.   Therapeutic Modalities:   Cognitive Behavioral Therapy Solution-Focused Therapy Assertiveness Training Relapse Prevention Therapy    Mason Meadows [phone] 12/14/2014 3:46 PM

## 2014-12-14 NOTE — Progress Notes (Signed)
Patient ID: Mason Meadows, male   DOB: 1976/04/19, 39 y.o.   MRN: [id number] Patient ID: Mason Meadows, male   DOB: [birth year], 39 y.o.   MRN: [id number] Patient ID: Mason Meadows, male   DOB: 12/10/1975, 39 y.o.   MRN: [id number] [facility] MD Progress Note  12/14/2014 2:58 PM Arne Schlender  MRN:  [id number]   Subjective:  Mason Meadows says, "I feel very depressed today. I'm very anxious too. 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 I have no energy, no motivation. My life is on a stand-still. I don't have anyone to love me, you know, like a partner to spend time with. I believe I was doing better on Wellbutrin. I'm wondering why I am on Celexa?   Objective:  Patient seen and reviewed with treatment team. Mason Meadows remains ruminative. He says he is more depressed & anxious today. Major stressor continues to be homelessness, loss of bed at his [facility] shelter. Reports ongoing suicidal ruminations, passive SI, but no plan or intention of hurting self and able to contract for safety on the unit. Has not presented with any self injurious ideations on unit . Continues to focus on medication issues, his Adderall dosing time was changed yesterday to prevent insomnia. Denies any medication side effects. More responsive to support, empathy. Is active in the group milieu. No disruptive behaviors on unit. No agitation. Discontinued Celexa, started Wellbutrin XL 150 mg daily for depression.  Principal Problem: Bipolar I disorder, most recent episode depressed Diagnosis:   Patient Active Problem List   Diagnosis Date Noted  . Alcohol abuse [F10.10] 12/07/2014  . Bipolar I disorder, most recent episode depressed [F31.30] 12/07/2014  . Mood disorder [F39] 12/06/2014  . Thrush [B37.0]   . 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 Noncompliance [Z91.19]   . Neutropenic fever [D70.9] 08/31/2014  . Fever [R50.9] 08/31/2014  . Chest pain [R07.9] 08/31/2014  . Pancytopenia  [D61.818] 08/31/2014  . HIV (human immunodeficiency virus infection) [Z21] 08/31/2014  . Acute respiratory failure with hypoxia [J96.01] 05/31/2013  . Presumed Pneumocystosis pneumonia [B59] 05/31/2013  . Severe protein-calorie malnutrition [E43] 05/31/2013  . AIDS [B20] 05/31/2013   Total Time spent with patient: 20 minutes   Past Medical History:  Past Medical History  Diagnosis Date  . Pneumonia   . Anxiety   . AIDS (acquired immune deficiency syndrome)   . Depression   . Mood disorder 12/06/2014    Past Surgical History  Procedure Laterality Date  . Back surgery    . Total hip arthroplasty     Family History:  Family History  Problem Relation Age of Onset  . Hypertension Mother   . Diabetes Mellitus II Mother   . Hypertension Father   . Diabetes Mellitus II Father    Social History:  History  Alcohol Use No     History  Drug Use No    Comment: last time -2 months ago    History   Social History  . Marital Status: Single    Spouse Name: N/A  . Number of Children: N/A  . Years of Education: N/A   Social History Main Topics  . Smoking status: Current Every Day Smoker -- 1.00 packs/day for 10 years    Types: Cigarettes  . Smokeless tobacco: Never Used  . Alcohol Use: No  . Drug Use: No     Comment: last time -2 months ago  . Sexual Activity: Yes     Comment: Hasn't smoked  Other Topics Concern  . None   Social History Narrative   Additional History:    Sleep: improved   Appetite:  Improved    Assessment: Today Mason Meadows reports ongoing depression & SI due to multiple stressors related to homelessness and loss of bed at his shelter in [place] area. Continue to endorse passive SI reported. Denies current plan or intention of hurting self. Prefers regular adderall over XR  and wants to switch dosing times to 0600 & 1300.  Musculoskeletal: Strength & Muscle Tone: within normal limits Gait & Station: normal Patient leans: N/A  Psychiatric  Specialty Exam: Physical Exam  Nursing note and vitals reviewed. Constitutional: He is oriented to person, place, and time.  Neck: Normal range of motion.  Respiratory: Effort normal.  Musculoskeletal: Normal range of motion.  Neurological: He is alert and oriented to person, place, and time.    Review of Systems  Musculoskeletal: Positive for myalgias, back pain and joint pain.  Skin: Positive for rash.  Psychiatric/Behavioral: Positive for depression, suicidal ideas and substance abuse. The patient is nervous/anxious and has insomnia.   All other systems reviewed and are negative.  reports some ongoing " heart burn ", denies vomiting , denies melenas .  Blood pressure 119/87, pulse 97, temperature 98 F (36.7 C), temperature source Oral, resp. rate 20, height 5\' 9"  (1.753 m), weight 91.627 kg (202 lb).Body mass index is 29.82 kg/(m^2).  General Appearance:  Improved grooming   Eye Contact::  Good  Speech:  Clear and Coherent  Volume:  Normal  Mood:    Describes ongoing depression  Affect: Feels hopeless, but more reactive   Thought Process:  Circumstantial and Linear  Orientation:  Full (Time, Place, and Person)  Thought Content:  Rumination- denies hallucinations , no delusions, not internally preoccupied at this time - ruminative about being homeless at this time  Suicidal Thoughts:  Yes.  without intent/plan- describes passive SI, states would not feel safe for discharge at this time, but denies any current plan or intention of hurting self or of SI, and contracts for safety on the unit.   Homicidal Thoughts:  No  Memory:  Immediate;   Good Recent;   Good Remote;   Good  Judgement:  Fair  Insight:  Fair  Psychomotor Activity:  Normal  Concentration:  Fair  Recall:  Good  Fund of Knowledge:Good  Language: Good  Akathisia:  No  Handed:  Right  AIMS (if indicated):     Assets:  Communication Skills Desire for Improvement  ADL's:  Intact  Cognition: WNL  Sleep:  Number  of Hours: 5.75   Current Medications: Current Facility-Administered Medications  Medication Dose Route Frequency Provider Last Rate Last Dose  . acetaminophen (TYLENOL) tablet 650 mg  650 mg Oral Q4H PRN Earney Navy, NP   650 mg at 12/10/14 2034  . alum & mag hydroxide-simeth (MAALOX/MYLANTA) 200-200-20 MG/5ML suspension 30 mL  30 mL Oral PRN Earney Navy, NP   30 mL at 12/11/14 1445  . amphetamine-dextroamphetamine (ADDERALL) tablet 30 mg  30 mg Oral BID WC Sanjuana Kava, NP   30 mg at 12/14/14 1200  . citalopram (CELEXA) tablet 20 mg  20 mg Oral Daily Craige Cotta, MD   20 mg at 12/14/14 0845  . clindamycin (CLEOCIN) capsule 300 mg  300 mg Oral 3 times per day Shuvon B Rankin, NP   300 mg at 12/14/14 1444  . clonazePAM (KLONOPIN) tablet 0.5 mg  0.5 mg  Oral TID PRN Shuvon B Rankin, NP   0.5 mg at 12/14/14 1232  . diclofenac sodium (VOLTAREN) 1 % transdermal gel 2 g  2 g Topical QID Shuvon B Rankin, NP   2 g at 12/14/14 0845  . dolutegravir (TIVICAY) tablet 50 mg  50 mg Oral Daily Earney Navy, NP   50 mg at 12/14/14 0844  . emtricitabine-tenofovir (TRUVADA) 200-300 MG per tablet 1 tablet  1 tablet Oral Daily Earney Navy, NP   1 tablet at 12/14/14 0843  . gabapentin (NEURONTIN) capsule 1,200 mg  1,200 mg Oral TID Earney Navy, NP   1,200 mg at 12/14/14 1158  . ibuprofen (ADVIL,MOTRIN) tablet 600 mg  600 mg Oral Q6H PRN Kizzie Fantasia, NP   600 mg at 12/14/14 0618  . nicotine (NICODERM CQ - dosed in mg/24 hours) patch 21 mg  21 mg Transdermal Daily Earney Navy, NP   21 mg at 12/14/14 0846  . ondansetron (ZOFRAN) tablet 4 mg  4 mg Oral Q8H PRN Earney Navy, NP      . pantoprazole (PROTONIX) EC tablet 20 mg  20 mg Oral Daily Craige Cotta, MD   20 mg at 12/14/14 0843  . QUEtiapine (SEROQUEL) tablet 50 mg  50 mg Oral QHS Craige Cotta, MD   50 mg at 12/13/14 2153  . sucralfate (CARAFATE) tablet 1 g  1 g Oral TID WC & HS Craige Cotta, MD   1 g  at 12/14/14 1159  . traZODone (DESYREL) tablet 200 mg  200 mg Oral QHS PRN Rachael Fee, MD   200 mg at 12/11/14 2107    Lab Results:  No results found for this or any previous visit (from the past 48 hour(s)).  Physical Findings: AIMS: Facial and Oral Movements Muscles of Facial Expression: None, normal Lips and Perioral Area: None, normal Jaw: None, normal Tongue: None, normal,Extremity Movements Upper (arms, wrists, hands, fingers): None, normal Lower (legs, knees, ankles, toes): None, normal, Trunk Movements Neck, shoulders, hips: None, normal, Overall Severity Severity of abnormal movements (highest score from questions above): None, normal Incapacitation due to abnormal movements: None, normal Patient's awareness of abnormal movements (rate only patient's report): No Awareness, Dental Status Current problems with teeth and/or dentures?: No Does patient usually wear dentures?: No  CIWA:    COWS:       Treatment Plan Summary: Daily contact with patient to assess and evaluate symptoms and progress in treatment and Medication management  Daily contact with patient to assess and evaluate symptoms and progress in treatment and Medication management Supportive approach/coping skills Adjusted Adderall 30 mgrs BID dosing time to 0600 am & 1300 due to complain of insomnia. Discontinue Celexa  20 mgrs Q DAY Initiate Wellbutrin XL 150 mg for depression. Continue Seroquel  50 mgrs QHS  for anxiety and insomnia  Continue Neurontin 1200 mg TID for anxiety and pain management Continue Klonopin 0.5 mgrs TID PRN severe anxiety Continue antiretroviral medications to address HIV infections. Continue Protonix and add Carafate to address upper GI symptoms/ GERD symptoms.  SW to help patient look at other disposition options.  Continue with current treatment plan.    Medical Decision Making:  Review of Psycho-Social Stressors (1), New Problem, with no additional work-up planned (3), Review  of Last Therapy Session (1), Review of Medication Regimen & Side Effects (2) and Review of New Medication or Change in Dosage (2)  Sanjuana Kava, PMHNP-BC 12/14/2014, 2:58 PM I agree  with assessment and plan Madie Reno A. Dub Mikes, M.D.

## 2014-12-14 NOTE — [facility] Group Notes (Signed)
[facility] LCSW Aftercare Discharge Planning Group Note  12/14/2014 8:45 AM  Pt did not attend, declined invitation.   Chad Cordial, LCSWA 12/14/2014 11:57 AM

## 2014-12-14 NOTE — Progress Notes (Signed)
Adult Psychoeducational Group Note  Date:  12/14/2014 Time:  9:24 PM  Group Topic/Focus:  Wrap-Up Group:   The focus of this group is to help patients review their daily goal of treatment and discuss progress on daily workbooks.  Participation Level:  None  Participation Quality:  Inattentive  Affect:  Resistant  Cognitive:  Lacking  Insight: None  Engagement in Group:  None  Modes of Intervention:  Activity  Additional Comments:  Patient came to group but did not participate, did not want to talk or share.  Claria Dice Fatuma Dowers 12/14/2014, 9:24 PM

## 2014-12-14 NOTE — Progress Notes (Signed)
D:  Per pt self inventory pt reports sleeping is fair, appetite is fair, energy level is fair, rates depression at a 9/10 , rates hopelessness at a 9/10, although pt is laughing and joking with peers. Reports feeling overwhelmed discussing discharge plans.  A:  Support and encouragement provided, encouraged pt to attend all groups and activities, q15 minute checks continued for safety. Allowed pt to ventilate during 1:1. Encouraged to rinse month for toothache pt refuse also reminded pt to f/u at discharge with dentist.  R- Will continue to monitor on q 15 minute checks for safety, compliant with medications and programing

## 2014-12-14 NOTE — Progress Notes (Signed)
Recreation Therapy Notes  Date: 07.29.16 Time: 9:30 am Location: [address] Group Room  Group Topic: Stress Management  Goal Area(s) Addresses:  Patient will verbalize importance of using healthy stress management.  Patient will identify positive emotions associated with healthy stress management.   Intervention: Stress Management  Activity :  Progressive Muscle Relaxation.  LRT introduced and educated patients on the technique of progressive muscle relaxation.  A script was used to deliver the technique to the patients.  Patients were asked to follow the script read a loud by the LRT to engage in practicing the stress management technique.  Education:  Stress Management, Discharge Planning.   Education Outcome: Acknowledges edcuation/In group clarification offered/Needs additional education  Clinical Observations/Feedback: Patient did not attend group.   Caroll Rancher, LRT/CTRS         Lillia Abed, Paislie Tessler A 12/14/2014 3:11 PM

## 2014-12-15 MED ORDER — BUPROPION HCL ER (XL) 150 MG PO TB24
150.0000 mg | ORAL_TABLET | Freq: Every day | ORAL | Status: DC
  Administered 2014-12-15 – 2014-12-18 (×4): 150 mg via ORAL
  Filled 2014-12-15 (×7): qty 1

## 2014-12-15 MED ORDER — LORATADINE 10 MG PO TABS
10.0000 mg | ORAL_TABLET | Freq: Every day | ORAL | Status: DC
  Administered 2014-12-15 – 2014-12-19 (×5): 10 mg via ORAL
  Filled 2014-12-15 (×8): qty 1

## 2014-12-15 NOTE — Progress Notes (Signed)
D: Patient has brighter affect today.  States, "I'm really having a good day."  Patient has been interacting well with his peers.  He is attending groups and participating.  He remains with passive SI, however, he contracts for safety on the unit.  Patient's goal today is to "just feel relaxed and stay positive."  Patient is still concerned about where he will go after discharge.  He is anxious over losing his place at the community center where he was residing.  Patient continues to complain of severe tooth pain. A: Continue to monitor medication management and MD orders.  Safety checks completed every 15 minutes per protocol.  Offer support and encouragement as needed. R: Patient is calm and cooperative.

## 2014-12-15 NOTE — [facility] Group Notes (Signed)
[facility] Group Notes:  (Nursing/MHT/Case Management/Adjunct)  Date:  12/15/2014  Time:  11:10 PM  Type of Therapy:  Psychoeducational Skills  Participation Level:  Did Not Attend  Participation Quality:  Resistant  Affect:  Resistant  Cognitive:  Alert and Oriented  Insight:  None  Engagement in Group:  Resistant  Modes of Intervention:  Support  Summary of Progress/Problems: Pt. Did not attend wrap up stating he was tired and wanted to rest but pt. Came into dayroom as soon as group was over.  Cooper Render 12/15/2014, 11:10 PM

## 2014-12-15 NOTE — Progress Notes (Signed)
Pt has been more sullen this evening than in past evenings.  He has been in his room most of the time.  He did go to group, but he did not participate.  He is still endorsing SI, but denies HI/AVH.  He presents flat and depressed.  He has already taken all the prn doses of klonopin for the day.  He requested ibuprofen for a headache which was given.  He refused all of his scheduled medications for the night, even the antibiotic for his tooth.  Pt was reminded that he needed to take all the doses of antibiotic, but he still refused.  Support and encouragement offered.  Discharge plans are still in process.  Pt says he does not want to talk about discharge.  Safety maintained with q15 minute checks.

## 2014-12-15 NOTE — [facility] Group Notes (Signed)
[facility] LCSW Group Therapy  12/15/2014 1:15 PM  Type of Therapy:  Group Therapy  Participation Level:  Active  Participation Quality:  Attentive, Intrusive and Sharing  Affect:  Appropriate  Cognitive:  Appropriate  Insight:  Distracting and Limited  Engagement in Therapy:  Monopolizing  Modes of Intervention:  Discussion, Limit-setting, Rapport Building, Socialization and Support  Summary of Progress/Problems: The main focus of today's process group was to learn how to use a decisional balance exercise to move forward in the Stages of Change, which were described and discussed. Motivational Interviewing and the whiteboard were utilized to help patients explore in depth the perceived benefits and costs of unhealthy coping techniques, as well as the benefits and costs of replacing that with a healthy coping skills.Mason Meadows shared during group warm up his excitement regarding recent move and plan to develop healthier supports. Patient shared high motivation ("at a 10") to be compliant with his medications as noncompliance has historically led to self medication/substance abuse problems. Patient required frequent redirection yet was responsive to redirection.   Mason Meadows 12/15/2014  3:36 PM

## 2014-12-15 NOTE — Progress Notes (Signed)
D.  Pt pleasant on approach, did not feel well enough to attend evening wrap up group due to toothache.  Pt continues to endorse passive SI,denies HI/hallucinations at this time.  Interacting appropriately with peers on the unit. States the only medication for sleep he wants to take tonight is his Klonopin.  A.  Support and encouragement offered  R.  Pt remains safe on unit, will continue to monitor.

## 2014-12-15 NOTE — Progress Notes (Signed)
Patient ID: Mason Meadows, male   DOB: 1976-05-15, 39 y.o.   MRN: [id number] [facility] MD Progress Note  12/15/2014 3:44 PM Mason Meadows  MRN:  [id number]   Subjective:  Mason Meadows, "I am feeling somewhat less depressed today. I was off my medications for a while. Still have some passive SI but I feel like I am more future oriented. I am thinking about where to live and get back on my feet. I have a toothache that was disrupting my sleep and I had missed an appointment with the Dentist."   Objective:  Patient seen and reviewed with treatment team. Mason Meadows appears less ruminative. He Meadows he is less depressed & anxious today. Major stressor continues to be homelessness, loss of bed at his [facility] shelter. Reports ongoing suicidal ruminations, passive SI, but no plan or intention of hurting self and able to contract for safety on the unit. Has not presented with any self injurious ideations on unit . Denies any medication side effects. More responsive to support, empathy. Is active in the group milieu. No disruptive behaviors on unit. No agitation. His Celexa has been discontinued as patient reported doing better on Wellbutrin in the past. Patient is very pleasant and engages easily in the assessment.   Principal Problem: Bipolar I disorder, most recent episode depressed Diagnosis:   Patient Active Problem List   Diagnosis Date Noted  . Alcohol abuse [F10.10] 12/07/2014  . Bipolar I disorder, most recent episode depressed [F31.30] 12/07/2014  . Mood disorder [F39] 12/06/2014  . Thrush [B37.0]   . 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 Noncompliance [Z91.19]   . Neutropenic fever [D70.9] 08/31/2014  . Fever [R50.9] 08/31/2014  . Chest pain [R07.9] 08/31/2014  . Pancytopenia [D61.818] 08/31/2014  . HIV (human immunodeficiency virus infection) [Z21] 08/31/2014  . Acute respiratory failure with hypoxia [J96.01] 05/31/2013  . Presumed Pneumocystosis pneumonia [B59] 05/31/2013   . Severe protein-calorie malnutrition [E43] 05/31/2013  . AIDS [B20] 05/31/2013   Total Time spent with patient: 20 minutes   Past Medical History:  Past Medical History  Diagnosis Date  . Pneumonia   . Anxiety   . AIDS (acquired immune deficiency syndrome)   . Depression   . Mood disorder 12/06/2014    Past Surgical History  Procedure Laterality Date  . Back surgery    . Total hip arthroplasty     Family History:  Family History  Problem Relation Age of Onset  . Hypertension Mother   . Diabetes Mellitus II Mother   . Hypertension Father   . Diabetes Mellitus II Father    Social History:  History  Alcohol Use No     History  Drug Use No    Comment: last time -2 months ago    History   Social History  . Marital Status: Single    Spouse Name: N/A  . Number of Children: N/A  . Years of Education: N/A   Social History Main Topics  . Smoking status: Current Every Day Smoker -- 1.00 packs/day for 10 years    Types: Cigarettes  . Smokeless tobacco: Never Used  . Alcohol Use: No  . Drug Use: No     Comment: last time -2 months ago  . Sexual Activity: Yes     Comment: Hasn't smoked   Other Topics Concern  . None   Social History Narrative   Additional History:    Sleep: improved   Appetite:  Improved    Assessment: Today Mason Meadows  reports ongoing depression & SI due to multiple stressors related to homelessness and loss of bed at his shelter in [place] area. Continue to endorse passive SI reported. Denies current plan or intention of hurting self. Prefers regular adderall over XR  and wants to switch dosing times to 0600 & 1300.  Musculoskeletal: Strength & Muscle Tone: within normal limits Gait & Station: normal Patient leans: N/A  Psychiatric Specialty Exam: Physical Exam  Nursing note and vitals reviewed. Constitutional: He is oriented to person, place, and time.  Neck: Normal range of motion.  Respiratory: Effort normal.   Musculoskeletal: Normal range of motion.  Neurological: He is alert and oriented to person, place, and time.    Review of Systems  Constitutional: Negative.   HENT: Negative.   Eyes: Negative.   Respiratory: Negative.   Cardiovascular: Negative.   Gastrointestinal: Negative.   Genitourinary: Negative.   Musculoskeletal: Positive for myalgias, back pain and joint pain.  Skin: Positive for rash.  Neurological: Negative.   Endo/Heme/Allergies: Negative.   Psychiatric/Behavioral: Positive for depression, suicidal ideas and substance abuse. Negative for hallucinations and memory loss. The patient is nervous/anxious. The patient does not have insomnia.   All other systems reviewed and are negative.  reports some ongoing " heart burn ", denies vomiting , denies melenas .  Blood pressure 147/107, pulse 116, temperature 97.3 F (36.3 C), temperature source Oral, resp. rate 16, height  (1.753 m), weight 91.627 kg (202 lb).Body mass index is 29.82 kg/(m^2).  General Appearance: Casual  Eye Contact::  Good  Speech:  Clear and Coherent  Volume:  Normal  Mood:  Depressed  Affect: Feels hopeless, but more reactive   Thought Process:  Circumstantial and Linear  Orientation:  Full (Time, Place, and Person)  Thought Content:  Rumination- denies hallucinations , no delusions, not internally preoccupied at this time - ruminative about being homeless at this time  Suicidal Thoughts:  Yes.  without intent/plan- describes passive SI, states would not feel safe for discharge at this time, but denies any current plan or intention of hurting self or of SI, and contracts for safety on the unit.   Homicidal Thoughts:  No  Memory:  Immediate;   Good Recent;   Good Remote;   Good  Judgement:  Fair  Insight:  Fair  Psychomotor Activity:  Normal  Concentration:  Fair  Recall:  Good  Fund of Knowledge:Good  Language: Good  Akathisia:  No  Handed:  Right  AIMS (if indicated):     Assets:   Communication Skills Desire for Improvement Leisure Time Resilience  ADL's:  Intact  Cognition: WNL  Sleep:  Number of Hours: 5.75   Current Medications: Current Facility-Administered Medications  Medication Dose Route Frequency Provider Last Rate Last Dose  . acetaminophen (TYLENOL) tablet 650 mg  650 mg Oral Q4H PRN Earney Navy, NP   650 mg at 12/10/14 2034  . alum & mag hydroxide-simeth (MAALOX/MYLANTA) 200-200-20 MG/5ML suspension 30 mL  30 mL Oral PRN Earney Navy, NP   30 mL at 12/11/14 1445  . amphetamine-dextroamphetamine (ADDERALL) tablet 30 mg  30 mg Oral BID WC Sanjuana Kava, NP   30 mg at 12/15/14 1148  . buPROPion (WELLBUTRIN XL) 24 hr tablet 150 mg  150 mg Oral Daily Thermon Leyland, NP   150 mg at 12/15/14 1317  . clindamycin (CLEOCIN) capsule 300 mg  300 mg Oral 3 times per day Shuvon B Rankin, NP   300 mg  at 12/15/14 1318  . clonazePAM (KLONOPIN) tablet 0.5 mg  0.5 mg Oral TID PRN Shuvon B Rankin, NP   0.5 mg at 12/15/14 1055  . diclofenac sodium (VOLTAREN) 1 % transdermal gel 2 g  2 g Topical QID Shuvon B Rankin, NP   2 g at 12/14/14 0845  . dolutegravir (TIVICAY) tablet 50 mg  50 mg Oral Daily Earney Navy, NP   50 mg at 12/15/14 0748  . emtricitabine-tenofovir (TRUVADA) 200-300 MG per tablet 1 tablet  1 tablet Oral Daily Earney Navy, NP   1 tablet at 12/15/14 0748  . gabapentin (NEURONTIN) capsule 1,200 mg  1,200 mg Oral TID Earney Navy, NP   1,200 mg at 12/15/14 1148  . ibuprofen (ADVIL,MOTRIN) tablet 600 mg  600 mg Oral Q6H PRN Kizzie Fantasia, NP   600 mg at 12/15/14 0549  . nicotine (NICODERM CQ - dosed in mg/24 hours) patch 21 mg  21 mg Transdermal Daily Earney Navy, NP   21 mg at 12/15/14 0858  . ondansetron (ZOFRAN) tablet 4 mg  4 mg Oral Q8H PRN Earney Navy, NP      . pantoprazole (PROTONIX) EC tablet 20 mg  20 mg Oral Daily Craige Cotta, MD   20 mg at 12/15/14 0749  . QUEtiapine (SEROQUEL) tablet 50 mg  50 mg Oral  QHS Craige Cotta, MD   50 mg at 12/13/14 2153  . sucralfate (CARAFATE) tablet 1 g  1 g Oral TID WC & HS Craige Cotta, MD   1 g at 12/15/14 1148  . traZODone (DESYREL) tablet 200 mg  200 mg Oral QHS PRN Rachael Fee, MD   200 mg at 12/11/14 2107    Lab Results:  No results found for this or any previous visit (from the past 48 hour(s)).  Physical Findings: AIMS: Facial and Oral Movements Muscles of Facial Expression: None, normal Lips and Perioral Area: None, normal Jaw: None, normal Tongue: None, normal,Extremity Movements Upper (arms, wrists, hands, fingers): None, normal Lower (legs, knees, ankles, toes): None, normal, Trunk Movements Neck, shoulders, hips: None, normal, Overall Severity Severity of abnormal movements (highest score from questions above): None, normal Incapacitation due to abnormal movements: None, normal Patient's awareness of abnormal movements (rate only patient's report): No Awareness, Dental Status Current problems with teeth and/or dentures?: No Does patient usually wear dentures?: No  CIWA:    COWS:       Treatment Plan Summary: Daily contact with patient to assess and evaluate symptoms and progress in treatment and Medication management  Daily contact with patient to assess and evaluate symptoms and progress in treatment and Medication management Supportive approach/coping skills Continue  Adderall 30 mgrs BID dosing time to 0600 am & 1300 due to complain of insomnia. Continue Wellbutrin XL 150 mg for depression. Continue Seroquel  50 mgrs QHS  for anxiety and insomnia  Continue Neurontin 1200 mg TID for anxiety and pain management Continue Klonopin 0.5 mgrs TID PRN severe anxiety Continue antiretroviral medications to address HIV infections. Continue Protonix and add Carafate to address upper GI symptoms/ GERD symptoms.  SW to help patient look at other disposition options.  Continue with current treatment plan.    Medical Decision Making:   Review of Psycho-Social Stressors (1), New Problem, with no additional work-up planned (3), Review of Last Therapy Session (1), Review of Medication Regimen & Side Effects (2) and Review of New Medication or Change in Dosage (2)  DAVIS, LAURA,  NP-C 12/15/2014, 3:44 PM  I reviewed chart and agreed with the findings and treatment Plan.  Kathryne Sharper, MD

## 2014-12-16 MED ORDER — METRONIDAZOLE 0.75 % EX GEL
Freq: Two times a day (BID) | CUTANEOUS | Status: DC
  Administered 2014-12-16 – 2014-12-18 (×3): via TOPICAL
  Filled 2014-12-16: qty 45

## 2014-12-16 MED ORDER — QUETIAPINE FUMARATE 25 MG PO TABS
25.0000 mg | ORAL_TABLET | Freq: Every day | ORAL | Status: DC
  Filled 2014-12-16 (×4): qty 1
  Filled 2014-12-16: qty 14

## 2014-12-16 MED ORDER — METRONIDAZOLE 0.75 % EX CREA
TOPICAL_CREAM | Freq: Two times a day (BID) | CUTANEOUS | Status: DC
  Filled 2014-12-16: qty 45

## 2014-12-16 NOTE — Progress Notes (Signed)
Patient ID: Mason Meadows, male   DOB: Nov 30, 1975, 39 y.o.   MRN: [id number] [facility] MD Progress Note  12/16/2014 2:19 PM Mason Meadows  MRN:  [id number]   Subjective:  Mason Meadows says, "I am a bit groggy this morning. I am still about the same. I am really trying to stay positive but I have a lot of stressors. Still feeling depressed but it's better. I'm wondering when the Wellbutrin can be increased."   Objective:  Patient seen and reviewed with treatment team. Mason Meadows appears less ruminative. He says he is less depressed & anxious today. Major stressor continues to be homelessness, loss of bed at his [facility] shelter. Reports ongoing suicidal ruminations, passive SI, but no plan or intention of hurting self and able to contract for safety on the unit. Has not presented with any self injurious ideations on unit . Denies any medication side effects. Is active in the group milieu. No disruptive behaviors on unit. No agitation. His Celexa has been discontinued as patient reported doing better on Wellbutrin in the past. Patient is very pleasant and engages easily in the assessment. It was explained that it was too soon to increase the Wellbutrin but will decrease the Seroquel due to increased sedation in the morning.   Principal Problem: Bipolar I disorder, most recent episode depressed Diagnosis:   Patient Active Problem List   Diagnosis Date Noted  . Alcohol abuse [F10.10] 12/07/2014  . Bipolar I disorder, most recent episode depressed [F31.30] 12/07/2014  . Mood disorder [F39] 12/06/2014  . Thrush [B37.0]   . 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 Noncompliance [Z91.19]   . Neutropenic fever [D70.9] 08/31/2014  . Fever [R50.9] 08/31/2014  . Chest pain [R07.9] 08/31/2014  . Pancytopenia [D61.818] 08/31/2014  . HIV (human immunodeficiency virus infection) [Z21] 08/31/2014  . Acute respiratory failure with hypoxia [J96.01] 05/31/2013  . Presumed Pneumocystosis pneumonia [B59]  05/31/2013  . Severe protein-calorie malnutrition [E43] 05/31/2013  . AIDS [B20] 05/31/2013   Total Time spent with patient: 20 minutes  Past Medical History:  Past Medical History  Diagnosis Date  . Pneumonia   . Anxiety   . AIDS (acquired immune deficiency syndrome)   . Depression   . Mood disorder 12/06/2014    Past Surgical History  Procedure Laterality Date  . Back surgery    . Total hip arthroplasty     Family History:  Family History  Problem Relation Age of Onset  . Hypertension Mother   . Diabetes Mellitus II Mother   . Hypertension Father   . Diabetes Mellitus II Father    Social History:  History  Alcohol Use No     History  Drug Use No    Comment: last time -2 months ago    History   Social History  . Marital Status: Single    Spouse Name: N/A  . Number of Children: N/A  . Years of Education: N/A   Social History Main Topics  . Smoking status: Current Every Day Smoker -- 1.00 packs/day for 10 years    Types: Cigarettes  . Smokeless tobacco: Never Used  . Alcohol Use: No  . Drug Use: No     Comment: last time -2 months ago  . Sexual Activity: Yes     Comment: Hasn't smoked   Other Topics Concern  . None   Social History Narrative   Additional History:    Sleep: improved   Appetite:  Improved    Assessment: Today Mason Meadows reports  ongoing depression & SI due to multiple stressors related to homelessness and loss of bed at his shelter in [place] area. Continue to endorse passive SI reported. Denies current plan or intention of hurting self. Prefers regular adderall over XR  and wants to switch dosing times to 0600 & 1300.  Musculoskeletal: Strength & Muscle Tone: within normal limits Gait & Station: normal Patient leans: N/A  Psychiatric Specialty Exam: Physical Exam  Nursing note and vitals reviewed. Constitutional: He is oriented to person, place, and time.  Neck: Normal range of motion.  Respiratory: Effort normal.   Musculoskeletal: Normal range of motion.  Neurological: He is alert and oriented to person, place, and time.    Review of Systems  Constitutional: Negative.   HENT: Negative.   Eyes: Negative.   Respiratory: Negative.   Cardiovascular: Negative.   Gastrointestinal: Negative.   Genitourinary: Negative.   Musculoskeletal: Positive for myalgias, back pain and joint pain.  Skin: Positive for rash (Noted to have erythema around his nose, cheeks and forehead, more noticable today. ).  Neurological: Negative.   Endo/Heme/Allergies: Negative.   Psychiatric/Behavioral: Positive for depression, suicidal ideas and substance abuse. Negative for hallucinations and memory loss. The patient is nervous/anxious. The patient does not have insomnia.   All other systems reviewed and are negative.  reports some ongoing " heart burn ", denies vomiting , denies melenas .  Blood pressure 128/79, pulse 98, temperature 97.9 F (36.6 C), temperature source Oral, resp. rate 17, height 5\' 9"  (1.753 m), weight 91.627 kg (202 lb).Body mass index is 29.82 kg/(m^2).  General Appearance: Casual  Eye Contact::  Good  Speech:  Clear and Coherent  Volume:  Normal  Mood:  Depressed  Affect: Feels hopeless, but more reactive   Thought Process:  Circumstantial and Linear  Orientation:  Full (Time, Place, and Person)  Thought Content:  Rumination- denies hallucinations , no delusions, not internally preoccupied at this time - ruminative about being homeless at this time  Suicidal Thoughts:  Yes.  without intent/plan- describes passive SI, states would not feel safe for discharge at this time, but denies any current plan or intention of hurting self or of SI, and contracts for safety on the unit.   Homicidal Thoughts:  No  Memory:  Immediate;   Good Recent;   Good Remote;   Good  Judgement:  Fair  Insight:  Fair  Psychomotor Activity:  Normal  Concentration:  Fair  Recall:  Good  Fund of Knowledge:Good  Language:  Good  Akathisia:  No  Handed:  Right  AIMS (if indicated):     Assets:  Communication Skills Desire for Improvement Leisure Time Resilience  ADL's:  Intact  Cognition: WNL  Sleep:  Number of Hours: 5.75   Current Medications: Current Facility-Administered Medications  Medication Dose Route Frequency Provider Last Rate Last Dose  . acetaminophen (TYLENOL) tablet 650 mg  650 mg Oral Q4H PRN Earney Navy, NP   650 mg at 12/10/14 2034  . alum & mag hydroxide-simeth (MAALOX/MYLANTA) 200-200-20 MG/5ML suspension 30 mL  30 mL Oral PRN Earney Navy, NP   30 mL at 12/11/14 1445  . amphetamine-dextroamphetamine (ADDERALL) tablet 30 mg  30 mg Oral BID WC Sanjuana Kava, NP   30 mg at 12/16/14 1246  . buPROPion (WELLBUTRIN XL) 24 hr tablet 150 mg  150 mg Oral Daily Thermon Leyland, NP   150 mg at 12/16/14 0846  . clonazePAM (KLONOPIN) tablet 0.5 mg  0.5 mg  Oral TID PRN Shuvon B Rankin, NP   0.5 mg at 12/16/14 1317  . diclofenac sodium (VOLTAREN) 1 % transdermal gel 2 g  2 g Topical QID Shuvon B Rankin, NP   2 g at 12/14/14 0845  . dolutegravir (TIVICAY) tablet 50 mg  50 mg Oral Daily Earney Navy, NP   50 mg at 12/16/14 0846  . emtricitabine-tenofovir (TRUVADA) 200-300 MG per tablet 1 tablet  1 tablet Oral Daily Earney Navy, NP   1 tablet at 12/16/14 0846  . gabapentin (NEURONTIN) capsule 1,200 mg  1,200 mg Oral TID Earney Navy, NP   1,200 mg at 12/16/14 1247  . ibuprofen (ADVIL,MOTRIN) tablet 600 mg  600 mg Oral Q6H PRN Kizzie Fantasia, NP   600 mg at 12/15/14 2102  . loratadine (CLARITIN) tablet 10 mg  10 mg Oral Daily Sanjuana Kava, NP   10 mg at 12/16/14 0846  . metroNIDAZOLE (METROCREAM) 0.75 % cream   Topical BID Thermon Leyland, NP      . nicotine (NICODERM CQ - dosed in mg/24 hours) patch 21 mg  21 mg Transdermal Daily Earney Navy, NP   21 mg at 12/16/14 1316  . ondansetron (ZOFRAN) tablet 4 mg  4 mg Oral Q8H PRN Earney Navy, NP      . pantoprazole  (PROTONIX) EC tablet 20 mg  20 mg Oral Daily Craige Cotta, MD   20 mg at 12/16/14 0846  . QUEtiapine (SEROQUEL) tablet 25 mg  25 mg Oral QHS Thermon Leyland, NP      . sucralfate (CARAFATE) tablet 1 g  1 g Oral TID WC & HS Craige Cotta, MD   1 g at 12/16/14 1247  . traZODone (DESYREL) tablet 200 mg  200 mg Oral QHS PRN Rachael Fee, MD   200 mg at 12/11/14 2107    Lab Results:  No results found for this or any previous visit (from the past 48 hour(s)).  Physical Findings: AIMS: Facial and Oral Movements Muscles of Facial Expression: None, normal Lips and Perioral Area: None, normal Jaw: None, normal Tongue: None, normal,Extremity Movements Upper (arms, wrists, hands, fingers): None, normal Lower (legs, knees, ankles, toes): None, normal, Trunk Movements Neck, shoulders, hips: None, normal, Overall Severity Severity of abnormal movements (highest score from questions above): None, normal Incapacitation due to abnormal movements: None, normal Patient's awareness of abnormal movements (rate only patient's report): No Awareness, Dental Status Current problems with teeth and/or dentures?: No Does patient usually wear dentures?: No  CIWA:    COWS:       Treatment Plan Summary: Daily contact with patient to assess and evaluate symptoms and progress in treatment and Medication management  Daily contact with patient to assess and evaluate symptoms and progress in treatment and Medication management Supportive approach/coping skills Continue  Adderall 30 mgrs BID dosing time to 0600 am & 1300 due to complain of insomnia. Continue Wellbutrin XL 150 mg for depression. Decrease Seroquel  25  mgrs QHS  for anxiety and insomnia  Continue Neurontin 1200 mg TID for anxiety and pain management Continue Klonopin 0.5 mgrs TID PRN severe anxiety Continue antiretroviral medications to address HIV infections. Continue Protonix and add Carafate to address upper GI symptoms/ GERD symptoms.  SW to  help patient look at other disposition options. Start metrocream 0.75 % for rosacea.   Continue with current treatment plan.    Medical Decision Making:  Established Problem, Stable/Improving (1), Review of  Psycho-Social Stressors (1), Review of Last Therapy Session (1), Review of Medication Regimen & Side Effects (2) and Review of New Medication or Change in Dosage (2)  DAVIS, LAURA, NP-C 12/16/2014, 2:19 PM  I reviewed chart and agreed with the findings and treatment Plan.  Kathryne Sharper, MD

## 2014-12-16 NOTE — Plan of Care (Signed)
Problem: Alteration in mood Goal: STG-Patient reports thoughts of self-harm to staff Outcome: Progressing Patient reports thoughts of SI but is able to contract for safety.

## 2014-12-16 NOTE — [facility] Group Notes (Signed)
[facility] Group Notes:  (Clinical Social Work)  12/16/2014  1:15-2:15PM  Summary of Progress/Problems:   The main focus of today's process group was to   1)  discuss the importance of adding supports  2)  define health supports versus unhealthy supports  3)  identify the patient's current unhealthy supports and plan how to handle them  4)  Identify the patient's current healthy supports and plan what to add.  An emphasis was placed on using counselor, doctor, therapy groups, 12-step groups, and problem-specific support groups to expand supports.    The patient expressed little in group, said he could not participate because of feeling too bad, eventually left the group.  Type of Therapy:  Process Group with Motivational Interviewing  Participation Level:  None  Participation Quality:  Attentive  Affect:  Blunted and Depressed  Cognitive:  Alert  Insight:  Limited  Engagement in Therapy:  None  Modes of Intervention:   Education, Support and Processing, Activity  Ambrose Mantle, LCSW 12/16/2014

## 2014-12-16 NOTE — [facility] Group Notes (Signed)
[facility] Group Notes:  (Nursing/MHT/Case Management/Adjunct)  Date:  12/16/2014  Time:  12:54 PM  Type of Therapy:  Nurse Education  Participation Level:  Did Not Attend  Participation Quality:  did not attend  Affect:  did not attend  Cognitive:  did not attend  Insight:  None  Engagement in Group:  None  Modes of Intervention:  Discussion and Education  Summary of Progress/Problems: The purpose of this group is to discuss the topic of the day which is Healthy [employer]. Steps to making a change was also discussed. Patient was invited to group but did not attend.   Marzetta Board E 12/16/2014, 12:54 PM

## 2014-12-16 NOTE — Progress Notes (Signed)
Patient ID: Mason Meadows, male   DOB: [birth year], 39 y.o.   MRN: [id number]  DAR: Pt. Denies HI and A/V Hallucinations. He continues to endorse passive SI and is able to contract for safety. He reports his sleep was poor, appetite is poor, energy level is low, and concentration level is low. He continues to rate his depression, anxiety, and hopelessness at 10/10. Support and encouragement provided to the patient. Scheduled and PRN  medications administered to patient per physician's orders. Patient is receptive and cooperative. He is mostly seen in bed throughout the early day but as the day went on he is seen more in the milieu. Q15 minute checks are maintained for safety.

## 2014-12-17 NOTE — Progress Notes (Signed)
D:  Passive SI-contracts for safety Pt denies HI/AVH. Pt is pleasant and cooperative. Pt is irritable at times. Pt stated he was anxious, but was seen laughing and joking on the unit. Pt blames his situation on a set of circumstances that happened earlier. Pt appears to have no insight into his Tx  A: Pt was offered support and encouragement. Pt was given scheduled medications. Pt was encourage to attend groups. Q 15 minute checks were done for safety.   R:Pt attends groups and interacts well with peers and staff. Pt is taking medication .Pt receptive to treatment and safety maintained on unit.

## 2014-12-17 NOTE — Plan of Care (Signed)
Problem: Diagnosis: Increased Risk For Suicide Attempt Goal: LTG-Patient Will Report Improved Mood and Deny Suicidal LTG (by discharge) Patient will report improved mood and deny suicidal ideation.  Outcome: Not Progressing Pt passive SI-contacts for safety  Problem: Alteration in mood Goal: LTG-Patient reports reduction in suicidal thoughts (Patient reports reduction in suicidal thoughts and is able to verbalize a safety plan for whenever patient is feeling suicidal)  Outcome: Not Progressing Pt passive SI-contracts for safety  Problem: Alteration in mood; excessive anxiety as evidenced by: Goal: LTG-Patient's behavior demonstrates decreased anxiety (Patient's behavior demonstrates anxiety and he/she is utilizing learned coping skills to deal with anxiety-producing situations)  Outcome: Progressing Pt observed laughing and joking with peers and appears relaxed on unit

## 2014-12-17 NOTE — Progress Notes (Signed)
D- Patient reports that he is depressed and endorses SI without a plan.  Verbally contracts for safety.  Patient's behavior and demeanor do not coincide with patient's self report.  Patient appears anxious with a bright affect and is observed laughing and joking with others on the unit including staff members.  Denies HI and AVH.  On patient's self-inventory sheet, he rated his depression a "9" and feelings of hopelessness and anxiety both an "8" out of 10 with 10 being the worst.  Patient continues to ask for medication increases stating that his current doses is not effective.  Patient continues to refuse his Voltaren and Metrogel stating "I don't need that". A- Scheduled medications administered to patient, per MD orders. Support and encouragement provided.  Routine safety checks conducted every 15 minutes.  Patient informed to notify staff with problems or concerns. R- No adverse drug reactions noted.  Patient compliant with medications and treatment plan except for the prescribed topical creams. Patient interacts well with others on the unit.  Patient remains safe at this time.

## 2014-12-17 NOTE — Progress Notes (Signed)
Recreation Therapy Notes  Date: 08.01.16 Time: 9:30 am Location: [address] Group Room  Group Topic: Stress Management  Goal Area(s) Addresses:  Patient will verbalize importance of using healthy stress management.  Patient will identify positive emotions associated with healthy stress management.   Intervention: Stress Management  Activity :  Guided Imagery Script.  LRT introduced and educated patients on the stress management technique of guided imagery script.  A script was read and patients were asked to follow a long as the LRT read the script a loud to engaged in the technique of guided imagery.  Education:  Stress Management, Discharge Planning.   Education Outcome: Acknowledges edcuation/In group clarification offered/Needs additional education  Clinical Observations/Feedback: Patient did not attend group.   Juliane Guest, LRT/CTRS         Leonell Lobdell A 12/17/2014 1:21 PM 

## 2014-12-17 NOTE — Progress Notes (Signed)
[facility] Group Notes:  (Nursing/MHT/Case Management/Adjunct)  Date:  12/17/2014  Time:  9:55 PM  Type of Therapy:  Psychoeducational Skills  Participation Level:  Active  Participation Quality:  Appropriate  Affect:  Appropriate  Cognitive:  Appropriate  Insight:  Appropriate  Engagement in Group:  Engaged  Modes of Intervention:  Discussion  Summary of Progress/Problems:  Tonight in wrap up group Thayer Ohm said that his day was a  4 he is still trying to deal with his situation but being around his hall mates have made his stay here a little easier to deal with.  [name] 12/17/2014, 9:55 PM

## 2014-12-17 NOTE — [facility] Group Notes (Signed)
[facility] LCSW Group Therapy  12/17/2014 1:15pm  Type of Therapy:  Group Therapy vercoming Obstacles  Participation Level:  Active  Participation Quality:  Appropriate   Affect:  Appropriate  Cognitive:  Appropriate and Oriented  Insight:  Developing/Improving and Improving  Engagement in Therapy:  Improving  Modes of Intervention:  Discussion, Exploration, Problem-solving and Support  Description of Group:   In this group patients will be encouraged to explore what they see as obstacles to their own wellness and recovery. They will be guided to discuss their thoughts, feelings, and behaviors related to these obstacles. The group will process together ways to cope with barriers, with attention given to specific choices patients can make. Each patient will be challenged to identify changes they are motivated to make in order to overcome their obstacles. This group will be process-oriented, with patients participating in exploration of their own experiences as well as giving and receiving support and challenge from other group members.  Summary of Patient Progress: Pt continues to be monopolizing in groups but participation is appropriate. He expresses that depression is an obstacle in his life along with finding housing. Pt identified feelings of being overwhelmed when faced with obstacles and reflected that he avoids them at times. Pt is encouraging to others in the group and challenges them appropriately as well.    Therapeutic Modalities:   Cognitive Behavioral Therapy Solution Focused Therapy Motivational Interviewing Relapse Prevention Therapy   Chad Cordial, LCSWA 12/17/2014 3:59 PM

## 2014-12-17 NOTE — Progress Notes (Signed)
D: Patient is alert and oriented x 4. Patient denies pain, AVH/HI. Patient states he is having passive SI without a plan. Patient stated he is hoping he can go back to shelter in [place], [place]. This nurse encouraged patient to stay positive and hopefully a bed will become available by the time he is ready to discharged. Earlier in shift patient was seen in another patient's bathroom with peer. Staff advised patient that is not allowed, and not to do that again. Patient verbally agreed. At 2138 patient requested Trazodone to help him sleep. PRN was given with effective results. Patient did refuse Voltaren Gel and Seroquel. Patient stated "The Seroquel makes me to to sleepy in the morning. A: Staff to monitor Q 15 mins for safety. Encouragement and support offered. Scheduled medications administered per orders. R: Patient remains safe on the unit. Patient attended group tonight. Patient visible on hte unit and interacting with peers. Patient taking administered medications.

## 2014-12-17 NOTE — Progress Notes (Addendum)
Patient ID: Mason Meadows, male   DOB: Jun 10, 1975, 39 y.o.   MRN: [id number] Patient ID: Mason Meadows, male   DOB: Feb 08, 1976, 39 y.o.   MRN: [id number] [facility] MD Progress Note  12/17/2014 4:21 PM Mason Meadows  MRN:  [id number]   Subjective:  Mason Meadows says, "I'm feeling very depressed, sad, hopeless. I have bad nightmares. Mood swings. My medicines are not helping me".  Objective:  Patient seen and reviewed. Mason Meadows says he feels very nervous about this coming Wednesday. States he does not know if he is going to be accepted back at the [facility] in [place]. He says he lived there for 2 months prior to being arrested for stealing beer. He is ruminating about getting old and has no one to share his life with. He requested for his Wellbutrin to be increased to 300 mg. He was explained that he was put on Wellbutrin XL on Saturday at 150 mg daily. That was just 2 days ago. That it is too early to determine it's effectiveness.    Principal Problem: Bipolar I disorder, most recent episode depressed Diagnosis:   Patient Active Problem List   Diagnosis Date Noted  . Alcohol abuse [F10.10] 12/07/2014  . Bipolar I disorder, most recent episode depressed [F31.30] 12/07/2014  . Mood disorder [F39] 12/06/2014  . Thrush [B37.0]   . 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 Noncompliance [Z91.19]   . Neutropenic fever [D70.9] 08/31/2014  . Fever [R50.9] 08/31/2014  . Chest pain [R07.9] 08/31/2014  . Pancytopenia [D61.818] 08/31/2014  . HIV (human immunodeficiency virus infection) [Z21] 08/31/2014  . Acute respiratory failure with hypoxia [J96.01] 05/31/2013  . Presumed Pneumocystosis pneumonia [B59] 05/31/2013  . Severe protein-calorie malnutrition [E43] 05/31/2013  . AIDS [B20] 05/31/2013   Total Time spent with patient: 20 minutes  Past Medical History:  Past Medical History  Diagnosis Date  . Pneumonia   . Anxiety   . AIDS (acquired immune deficiency syndrome)    . Depression   . Mood disorder 12/06/2014    Past Surgical History  Procedure Laterality Date  . Back surgery    . Total hip arthroplasty     Family History:  Family History  Problem Relation Age of Onset  . Hypertension Mother   . Diabetes Mellitus II Mother   . Hypertension Father   . Diabetes Mellitus II Father    Social History:  History  Alcohol Use No     History  Drug Use No    Comment: last time -2 months ago    History   Social History  . Marital Status: Single    Spouse Name: N/A  . Number of Children: N/A  . Years of Education: N/A   Social History Main Topics  . Smoking status: Current Every Day Smoker -- 1.00 packs/day for 10 years    Types: Cigarettes  . Smokeless tobacco: Never Used  . Alcohol Use: No  . Drug Use: No     Comment: last time -2 months ago  . Sexual Activity: Yes     Comment: Hasn't smoked   Other Topics Concern  . None   Social History Narrative   Additional History:    Sleep: improved   Appetite:  Improved   Assessment: Today Mason Meadows continues to report ongoing & worsening depression & SI due to multiple stressors related to homelessness and loss of bed at his shelter in [place] area. Denies current plan or intention of hurting self.  Musculoskeletal: Strength &  Muscle Tone: within normal limits Gait & Station: normal Patient leans: N/A  Psychiatric Specialty Exam: Physical Exam  Nursing note and vitals reviewed. Constitutional: He is oriented to person, place, and time.  Neck: Normal range of motion.  Respiratory: Effort normal.  Musculoskeletal: Normal range of motion.  Neurological: He is alert and oriented to person, place, and time.    Review of Systems  Constitutional: Negative.   HENT: Negative.   Eyes: Negative.   Respiratory: Negative.   Cardiovascular: Negative.   Gastrointestinal: Negative.   Genitourinary: Negative.   Musculoskeletal: Positive for myalgias, back pain and joint pain.   Skin: Positive for rash (Noted to have erythema around his nose, cheeks and forehead, more noticable today. ).  Neurological: Negative.   Endo/Heme/Allergies: Negative.   Psychiatric/Behavioral: Positive for depression, suicidal ideas and substance abuse. Negative for hallucinations and memory loss. The patient is nervous/anxious. The patient does not have insomnia.   All other systems reviewed and are negative.  reports some ongoing " heart burn ", denies vomiting , denies melenas .  Blood pressure 128/85, pulse 114, temperature 97.9 F (36.6 C), temperature source Oral, resp. rate 18, height 5\' 9"  (1.753 m), weight 91.627 kg (202 lb).Body mass index is 29.82 kg/(m^2).  General Appearance: Casual  Eye Contact::  Good  Speech:  Clear and Coherent  Volume:  Normal  Mood:  Depressed  Affect: Feels hopeless, but more reactive   Thought Process:  Circumstantial and Linear  Orientation:  Full (Time, Place, and Person)  Thought Content:  Rumination- denies hallucinations , no delusions, not internally preoccupied at this time - ruminative about being homeless at this time  Suicidal Thoughts:  Yes.  without intent/plan- describes passive SI, states would not feel safe for discharge at this time, but denies any current plan or intention of hurting self or of SI, and contracts for safety on the unit.   Homicidal Thoughts:  No  Memory:  Immediate;   Good Recent;   Good Remote;   Good  Judgement:  Fair  Insight:  Fair  Psychomotor Activity:  Normal  Concentration:  Fair  Recall:  Good  Fund of Knowledge:Good  Language: Good  Akathisia:  No  Handed:  Right  AIMS (if indicated):     Assets:  Communication Skills Desire for Improvement Leisure Time Resilience  ADL's:  Intact  Cognition: WNL  Sleep:  Number of Hours: 5.75   Current Medications: Current Facility-Administered Medications  Medication Dose Route Frequency Provider Last Rate Last Dose  . acetaminophen (TYLENOL) tablet 650 mg   650 mg Oral Q4H PRN Earney Navy, NP   650 mg at 12/10/14 2034  . alum & mag hydroxide-simeth (MAALOX/MYLANTA) 200-200-20 MG/5ML suspension 30 mL  30 mL Oral PRN Earney Navy, NP   30 mL at 12/11/14 1445  . amphetamine-dextroamphetamine (ADDERALL) tablet 30 mg  30 mg Oral BID WC Sanjuana Kava, NP   30 mg at 12/17/14 1203  . buPROPion (WELLBUTRIN XL) 24 hr tablet 150 mg  150 mg Oral Daily Thermon Leyland, NP   150 mg at 12/17/14 0737  . clonazePAM (KLONOPIN) tablet 0.5 mg  0.5 mg Oral TID PRN Shuvon B Rankin, NP   0.5 mg at 12/17/14 1204  . diclofenac sodium (VOLTAREN) 1 % transdermal gel 2 g  2 g Topical QID Shuvon B Rankin, NP   2 g at 12/14/14 0845  . dolutegravir (TIVICAY) tablet 50 mg  50 mg Oral Daily Earney Navy,  NP   50 mg at 12/17/14 0737  . emtricitabine-tenofovir (TRUVADA) 200-300 MG per tablet 1 tablet  1 tablet Oral Daily Earney Navy, NP   1 tablet at 12/17/14 0737  . gabapentin (NEURONTIN) capsule 1,200 mg  1,200 mg Oral TID Earney Navy, NP   1,200 mg at 12/17/14 1155  . ibuprofen (ADVIL,MOTRIN) tablet 600 mg  600 mg Oral Q6H PRN Kizzie Fantasia, NP   600 mg at 12/15/14 2102  . loratadine (CLARITIN) tablet 10 mg  10 mg Oral Daily Sanjuana Kava, NP   10 mg at 12/17/14 0737  . metroNIDAZOLE (METROGEL) 0.75 % gel   Topical BID Rachael Fee, MD      . nicotine (NICODERM CQ - dosed in mg/24 hours) patch 21 mg  21 mg Transdermal Daily Earney Navy, NP   21 mg at 12/17/14 0742  . ondansetron (ZOFRAN) tablet 4 mg  4 mg Oral Q8H PRN Earney Navy, NP      . pantoprazole (PROTONIX) EC tablet 20 mg  20 mg Oral Daily Craige Cotta, MD   20 mg at 12/17/14 0737  . QUEtiapine (SEROQUEL) tablet 25 mg  25 mg Oral QHS Thermon Leyland, NP   25 mg at 12/16/14 2313  . sucralfate (CARAFATE) tablet 1 g  1 g Oral TID WC & HS Rockey Situ Cobos, MD   1 g at 12/17/14 1155  . traZODone (DESYREL) tablet 200 mg  200 mg Oral QHS PRN Rachael Fee, MD   200 mg at 12/16/14  2128   Lab Results:  No results found for this or any previous visit (from the past 48 hour(s)).  Physical Findings: AIMS: Facial and Oral Movements Muscles of Facial Expression: None, normal Lips and Perioral Area: None, normal Jaw: None, normal Tongue: None, normal,Extremity Movements Upper (arms, wrists, hands, fingers): None, normal Lower (legs, knees, ankles, toes): None, normal, Trunk Movements Neck, shoulders, hips: None, normal, Overall Severity Severity of abnormal movements (highest score from questions above): None, normal Incapacitation due to abnormal movements: None, normal Patient's awareness of abnormal movements (rate only patient's report): No Awareness, Dental Status Current problems with teeth and/or dentures?: No Does patient usually wear dentures?: No  CIWA:    COWS:       Treatment Plan Summary: Daily contact with patient to assess and evaluate symptoms and progress in treatment and Medication management  Daily contact with patient to assess and evaluate symptoms and progress in treatment and Medication management Supportive approach/coping skills Continue  Adderall 30 mgrs BID dosing time to 0600 am & 1300 due to complain of insomnia. Continue Wellbutrin XL 150 mg for depression. Decrease Seroquel  25  mgrs QHS  for anxiety and insomnia  Continue Neurontin 1200 mg TID for anxiety and pain management Continue Klonopin 0.5 mgrs TID PRN severe anxiety Continue antiretroviral medications to address HIV infections. Continue Protonix and add Carafate to address upper GI symptoms/ GERD symptoms.  SW to help patient look at other disposition options. Start metrocream 0.75 % for rosacea.   Continue with current treatment plan.    Medical Decision Making:  Established Problem, Stable/Improving (1), Review of Psycho-Social Stressors (1), Review of Last Therapy Session (1), Review of Medication Regimen & Side Effects (2) and Review of New Medication or Change in  Dosage (2)  Sanjuana Kava, FNP, PMHNP-BC 12/17/2014, 4:21 PM I agree with assessment and plan Madie Reno A. Dub Mikes, M.D.

## 2014-12-17 NOTE — [facility] Group Notes (Signed)
[facility] LCSW Aftercare Discharge Planning Group Note  12/17/2014 8:45 AM  Pt did not attend, declined invitation.   Chad Cordial, LCSWA 12/17/2014 9:30 AM

## 2014-12-18 MED ORDER — BUPROPION HCL ER (XL) 300 MG PO TB24
300.0000 mg | ORAL_TABLET | Freq: Every day | ORAL | Status: DC
  Administered 2014-12-19: 300 mg via ORAL
  Filled 2014-12-18 (×2): qty 1
  Filled 2014-12-18: qty 14

## 2014-12-18 MED ORDER — BUPROPION HCL ER (XL) 150 MG PO TB24
150.0000 mg | ORAL_TABLET | Freq: Once | ORAL | Status: AC
  Administered 2014-12-18: 150 mg via ORAL
  Filled 2014-12-18: qty 1

## 2014-12-18 NOTE — Progress Notes (Signed)
D:  Per pt self inventory pt reports sleeping fair, appetite good, energy level low, ability to pay attention poor, rates depression at a 9 out of 10, hopelessness at a 9 out of 10, anxiety at a 7 out of 10, endorses SI all the time, no plan, contracts for safey, denies HI/AVH, animated/silly during interaction, goal today: "positive thinking, slow my thinking down"  A:  Emotional support provided, Encouraged pt to continue with treatment plan and attend all group activities, q15 min checks maintained for safety.  R:  Pt is receptive, going to groups, does not want to be discharged--doesn't feel like he is ready, participates appropriately, pleasant and cooperative with staff and other patients on the unit.

## 2014-12-18 NOTE — Plan of Care (Signed)
Problem: Ineffective individual coping Goal: LTG: Patient will report a decrease in negative feelings Outcome: Progressing Pt stated he was doing great  Problem: Alteration in mood Goal: LTG-Patient reports reduction in suicidal thoughts (Patient reports reduction in suicidal thoughts and is able to verbalize a safety plan for whenever patient is feeling suicidal)  Outcome: Progressing Pt denies SI at this time

## 2014-12-18 NOTE — Progress Notes (Signed)
[facility] MD Progress Note  12/18/2014 7:16 PM Mason Meadows  MRN:  [id number] Subjective:  Mason Meadows states he thinks he needs an increase in the Wellbutrin. States he was switch to Celexa and he "went down" he is now back on Wellbutrin and is starting to feel better but would like to pursue the original plan of increasing the Wellbutrin to 300 mg. States he hopes to be able to go back to where he was living before all this happened. He wants to contact the MD that was helping him with his meds Principal Problem: Bipolar I disorder, most recent episode depressed Diagnosis:   Patient Active Problem List   Diagnosis Date Noted  . Alcohol abuse [F10.10] 12/07/2014  . Bipolar I disorder, most recent episode depressed [F31.30] 12/07/2014  . Mood disorder [F39] 12/06/2014  . Thrush [B37.0]   . 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 Noncompliance [Z91.19]   . Neutropenic fever [D70.9] 08/31/2014  . Fever [R50.9] 08/31/2014  . Chest pain [R07.9] 08/31/2014  . Pancytopenia [D61.818] 08/31/2014  . HIV (human immunodeficiency virus infection) [Z21] 08/31/2014  . Acute respiratory failure with hypoxia [J96.01] 05/31/2013  . Presumed Pneumocystosis pneumonia [B59] 05/31/2013  . Severe protein-calorie malnutrition [E43] 05/31/2013  . AIDS [B20] 05/31/2013   Total Time spent with patient: 30 minutes   Past Medical History:  Past Medical History  Diagnosis Date  . Pneumonia   . Anxiety   . AIDS (acquired immune deficiency syndrome)   . Depression   . Mood disorder 12/06/2014    Past Surgical History  Procedure Laterality Date  . Back surgery    . Total hip arthroplasty     Family History:  Family History  Problem Relation Age of Onset  . Hypertension Mother   . Diabetes Mellitus II Mother   . Hypertension Father   . Diabetes Mellitus II Father    Social History:  History  Alcohol Use No     History  Drug Use No    Comment: last time -2 months ago    History   Social  History  . Marital Status: Single    Spouse Name: N/A  . Number of Children: N/A  . Years of Education: N/A   Social History Main Topics  . Smoking status: Current Every Day Smoker -- 1.00 packs/day for 10 years    Types: Cigarettes  . Smokeless tobacco: Never Used  . Alcohol Use: No  . Drug Use: No     Comment: last time -2 months ago  . Sexual Activity: Yes     Comment: Hasn't smoked   Other Topics Concern  . None   Social History Narrative   Additional History:    Sleep: Fair  Appetite:  Fair   Assessment:   Musculoskeletal: Strength & Muscle Tone: within normal limits Gait & Station: normal Patient leans: normal   Psychiatric Specialty Exam: Physical Exam  Review of Systems  Constitutional: Negative.   HENT: Negative.   Eyes: Negative.   Respiratory: Negative.   Cardiovascular: Negative.   Gastrointestinal: Negative.   Genitourinary: Negative.   Musculoskeletal: Negative.   Skin: Negative.   Neurological: Negative.   Endo/Heme/Allergies: Negative.   Psychiatric/Behavioral: Positive for depression. The patient is nervous/anxious.     Blood pressure 119/82, pulse 97, temperature 97.6 F (36.4 C), temperature source Oral, resp. rate 18, height 5\' 9"  (1.753 m), weight 91.627 kg (202 lb).Body mass index is 29.82 kg/(m^2).  General Appearance: Fairly Groomed  Patent attorney::  Fair  Speech:  Clear and Coherent  Volume:  Normal  Mood:  Depressed  Affect:  Appropriate  Thought Process:  Coherent and Goal Directed  Orientation:  Full (Time, Place, and Person)  Thought Content:  symptoms events worries concerns  Suicidal Thoughts:  No  Homicidal Thoughts:  No  Memory:  Immediate;   Fair Recent;   Fair Remote;   Fair  Judgement:  Fair  Insight:  Present  Psychomotor Activity:  Restlessness  Concentration:  Fair  Recall:  [employer] of Knowledge:Fair  Language: Fair  Akathisia:  No  Handed:  Right  AIMS (if indicated):     Assets:  Desire for  Improvement  ADL's:  Intact  Cognition: WNL  Sleep:  Number of Hours: 5.75     Current Medications: Current Facility-Administered Medications  Medication Dose Route Frequency Provider Last Rate Last Dose  . acetaminophen (TYLENOL) tablet 650 mg  650 mg Oral Q4H PRN Earney Navy, NP   650 mg at 12/10/14 2034  . alum & mag hydroxide-simeth (MAALOX/MYLANTA) 200-200-20 MG/5ML suspension 30 mL  30 mL Oral PRN Earney Navy, NP   30 mL at 12/11/14 1445  . amphetamine-dextroamphetamine (ADDERALL) tablet 30 mg  30 mg Oral BID WC Sanjuana Kava, NP   30 mg at 12/18/14 1251  . [START ON 12/19/2014] buPROPion (WELLBUTRIN XL) 24 hr tablet 300 mg  300 mg Oral Daily Rachael Fee, MD      . clonazePAM [facility]) tablet 0.5 mg  0.5 mg Oral TID PRN Shuvon B Rankin, NP   0.5 mg at 12/18/14 1705  . dolutegravir (TIVICAY) tablet 50 mg  50 mg Oral Daily Earney Navy, NP   50 mg at 12/18/14 0803  . emtricitabine-tenofovir (TRUVADA) 200-300 MG per tablet 1 tablet  1 tablet Oral Daily Earney Navy, NP   1 tablet at 12/18/14 0803  . gabapentin (NEURONTIN) capsule 1,200 mg  1,200 mg Oral TID Earney Navy, NP   1,200 mg at 12/18/14 1705  . ibuprofen (ADVIL,MOTRIN) tablet 600 mg  600 mg Oral Q6H PRN Evanna Burkett, NP   600 mg at 12/18/14 1538  . loratadine (CLARITIN) tablet 10 mg  10 mg Oral Daily Sanjuana Kava, NP   10 mg at 12/18/14 0803  . metroNIDAZOLE (METROGEL) 0.75 % gel   Topical BID Rachael Fee, MD      . nicotine (NICODERM CQ - dosed in mg/24 hours) patch 21 mg  21 mg Transdermal Daily Earney Navy, NP   21 mg at 12/18/14 0805  . ondansetron (ZOFRAN) tablet 4 mg  4 mg Oral Q8H PRN Earney Navy, NP      . pantoprazole (PROTONIX) EC tablet 20 mg  20 mg Oral Daily Craige Cotta, MD   20 mg at 12/18/14 0803  . QUEtiapine (SEROQUEL) tablet 25 mg  25 mg Oral QHS Thermon Leyland, NP   25 mg at 12/16/14 2313  . sucralfate (CARAFATE) tablet 1 g  1 g Oral TID WC & HS Craige Cotta, MD   1 g at 12/18/14 1705  . traZODone (DESYREL) tablet 200 mg  200 mg Oral QHS PRN Rachael Fee, MD   200 mg at 12/17/14 2125    Lab Results: No results found for this or any previous visit (from the past 48 hour(s)).  Physical Findings: AIMS: Facial and Oral Movements Muscles of Facial Expression: None, normal Lips and Perioral Area: None, normal Jaw:  None, normal Tongue: None, normal,Extremity Movements Upper (arms, wrists, hands, fingers): None, normal Lower (legs, knees, ankles, toes): None, normal, Trunk Movements Neck, shoulders, hips: None, normal, Overall Severity Severity of abnormal movements (highest score from questions above): None, normal Incapacitation due to abnormal movements: None, normal Patient's awareness of abnormal movements (rate only patient's report): No Awareness, Dental Status Current problems with teeth and/or dentures?: No Does patient usually wear dentures?: No  CIWA:    COWS:     Treatment Plan Summary: Daily contact with patient to assess and evaluate symptoms and progress in treatment and Medication management Supportive approach/coping skills Depression: will go ahead and add a second dose of Wellbutrin today and start 300 mg tomorrow AM Will work with CBT/mindfulness Will reassess in the AM for a possible D/C   Medical Decision Making:  Review of Psycho-Social Stressors (1), Review of Medication Regimen & Side Effects (2) and Review of New Medication or Change in Dosage (2)     Allyn Bertoni A 12/18/2014, 7:16 PM

## 2014-12-18 NOTE — Progress Notes (Signed)
D: Pt denies SI/HI/AVH. Pt is pleasant and cooperative. Pt pt ready to go home. Pt stated he may be able to get back into [employer].   A: Pt was offered support and encouragement. Pt was given scheduled medications. Pt was encourage to attend groups. Q 15 minute checks were done for safety.   R:Pt attends groups and interacts well with peers and staff. Pt is taking medication. Pt has no complaints at this time .Pt receptive to treatment and safety maintained on unit.

## 2014-12-18 NOTE — Progress Notes (Signed)
Recreation Therapy Notes  Animal-Assisted Activity (AAA) Program Checklist/Progress Notes Patient Eligibility Criteria Checklist & Daily Group note for Rec Tx Intervention  Date: 08.02.16 Time: 02:45 pm Location: [address] Day[address]  AAA/T Program Assumption of Risk Form signed by Patient/ or Parent Legal Guardian yes  Patient is free of allergies or sever asthma yes  Patient reports no fear of animals yes  Patient reports no history of cruelty to animals yes  Patient understands his/her participation is voluntary yes  Patient washes hands before animal contact yes  Patient washes hands after animal contact yes  Behavioral Response:  Engaged  Education: Hand Washing, Appropriate Animal Interaction   Education Outcome: Acknowledges understanding/In group clarification offered/Needs additional education.   Clinical Observations/Feedback:  Patient attended group.   Mason Meadows, LRT/CTRS         Gaytha Raybourn A 12/18/2014 3:59 PM 

## 2014-12-18 NOTE — [facility] Group Notes (Signed)
[facility] LCSW Group Therapy  12/18/2014   1:15 PM   Type of Therapy:  Group Therapy  Participation Level:  Active  Participation Quality:  Attentive, Sharing and Supportive  Affect:  Appropriate  Cognitive:  Alert and Oriented  Insight:  Developing/Improving and Engaged  Engagement in Therapy:  Developing/Improving and Engaged  Modes of Intervention:  Clarification, Confrontation, Discussion, Education, Exploration, Limit-setting, Orientation, Problem-solving, Rapport Building, Dance movement psychotherapist, Socialization and Support  Summary of Progress/Problems: The topic for group therapy was feelings about diagnosis.  Pt actively participated in group discussion on their past and current diagnosis and how they feel towards this.  Pt also identified how society and family members judge them, based on their diagnosis as well as stereotypes and stigmas.  Patient reported feeling that people in his life don't understand his mood swings and reports feeling a lack of support and understanding from his family. Patient discussed how he has formed meaningful relationships with other group members during his hospitalization. CSW and other group members provided emotional support and encouragement.  Samuella Bruin, MSW, [employer] Clinical Social Worker [facility] [phone]

## 2014-12-18 NOTE — [facility] Group Notes (Signed)
Adult Psychoeducational Group Note  Date:  12/18/2014 Time:  0900am  Group Topic/Focus:  Goals Group:   The focus of this group is to help patients establish daily goals to achieve during treatment and discuss how the patient can incorporate goal setting into their daily lives to aide in recovery. Orientation:   The focus of this group is to educate the patient on the purpose and policies of crisis stabilization and provide a format to answer questions about their admission.  The group details unit policies and expectations of patients while admitted.  Participation Level:  Active  Participation Quality:  Appropriate, Attentive and Sharing  Affect:  Appropriate  Cognitive:  Alert and Appropriate  Insight: Appropriate and Good  Engagement in Group:  Engaged  Modes of Intervention:  Discussion, Education and Support  Lauris Poag 12/18/2014, 9:25 AM

## 2014-12-18 NOTE — Tx Team (Addendum)
Interdisciplinary Treatment Plan Update (Adult) Date: 12/18/2014    Time Reviewed: 9:30 AM  Progress in Treatment: Attending groups: Yes Participating in groups: Yes Taking medication as prescribed: Yes Tolerating medication: Yes Family/Significant other contact made: No, Pt declines family contact Patient understands diagnosis: Yes Discussing patient identified problems/goals with staff: Yes Medical problems stabilized or resolved: Yes Denies suicidal/homicidal ideation: Yes Issues/concerns per patient self-inventory: Yes Other:  New problem(s) identified: N/A  Discharge Plan or Barriers: 12/07/2014:  CSW continuing to assess, patient new to milieu. 12/11/2014: Continuing to assess. Pt wants to return to the [facility] area. 12/13/14: Pt would like to return to shelter in [facility] and continue with his opt providers at the [facility] ID clinic 8/2: Patient is uncertain if he can return to shelter in [facility] and is uncertain of living arrangements at discharge.  Reason for Continuation of Hospitalization:  Depression Anxiety Medication Stabilization   Comments: N/A  Estimated length of stay: 1-2 days   Patient is a 39 year old male admitted for depression and anxiety following his release from jail. Patient was living in a recovery house in [place] prior to being arrested. Patient will benefit from crisis stabilization, medication evaluation, group therapy, and psycho education in addition to case management for discharge planning. Patient and CSW reviewed pt's identified goals and treatment plan. Pt verbalized understanding and agreed to treatment plan.     Review of initial/current patient goals per problem list:  1. Goal(s): Patient will participate in aftercare plan   Met: No   Target date: 3-5 days   As evidenced by: Patient will participate within aftercare plan AEB aftercare provider and housing plan at discharge being identified.  7/22: Goal not met:  CSW assessing for appropriate referrals for pt and will have follow up secured prior to d/c. 7/26: Pt continues to be unsure about his discharge plan. If he is unable to return to the [facility] in [place], [place] will also need a new provider. 7/28: Pt will return to [facility] and follow-up with pre-established providers. 8/2: Patient unsure if he can return to previous living situation in [facility] and is unsure of his plans at discharge.   2. Goal (s): Patient will exhibit decreased depressive symptoms and suicidal ideations.   Met: No   Target date: 3-5 days   As evidenced by: Patient will utilize self rating of depression at 3 or below and demonstrate decreased signs of depression or be deemed stable for discharge by MD.  7/22: Goal not met: Pt presents with flat affect and depressed mood.  Pt admitted with depression rating of 10.  Pt to show decreased sign of depression and a rating of 3 or less before d/c.   7/26: Pt continues to rate depression at 6/10, however presents with bright affect around peers. Pt also endorses passive SI. 7/28: Goal not met. Pt continues to express feelings of depression and exhibits symptoms of the same.  8/2: Goal progressing. Patient's self-report of depressive symptoms and SI are inconsistent with his affect and behavior on the milieu.    3. Goal(s): Patient will demonstrate decreased signs and symptoms of anxiety.   Met: No   Target date: 3-5 days   As evidenced by: Patient will utilize self rating of anxiety at 3 or below and demonstrated decreased signs of anxiety, or be deemed stable for discharge by MD  7/22: Goal not met: Pt presents with anxious mood and affect.  Pt admitted with  anxiety rating of 10.  Pt to show decreased sign of anxiety and a rating of 3 or less before d/c. 7/26: Pt continues to rate anxiety highly, approximately 8/10. 12/13/2014: Goal not met. Pt continues to express feelings of anxiety and exhibits  symptoms of the same.  8/2: Goal progressing. Patient's self-report of anxiety symptoms are inconsistent with his affect and behavior on the milieu.    4. Goal(s): Patient will demonstrate decreased signs of withdrawal due to substance abuse   Met: Yes   Target date: 3-5 day   As evidenced by: Patient will produce a CIWA/COWS score of 0, have stable vitals signs, and no symptoms of withdrawal  7/22: Goal met: No withdrawal symptoms reported at this time per medical chart.    Attendees: Patient:    Family:    Physician:  Dr. Sabra Heck 12/18/2014 9:30 AM  Nursing:  Markham Jordan, Darrol Angel ,RN 12/18/2014 9:30 AM  Clinical Social Worker: Tilden Fossa,  [place] 12/18/2014 9:30 AM  Other: Peri Maris, LCSWA  12/18/2014 9:30 AM  Other: Lucinda Dell, Beverly Sessions Liaison 12/18/2014 9:30 AM  Other: Lars Pinks, Case Manager 12/18/2014 9:30 AM  Other: Ave Filter , NP 12/18/2014 9:30 AM  Other:    Other:        Scribe for Treatment Team:  Tilden Fossa, MSW, [place] [phone]

## 2014-12-18 NOTE — Progress Notes (Signed)
Patient ID: Mason Meadows, male   DOB: 10/21/1975, 39 y.o.   MRN: [id number] PER STATE REGULATIONS 482.30  THIS CHART WAS REVIEWED FOR MEDICAL NECESSITY WITH RESPECT TO THE PATIENT'S ADMISSION/ DURATION OF STAY.  NEXT REVIEW DATE: 12/22/2014  Willa Rough, RN, BSN CASE MANAGER

## 2014-12-19 ENCOUNTER — Encounter (HOSPITAL_COMMUNITY): Payer: Self-pay | Admitting: Registered Nurse

## 2014-12-19 MED ORDER — GABAPENTIN 600 MG PO TABS
1200.0000 mg | ORAL_TABLET | Freq: Three times a day (TID) | ORAL | Status: DC
  Filled 2014-12-19: qty 2
  Filled 2014-12-19 (×3): qty 84

## 2014-12-19 MED ORDER — SUCRALFATE 1 G PO TABS: 1.0000 g | ORAL_TABLET | Freq: Three times a day (TID) | ORAL | Status: AC

## 2014-12-19 MED ORDER — METRONIDAZOLE 0.75 % EX GEL: Freq: Two times a day (BID) | CUTANEOUS | Status: AC

## 2014-12-19 MED ORDER — BUPROPION HCL ER (XL) 300 MG PO TB24: 300.0000 mg | ORAL_TABLET | Freq: Every day | ORAL | Status: AC

## 2014-12-19 MED ORDER — CLONAZEPAM 0.5 MG PO TABS: 0.5000 mg | ORAL_TABLET | Freq: Three times a day (TID) | ORAL | Status: AC | PRN

## 2014-12-19 MED ORDER — PANTOPRAZOLE SODIUM 20 MG PO TBEC: 20.0000 mg | DELAYED_RELEASE_TABLET | Freq: Every day | ORAL | Status: AC

## 2014-12-19 MED ORDER — AMPHETAMINE-DEXTROAMPHETAMINE 30 MG PO TABS: 30.0000 mg | ORAL_TABLET | Freq: Two times a day (BID) | ORAL | Status: AC

## 2014-12-19 MED ORDER — GABAPENTIN 600 MG PO TABS: 1200.0000 mg | ORAL_TABLET | Freq: Three times a day (TID) | ORAL | Status: DC

## 2014-12-19 MED ORDER — QUETIAPINE FUMARATE 25 MG PO TABS: 25.0000 mg | ORAL_TABLET | Freq: Every day | ORAL | Status: AC

## 2014-12-19 MED ORDER — TRAZODONE HCL 100 MG PO TABS: 200.0000 mg | ORAL_TABLET | Freq: Every evening | ORAL | Status: AC | PRN

## 2014-12-19 MED ORDER — GABAPENTIN 600 MG PO TABS: 1200.0000 mg | ORAL_TABLET | Freq: Three times a day (TID) | ORAL | Status: AC

## 2014-12-19 NOTE — Discharge Summary (Signed)
Physician Discharge Summary Note  Patient:  Mason Meadows is an 39 y.o., male MRN:  [id number] DOB:  1975/11/07 Patient phone:  [phone] (home)  Patient address:   [address] [place]  [place] [address],  Total Time spent with patient: 45 minutes  Date of Admission:  12/06/2014 Date of Discharge: 12/19/2014  Reason for Admission:  Per H&P Note:  39 Y/O male who states he was in a community housing in [place]. He had to go to jail 17 days in [place] and now he does not know if he is out of the house. States he saw his mind go back to old ways of thinking. States his thoughts went back to drinking what opens the door to Crack. States that he started developing thoughts of suicide with plans to OD. States he went to a store was drunk, was taken to downtown. States he was checked in kept in jail to sober up. He thought it was over with, so he the threw papers away, and missed a court date. They issued a warrant for his arrest  The initial assessment is as follows: Mason Meadows is an 39 y.o. male with history of anxiety and depression. Patient presents to [facility] after being discharged from jail today. Sts that he was in jail for stealing a beer. Prior to going to jail patient was living in a [facility] located in [place]. Sts that he was doing well, taking his HIV meds, and receiving therapy. Patient sts that since he was arrested and went to jail he fears that he loss his housing in [place]. Patient has been without (depression and HIV) meds for 2 weeks. Patient also reports other stressors such as divorcing his partner 10 yrs ago, healing for a "total hip replacement", and hanging in "trap houses". Patient reports suicidal ideations with no plan. He denies suicidal plan or self mutilation. Patient reports depressive symptoms including loss of interest in usual pleasures, fatigue, and crying spells. He denies HI and AVH's. Patient has a history of alcohol  and crack cocaine. Last use of alcohol and drugs was 1 year ago. Patient reports a history of inpatient hospitalization at [facility] and [facility]. He has a Paramedic at [employer]".   Principal Problem: Bipolar I disorder, most recent episode depressed Discharge Diagnoses: Patient Active Problem List   Diagnosis Date Noted  . Alcohol abuse [F10.10] 12/07/2014  . Bipolar I disorder, most recent episode depressed [F31.30] 12/07/2014  . Mood disorder [F39] 12/06/2014  . Thrush [B37.0]   . 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 Noncompliance [Z91.19]   . Neutropenic fever [D70.9] 08/31/2014  . Fever [R50.9] 08/31/2014  . Chest pain [R07.9] 08/31/2014  . Pancytopenia [D61.818] 08/31/2014  . HIV (human immunodeficiency virus infection) [Z21] 08/31/2014  . Acute respiratory failure with hypoxia [J96.01] 05/31/2013  . Presumed Pneumocystosis pneumonia [B59] 05/31/2013  . Severe protein-calorie malnutrition [E43] 05/31/2013  . AIDS [B20] 05/31/2013    Musculoskeletal: Strength & Muscle Tone: within normal limits Gait & Station: normal Patient leans: N/A  Psychiatric Specialty Exam:  See Suicide Risk Assessment Physical Exam  Nursing note and vitals reviewed. Constitutional: He is oriented to person, place, and time.  Neck: Normal range of motion.  Respiratory: Effort normal.  Musculoskeletal: Normal range of motion.  Neurological: He is alert and oriented to person, place, and time.    Review of Systems  Endo/Heme/Allergies:       HIV  Psychiatric/Behavioral: Negative for  suicidal ideas and hallucinations. Depression: Stable. Nervous/anxious: Stable. Insomnia: Stable.   All other systems reviewed and are negative.   Blood pressure 145/100, pulse 84, temperature 97.7 F (36.5 C), temperature source Oral, resp. rate 18, height  (1.753 m), weight 91.627 kg (202 lb).Body mass index is 29.82 kg/(m^2).  Have you used any form of tobacco in the last 30 days? (Cigarettes,  Smokeless Tobacco, Cigars, and/or Pipes): Yes  Has this patient used any form of tobacco in the last 30 days? (Cigarettes, Smokeless Tobacco, Cigars, and/or Pipes) Yes, A prescription for an FDA-approved tobacco cessation medication was offered at discharge and the patient refused  Past Medical History:  Past Medical History  Diagnosis Date  . Pneumonia   . Anxiety   . AIDS (acquired immune deficiency syndrome)   . Depression   . Mood disorder 12/06/2014    Past Surgical History  Procedure Laterality Date  . Back surgery    . Total hip arthroplasty     Family History:  Family History  Problem Relation Age of Onset  . Hypertension Mother   . Diabetes Mellitus II Mother   . Hypertension Father   . Diabetes Mellitus II Father    Social History:  History  Alcohol Use No     History  Drug Use No    Comment: last time -2 months ago    History   Social History  . Marital Status: Single    Spouse Name: N/A  . Number of Children: N/A  . Years of Education: N/A   Social History Main Topics  . Smoking status: Current Every Day Smoker -- 1.00 packs/day for 10 years    Types: Cigarettes  . Smokeless tobacco: Never Used  . Alcohol Use: No  . Drug Use: No     Comment: last time -2 months ago  . Sexual Activity: Yes     Comment: Hasn't smoked   Other Topics Concern  . None   Social History Narrative    Risk to Self: Is patient at risk for suicide?: Yes What has been your use of drugs/alcohol within the last 12 months?: no use since 10/09/14- prior to that, was using etoh and crack Risk to Others:   Prior Inpatient Therapy:   Prior Outpatient Therapy:    Level of Care:  OP  Hospital Course:  Mason Meadows was admitted for Bipolar I disorder, most recent episode depressed and crisis management.  He was treated discharged with the medications listed below under Medication List.  Medical problems were identified and treated as needed.  Home medications were restarted as  appropriate.  Improvement was monitored by observation and Mason Meadows daily report of symptom reduction.  Emotional and mental status was monitored by daily self-inventory reports completed by Mason Meadows and clinical staff.         Mason Meadows was evaluated by the treatment team for stability and plans for continued recovery upon discharge.  Mason Meadows motivation was an integral factor for scheduling further treatment.  Employment, transportation, bed availability, health status, family support, and any pending legal issues were also considered during his hospital stay.  He was offered further treatment options upon discharge including but not limited to Residential, Intensive Outpatient, and Outpatient treatment.  Mason Meadows will follow up with the services as listed below under Follow Up Information.     Upon completion of this admission the patient was both mentally and medically stable for discharge denying suicidal/homicidal ideation, auditory/visual/tactile hallucinations, delusional thoughts  and paranoia.      Consults:  psychiatry  Significant Diagnostic Studies:  labs: Urinalysis, ETOH, CBC/Diff, CMET, UDS,   Discharge Vitals:   Blood pressure 145/100, pulse 84, temperature 97.7 F (36.5 C), temperature source Oral, resp. rate 18, height 5\' 9"  (1.753 m), weight 91.627 kg (202 lb). Body mass index is 29.82 kg/(m^2). Lab Results:   No results found for this or any previous visit (from the past 72 hour(s)).  Physical Findings: AIMS: Facial and Oral Movements Muscles of Facial Expression: None, normal Lips and Perioral Area: None, normal Jaw: None, normal Tongue: None, normal,Extremity Movements Upper (arms, wrists, hands, fingers): None, normal Lower (legs, knees, ankles, toes): None, normal, Trunk Movements Neck, shoulders, hips: None, normal, Overall Severity Severity of abnormal movements (highest score from questions above): None,  normal Incapacitation due to abnormal movements: None, normal Patient's awareness of abnormal movements (rate only patient's report): No Awareness, Dental Status Current problems with teeth and/or dentures?: No Does patient usually wear dentures?: No  CIWA:    COWS:      See Psychiatric Specialty Exam and Suicide Risk Assessment completed by Attending Physician prior to discharge.  Discharge destination:  Home  Is patient on multiple antipsychotic therapies at discharge:  No   Has Patient had three or more failed trials of antipsychotic monotherapy by history:  No    Recommended Plan for Multiple Antipsychotic Therapies: NA      Discharge Instructions    Activity as tolerated - No restrictions    Complete by:  As directed      Diet general    Complete by:  As directed      Discharge instructions    Complete by:  As directed   Take all of you medications as prescribed by your mental healthcare provider.  Report any adverse effects and reactions from your medications to your outpatient provider promptly. Do not engage in alcohol and or illegal drug use while on prescription medicines. In the event of worsening symptoms call the crisis hotline, 911, and or go to the nearest emergency department for appropriate evaluation and treatment of symptoms. Follow-up with your primary care provider for your medical issues, concerns and or health care needs.   Keep all scheduled appointments.  If you are unable to keep an appointment call to reschedule.  Let the nurse know if you will need medications before next scheduled appointment.            Medication List    STOP taking these medications        albuterol (2.5 MG/3ML) 0.083% nebulizer solution  Commonly known as:  PROVENTIL     azithromycin 600 MG tablet  Commonly known as:  ZITHROMAX     buPROPion 150 MG 12 hr tablet  Commonly known as:  WELLBUTRIN SR  Replaced by:  buPROPion 300 MG 24 hr tablet     fluconazole 200 MG  tablet  Commonly known as:  DIFLUCAN     predniSONE 20 MG tablet  Commonly known as:  DELTASONE     sulfamethoxazole-trimethoprim 800-160 MG per tablet  Commonly known as:  BACTRIM DS,SEPTRA DS      TAKE these medications      Indication   amphetamine-dextroamphetamine 30 MG tablet  Commonly known as:  ADDERALL  Take 1 tablet by mouth 2 (two) times daily with a meal.   Indication:  Attention Deficit Disorder     buPROPion 300 MG 24 hr tablet  Commonly known as:  WELLBUTRIN  XL  Take 1 tablet (300 mg total) by mouth daily.   Indication:  Depressive Phase of Manic-Depression     clonazePAM 0.5 MG tablet  Commonly known as:  KLONOPIN  Take 1 tablet (0.5 mg total) by mouth 3 (three) times daily as needed (anxiety).   Indication:  Anxiety     dolutegravir 50 MG tablet  Commonly known as:  TIVICAY  Take 1 tablet (50 mg total) by mouth daily.      emtricitabine-tenofovir 200-300 MG per tablet  Commonly known as:  TRUVADA  Take 1 tablet by mouth daily.      gabapentin 600 MG tablet  Commonly known as:  NEURONTIN  Take 2 tablets (1,200 mg total) by mouth 3 (three) times daily.   Indication:  Aggressive Behavior, Agitation     metroNIDAZOLE 0.75 % gel  Commonly known as:  METROGEL  Apply topically 2 (two) times daily.      pantoprazole 20 MG tablet  Commonly known as:  PROTONIX  Take 1 tablet (20 mg total) by mouth daily.   Indication:  Gastroesophageal Reflux Disease     QUEtiapine 25 MG tablet  Commonly known as:  SEROQUEL  Take 1 tablet (25 mg total) by mouth at bedtime.   Indication:  Mood control/sleep     sucralfate 1 G tablet  Commonly known as:  CARAFATE  Take 1 tablet (1 g total) by mouth 4 (four) times daily -  with meals and at bedtime.   Indication:  Gastroesophageal Reflux Disease     traZODone 100 MG tablet  Commonly known as:  DESYREL  Take 2 tablets (200 mg total) by mouth at bedtime as needed for sleep.   Indication:  Trouble Sleeping        Follow-up Information    Follow up with [facility] On 12/25/2014.   Why:  at 3:00pm for therapy with Alyssa.   Contact information:   [address]  First Floor [facility]  [place], [place] [address]  Phone: [phone]  Fax: [phone]      Follow up with [facility] On 01/14/2015.   Why:  for medication management.   Contact information:   [address] [place], [place], [address]  [phone]  [phone]      Follow-up recommendations:  Activity:  As tolerated Diet:  As tolerated  Comments:   Patient has been instructed to take medications as prescribed; and report adverse effects to outpatient provider.  Follow up with primary doctor for any medical issues and If symptoms recur report to nearest emergency or crisis hot line.    Total Discharge Time: 45 minutes  Signed: Assunta Found, FNP-BC 12/19/2014, 1:37 PM  I personally assessed the patient and formulated the plan Madie Reno A. Dub Mikes, M.D.

## 2014-12-19 NOTE — Progress Notes (Signed)
  [facility] Adult Case Management Discharge Plan :  Will you be returning to the same living situation after discharge:  No. Pt will live with a friend in [place] At discharge, do you have transportation home?: Yes,  transportation provided by friend Do you have the ability to pay for your medications: Yes,  Pt provided with supply and prescriptions  Release of information consent forms completed and in the chart;  Patient's signature needed at discharge.  Patient to Follow up at: Follow-up Information    Follow up with [facility] On 12/25/2014.   Why:  at 3:00pm for therapy with Alyssa.   Contact information:   [address]  First Floor [facility]  [place], [place] [address]  Phone: [phone]  Fax: [phone]      Follow up with [facility] On 01/14/2015.   Why:  for medication management.   Contact information:   [address] [place], [place], [address]  [phone]  [phone]      Patient denies SI/HI: Yes,  Pt denies    Safety Planning and Suicide Prevention discussed: Yes,  with Pt; declines family contact  Have you used any form of tobacco in the last 30 days? (Cigarettes, Smokeless Tobacco, Cigars, and/or Pipes): Yes  Has patient been referred to the Quitline?: Patient refused referral  Elaina Hoops 12/19/2014, 2:53 PM

## 2014-12-19 NOTE — Progress Notes (Signed)
Discharge note: Pt received both written and verbal discharge instructions. Pt verbalized understanding of discharge instructions. Pt agreed to f/u appt and med regimen. Pt received sample meds and prescriptions. Pt received belongings from room and locker. Pt denied suicidal thoughts at time of discharge. Pt safely left [facility] and discharged to the lobby at pt request.

## 2014-12-19 NOTE — Progress Notes (Signed)
Recreation Therapy Notes  Date: 08.03.16 Time: 930 am Location: [address] Group Room  Group Topic: Stress Management  Goal Area(s) Addresses:  Patient will verbalize importance of using healthy stress management.  Patient will identify positive emotions associated with healthy stress management.   Intervention: Stress Management  Activity : Guided Imagery Script. LRT will introduce and instruct patients on the stress management technique of guided imagery. Patients were asked to follow a long with a script read a loud by LRT to participate in the stress management technique of guided imagery.  Education: Stress Management, Discharge Planning.   Education Outcome: Acknowledges edcuation/In group clarification offered/Needs additional education  Clinical Observations/Feedback: Patient did not attend group.   Ebbie Sorenson, LRT/CTRS        Keari Miu A 12/19/2014 2:45 PM 

## 2014-12-19 NOTE — [facility] Suicide Risk Assessment (Signed)
[facility] Discharge Suicide Risk Assessment   Demographic Factors:  Male and Caucasian  Total Time spent with patient: 30 minutes  Musculoskeletal: Strength & Muscle Tone: within normal limits Gait & Station: normal Patient leans: normal  Psychiatric Specialty Exam: Physical Exam  Review of Systems  Constitutional: Negative.   HENT: Negative.   Eyes: Negative.   Respiratory: Negative.   Cardiovascular: Negative.   Gastrointestinal: Negative.   Genitourinary: Negative.   Musculoskeletal: Negative.   Skin: Negative.   Neurological: Negative.   Endo/Heme/Allergies: Negative.   Psychiatric/Behavioral: Positive for depression and substance abuse.    Blood pressure 145/100, pulse 84, temperature 97.7 F (36.5 C), temperature source Oral, resp. rate 18, height 5\' 9"  (1.753 m), weight 91.627 kg (202 lb).Body mass index is 29.82 kg/(m^2).  General Appearance: Fairly Groomed  Patent attorney::  Fair  Speech:  Clear and Coherent409  Volume:  Normal  Mood:  Euthymic  Affect:  Appropriate  Thought Process:  Coherent and Goal Directed  Orientation:  Full (Time, Place, and Person)  Thought Content:  plans as he moves on  Suicidal Thoughts:  No  Homicidal Thoughts:  No  Memory:  Immediate;   Fair Recent;   Fair Remote;   Fair  Judgement:  Fair  Insight:  Present  Psychomotor Activity:  Normal  Concentration:  Fair  Recall:  [employer] of Knowledge:Fair  Language: Fair  Akathisia:  No  Handed:  Right  AIMS (if indicated):     Assets:  Desire for Improvement Housing  Sleep:  Number of Hours: 5.75  Cognition: WNL  ADL's:  Intact   Have you used any form of tobacco in the last 30 days? (Cigarettes, Smokeless Tobacco, Cigars, and/or Pipes): Yes  Has this patient used any form of tobacco in the last 30 days? (Cigarettes, Smokeless Tobacco, Cigars, and/or Pipes) Yes, Prescription not provided because: niconitne patches given  Mental Status Per Nursing Assessment::   On Admission:   Suicidal ideation indicated by patient  Current Mental Status by Physician: In full contact with reality. There are no active SI plans or intent. He states he is committed to abstinence. Will be able to go back to his place of residence in 4 more weeks. Meanwhile he is going to stay with some friends.    Loss Factors: NA  Historical Factors: none  Risk Reduction Factors:   Positive social support and Positive coping skills or problem solving skills  Continued Clinical Symptoms:  Medical Diagnoses and Treatments/Surgeries  Cognitive Features That Contribute To Risk:  None    Suicide Risk:  Minimal: No identifiable suicidal ideation.  Patients presenting with no risk factors but with morbid ruminations; may be classified as minimal risk based on the severity of the depressive symptoms  Principal Problem: Bipolar I disorder, most recent episode depressed Discharge Diagnoses:  Patient Active Problem List   Diagnosis Date Noted  . Alcohol abuse [F10.10] 12/07/2014  . Bipolar I disorder, most recent episode depressed [F31.30] 12/07/2014  . Mood disorder [F39] 12/06/2014  . Thrush [B37.0]   . 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 Noncompliance [Z91.19]   . Neutropenic fever [D70.9] 08/31/2014  . Fever [R50.9] 08/31/2014  . Chest pain [R07.9] 08/31/2014  . Pancytopenia [D61.818] 08/31/2014  . HIV (human immunodeficiency virus infection) [Z21] 08/31/2014  . Acute respiratory failure with hypoxia [J96.01] 05/31/2013  . Presumed Pneumocystosis pneumonia [B59] 05/31/2013  . Severe protein-calorie malnutrition [E43] 05/31/2013  . AIDS [B20] 05/31/2013    Follow-up Information  Follow up with [facility] On 12/25/2014.   Why:  at 3:00pm for therapy with Alyssa.   Contact information:   [address]  First Floor [facility]  [place], [place] [address]  Phone: [phone]  Fax: [phone]      Follow up with [facility] On 01/14/2015.   Why:   for medication management.   Contact information:   [address] [place], [place], [address]  [phone]  [phone]      Plan Of Care/Follow-up recommendations:  Activity:  as tolerated Diet:  regular Follow up outpatient as above Is patient on multiple antipsychotic therapies at discharge:  No   Has Patient had three or more failed trials of antipsychotic monotherapy by history:  No  Recommended Plan for Multiple Antipsychotic Therapies: NA    Eyan Hagood A 12/19/2014, 1:47 PM

## 2014-12-19 NOTE — Tx Team (Signed)
Interdisciplinary Treatment Plan Update (Adult) Date: 12/19/2014    Time Reviewed: 9:30 AM  Progress in Treatment: Attending groups: Yes Participating in groups: Yes Taking medication as prescribed: Yes Tolerating medication: Yes Family/Significant other contact made: No, Pt declines family contact Patient understands diagnosis: Yes Discussing patient identified problems/goals with staff: Yes Medical problems stabilized or resolved: Yes Denies suicidal/homicidal ideation: Yes Issues/concerns per patient self-inventory: Yes Other:  New problem(s) identified: N/A  Discharge Plan or Barriers: 12/07/2014:  CSW continuing to assess, patient new to milieu. 12/11/2014: Continuing to assess. Pt wants to return to the [facility] area. 12/13/14: Pt would like to return to shelter in [facility] and continue with his opt providers at the [facility] ID clinic 8/2: Patient is uncertain if he can return to shelter in [facility] and is uncertain of living arrangements at discharge.  Reason for Continuation of Hospitalization:  Depression Anxiety Medication Stabilization   Comments: N/A  Estimated length of stay: 1-2 days   Patient is a 39 year old male admitted for depression and anxiety following his release from jail. Patient was living in a recovery house in [place] prior to being arrested. Patient will benefit from crisis stabilization, medication evaluation, group therapy, and psycho education in addition to case management for discharge planning. Patient and CSW reviewed pt's identified goals and treatment plan. Pt verbalized understanding and agreed to treatment plan.     Review of initial/current patient goals per problem list:  1. Goal(s): Patient will participate in aftercare plan   Met: Yes   Target date: 3-5 days   As evidenced by: Patient will participate within aftercare plan AEB aftercare provider and housing plan at discharge being identified.  7/22: Goal not met:  CSW assessing for appropriate referrals for pt and will have follow up secured prior to d/c. 7/26: Pt continues to be unsure about his discharge plan. If he is unable to return to the [facility] in [place], [place] will also need a new provider. 7/28: Pt will return to [facility] and follow-up with pre-established providers. 8/2: Patient unsure if he can return to previous living situation in [facility] and is unsure of his plans at discharge. 8/3: Pt is returning to [facility] while he waits for a bed at the shelter to open back up at the shelter. He will follow-up with the [facility] ID clinic and the clinic at the shelter.   2. Goal (s): Patient will exhibit decreased depressive symptoms and suicidal ideations.   Met: Yes   Target date: 3-5 days   As evidenced by: Patient will utilize self rating of depression at 3 or below and demonstrate decreased signs of depression or be deemed stable for discharge by MD.  7/22: Goal not met: Pt presents with flat affect and depressed mood.  Pt admitted with depression rating of 10.  Pt to show decreased sign of depression and a rating of 3 or less before d/c.   7/26: Pt continues to rate depression at 6/10, however presents with bright affect around peers. Pt also endorses passive SI. 7/28: Goal not met. Pt continues to express feelings of depression and exhibits symptoms of the same.  8/2: Goal progressing. Patient's self-report of depressive symptoms and SI are inconsistent with his affect and behavior on the milieu.  8/3: Pt denies depression today and reports no suicidal ideation.    3. Goal(s): Patient will demonstrate decreased signs and symptoms of anxiety.   Met: Yes   Target date:  3-5 days   As evidenced by: Patient will utilize self rating of anxiety at 3 or below and demonstrated decreased signs of anxiety, or be deemed stable for discharge by MD  7/22: Goal not met: Pt presents with anxious mood and affect.  Pt admitted  with anxiety rating of 10.  Pt to show decreased sign of anxiety and a rating of 3 or less before d/c. 7/26: Pt continues to rate anxiety highly, approximately 8/10. 12/13/2014: Goal not met. Pt continues to express feelings of anxiety and exhibits symptoms of the same.  8/2: Goal progressing. Patient's self-report of anxiety symptoms are inconsistent with his affect and behavior on the milieu.  8/3: Pt reports manageable anxiety and no symptoms are demonstrated.   4. Goal(s): Patient will demonstrate decreased signs of withdrawal due to substance abuse   Met: Yes   Target date: 3-5 day   As evidenced by: Patient will produce a CIWA/COWS score of 0, have stable vitals signs, and no symptoms of withdrawal  7/22: Goal met: No withdrawal symptoms reported at this time per medical chart.    Attendees: Patient:    Family:    Physician:  Dr. Sabra Heck 12/18/2014 9:30 AM  Nursing:  Markham Jordan, Darrol Angel ,RN 12/18/2014 9:30 AM  Clinical Social Worker: Tilden Fossa,  [place] 12/18/2014 9:30 AM  Other: Peri Maris, LCSWA  12/18/2014 9:30 AM  Other: Lucinda Dell, Beverly Sessions Liaison 12/18/2014 9:30 AM  Other: Lars Pinks, Case Manager 12/18/2014 9:30 AM  Other: Ave Filter , NP 12/18/2014 9:30 AM  Other:    Other:        Scribe for Treatment Team:  Peri Maris, Latanya Presser [phone]

## 2016-07-21 IMAGING — CR DG CHEST 2V
2 series · 2 of 2 positions shown · non-contrast
Comparison: 06/14/2013

CLINICAL DATA: Right flank and rib pain, symptoms for 1 week.

EXAM:
CHEST  2 VIEW

[chest pa]
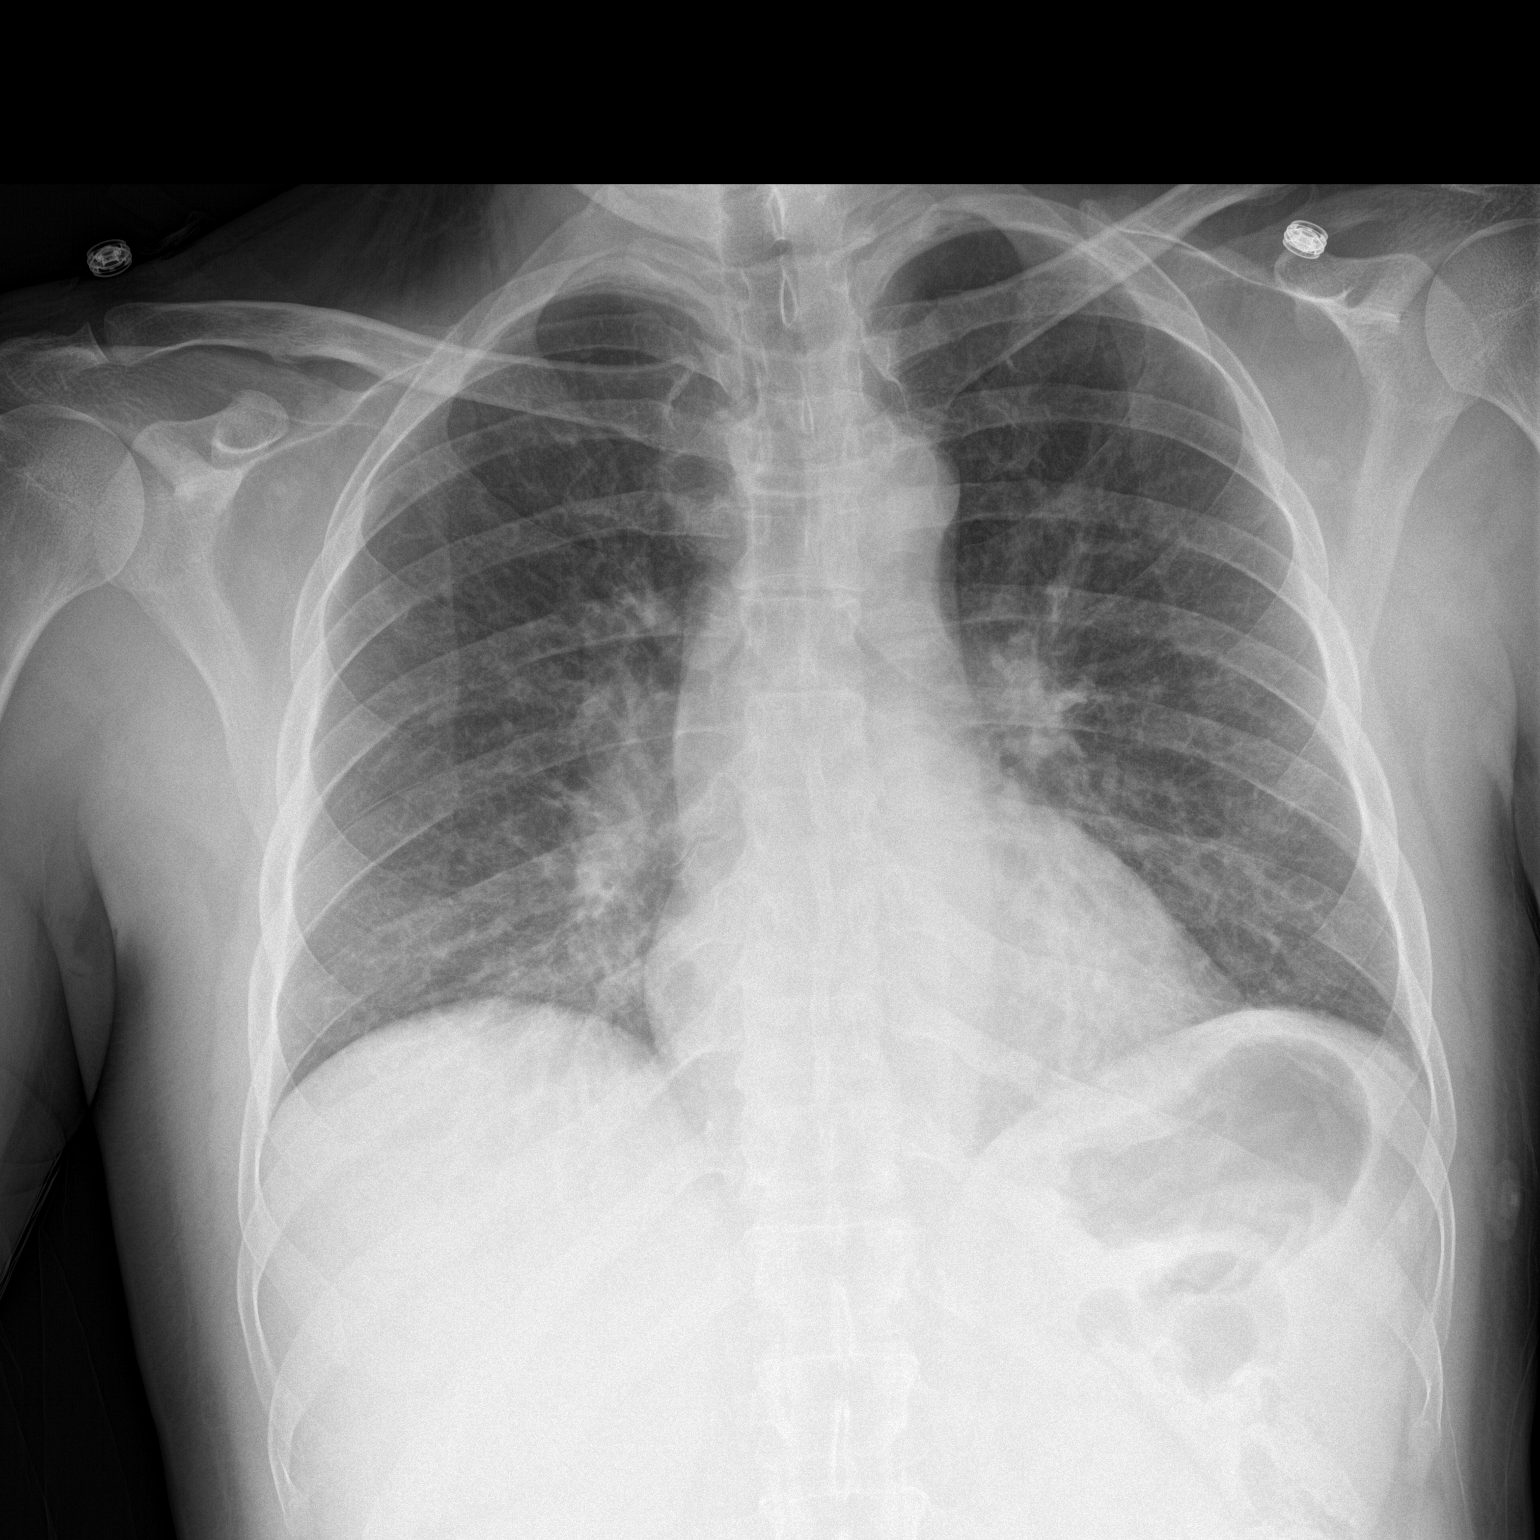

[chest lat]
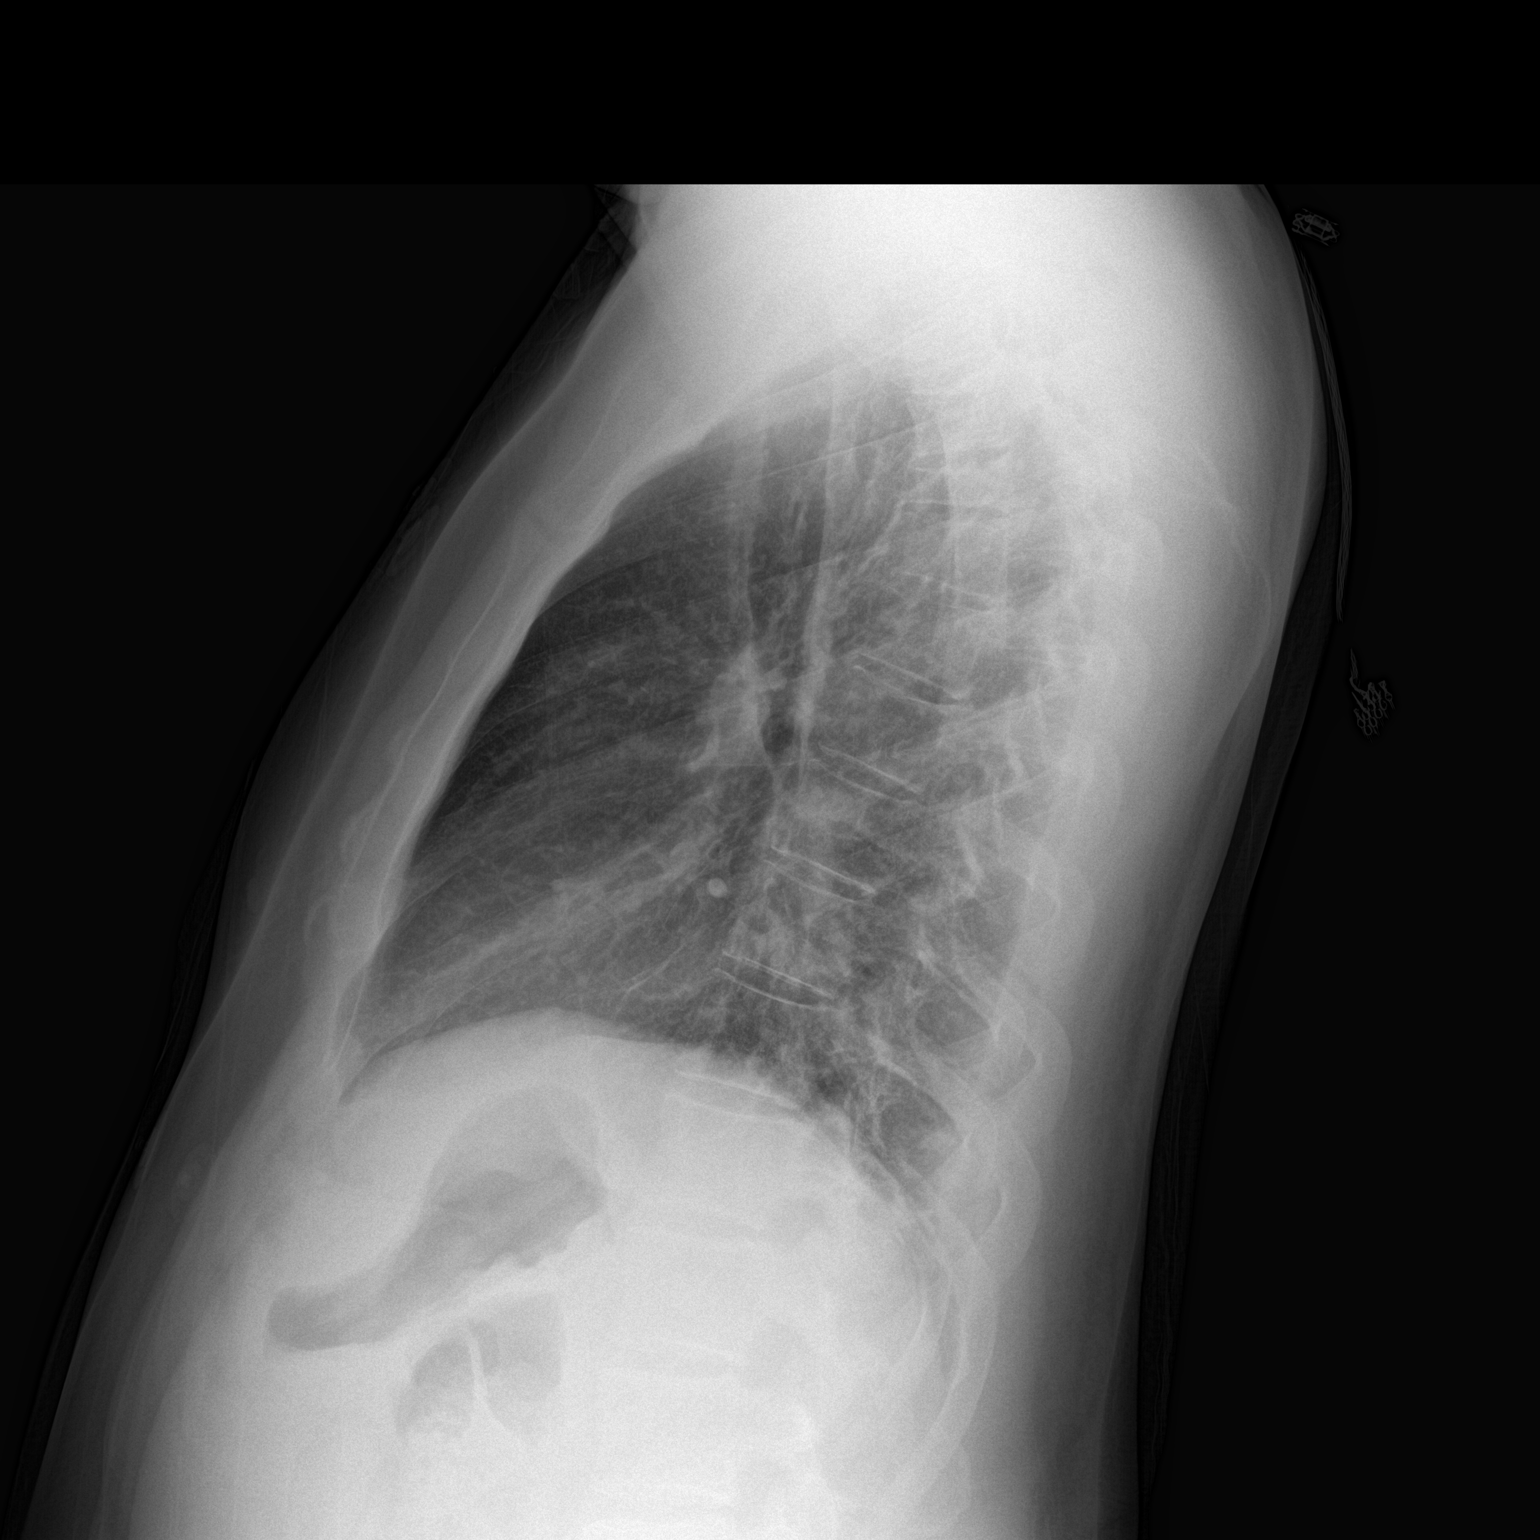

[2 of 2 positions shown; findings below may reference images not displayed]

FINDINGS: Lung volumes are low with mild bibasilar atelectasis. The
cardiomediastinal contours are normal. Pulmonary vasculature is
normal. No consolidation, pleural effusion, or pneumothorax. No
acute osseous abnormalities are seen.
IMPRESSION: Hypoventilatory chest with bibasilar atelectasis.

## 2016-07-21 IMAGING — CT CT ANGIO CHEST
2 of 8 series · 18 of 46 positions shown · IV contrast (Omni 300)
Comparison: None.

CLINICAL DATA: Right-sided chest pain with inspiration

EXAM:
CT ANGIOGRAPHY CHEST WITH CONTRAST
TECHNIQUE: Multidetector CT imaging of the chest was performed using the
standard protocol during bolus administration of intravenous
contrast. Multiplanar CT image reconstructions and MIPs were
obtained to evaluate the vascular anatomy.
CONTRAST:  75mL OMNIPAQUE IOHEXOL 350 MG/ML SOLN

[Series 5: thins · axial · 0.65mm/px · z∈[+1330,+1594]mm · 15 of 291 slices shown]
[im 14/291  lung]
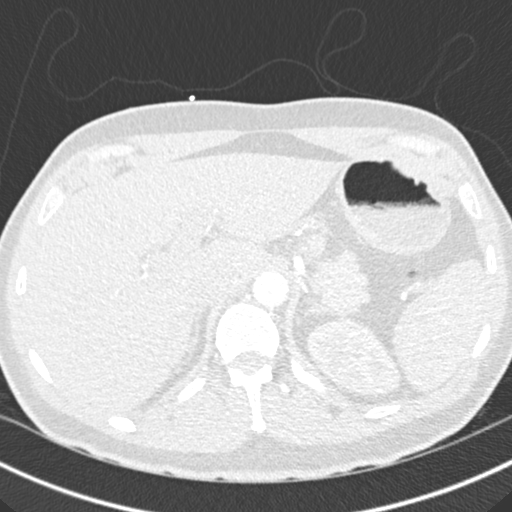
[im 40/291  soft-tissue]
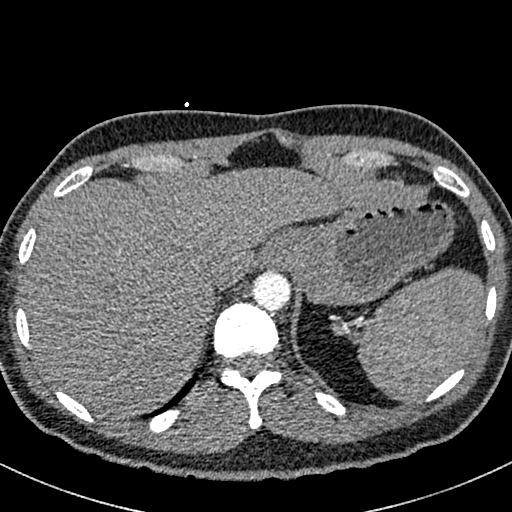
[im 53/291  lung]
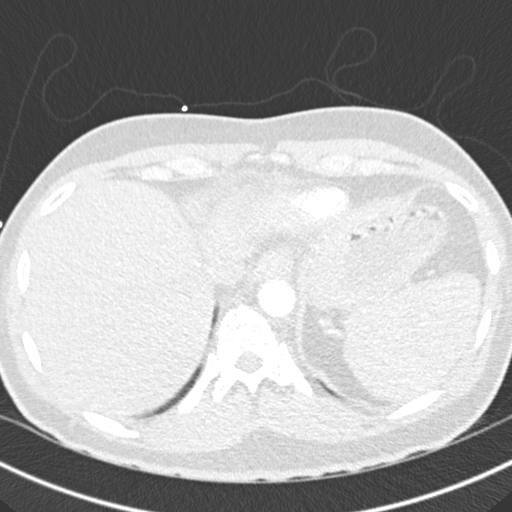
[im 66/291  soft-tissue]
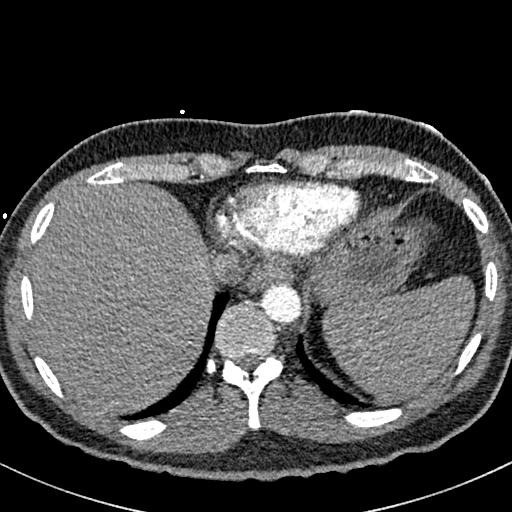
[im 93/291  lung]
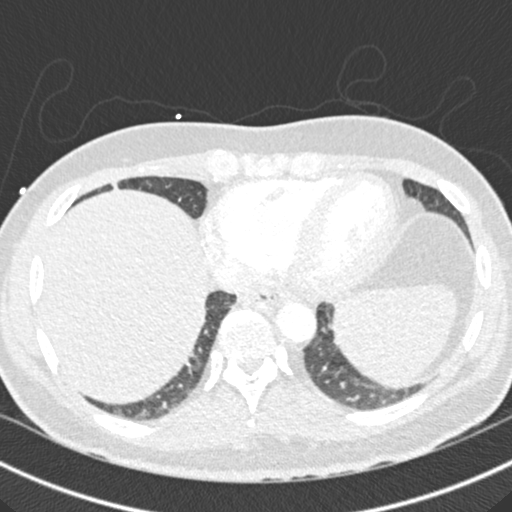
[im 106/291  soft-tissue]
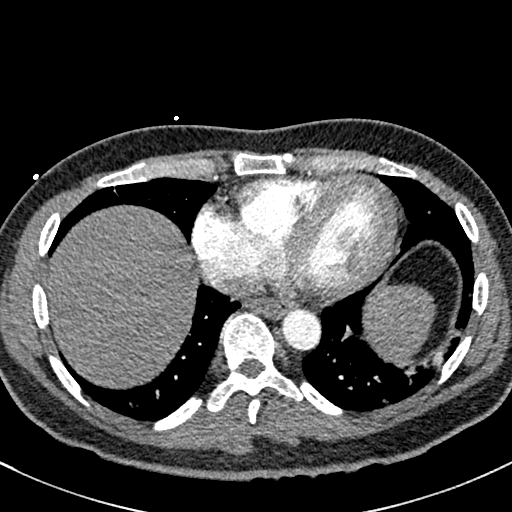
[im 132/291  lung]
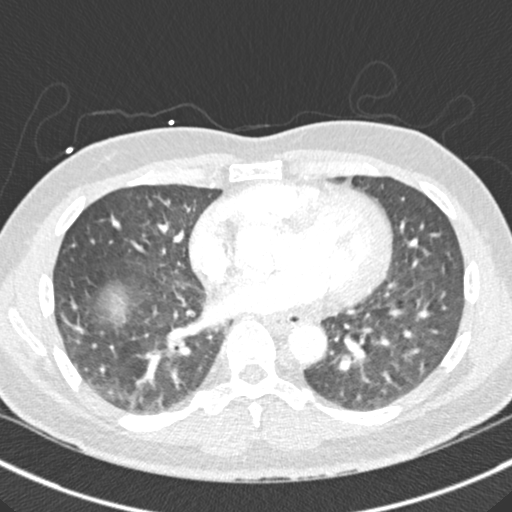
[im 146/291  soft-tissue]
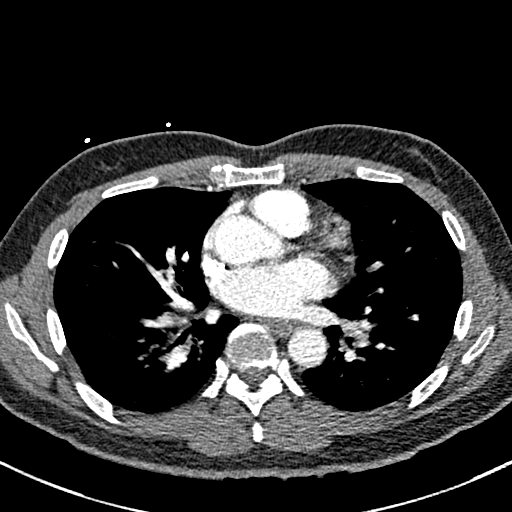
[im 159/291  lung]
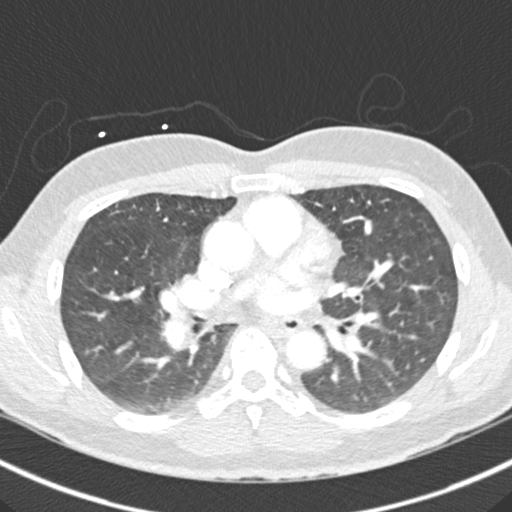
[im 185/291  soft-tissue]
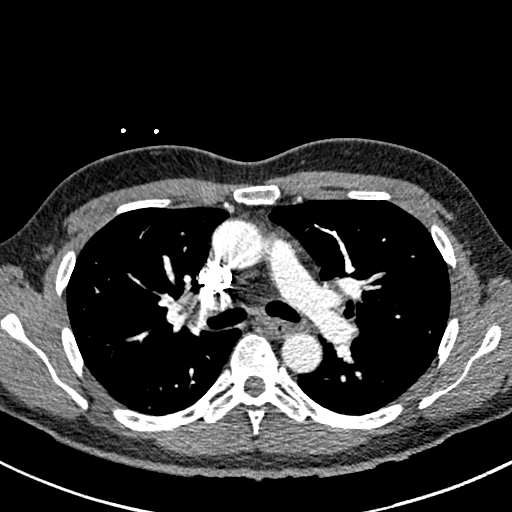
[im 198/291  lung]
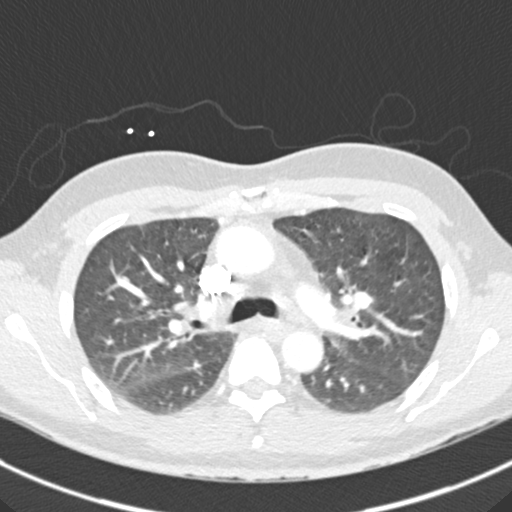
[im 225/291  soft-tissue]
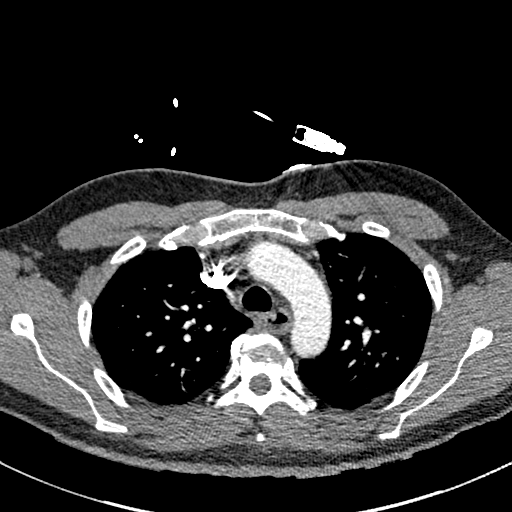
[im 238/291  lung]
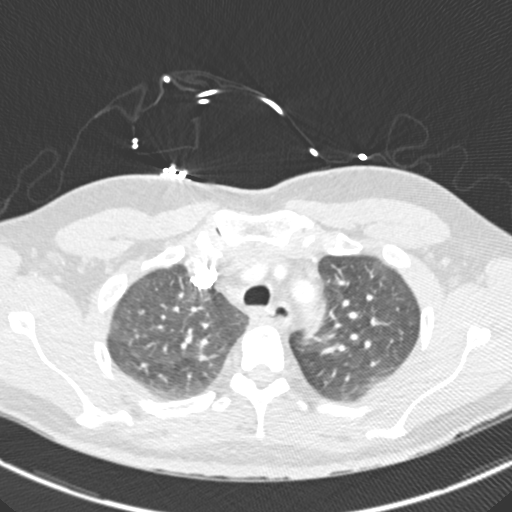
[im 251/291  soft-tissue]
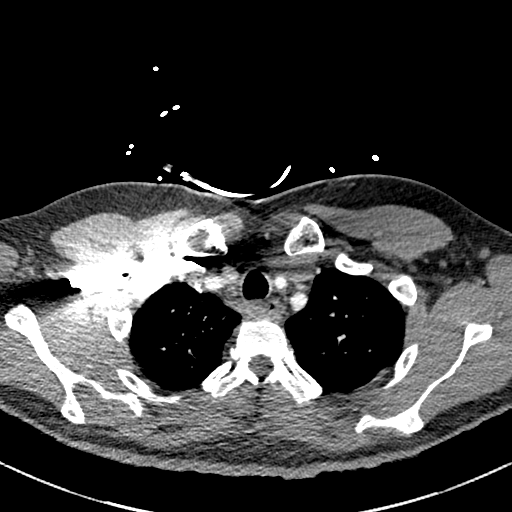
[im 277/291  lung]
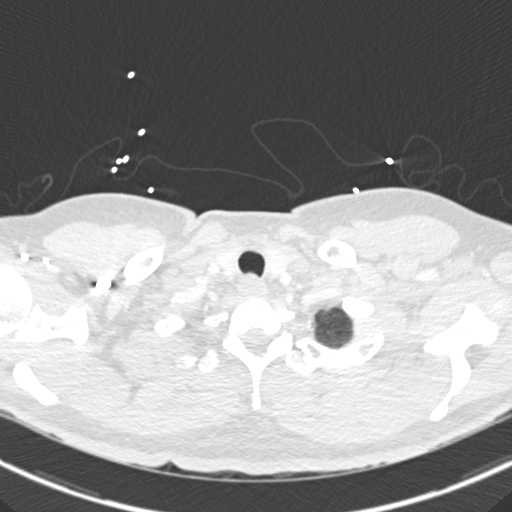

[Series 7: coronal mpr · coronal · 0.59mm/px · 3 of 112 slices shown]
[im 28/112  soft-tissue]
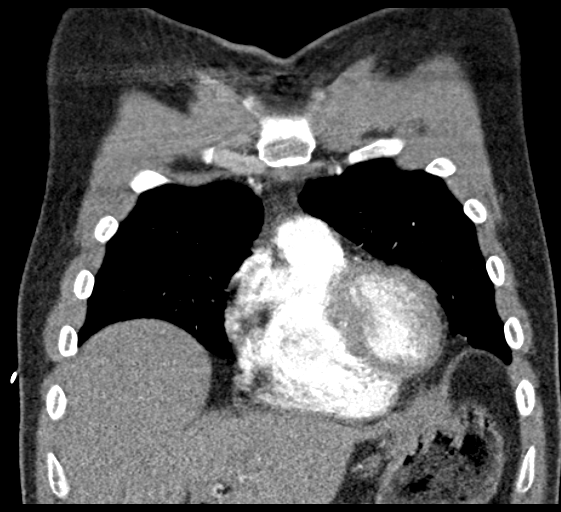
[im 56/112  soft-tissue]
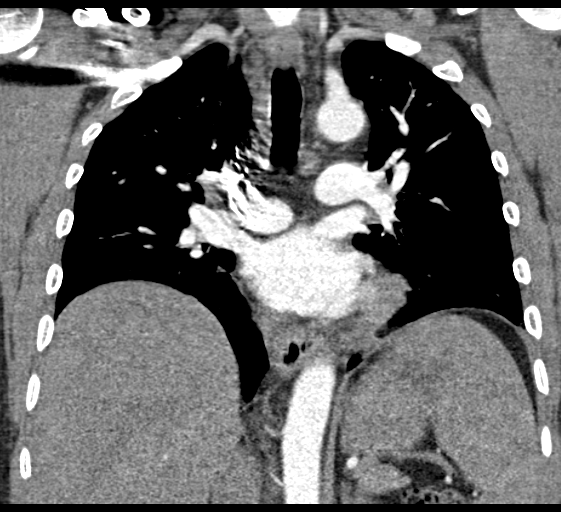
[im 84/112  soft-tissue]
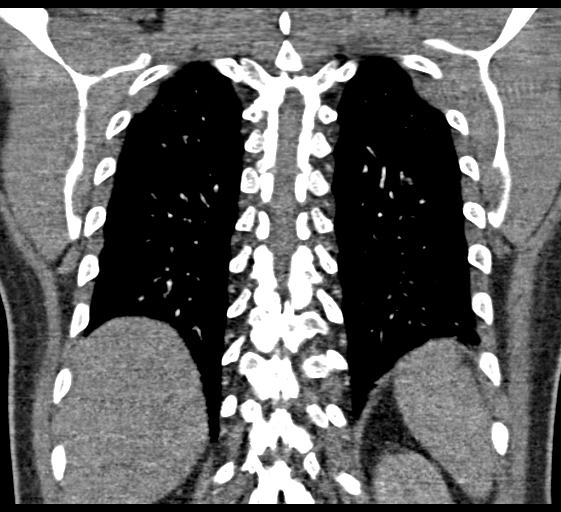

[18 of 46 positions shown; findings below may reference images not displayed]

FINDINGS: THORACIC INLET/BODY WALL:

No acute abnormality.

MEDIASTINUM:

Normal heart size. No pericardial effusion. No acute vascular
abnormality. Borderline diffuse esophageal thickening. CTA of the
pulmonary arteries is limited by motion, especially at the bases,
but exam is overall diagnostic and negative for pulmonary embolism.
No aortic aneurysm or dissection.

LUNG WINDOWS:

Central bronchial wall thickening with intermittent mucoid
impaction. There is no focal pneumonia. No edema, effusion, or
pneumothorax. No suspicious ground-glass density or cystic change in
this patient with history presumed pneumocystis pneumonia. There are
few scattered pulmonary nodules, with the larger in the left upper
lobe on image 26 measuring 6 mm.

UPPER ABDOMEN:

No acute findings.

OSSEOUS:

Remote T3, T4, T5, T6, and T7 superior endplate/compression
fractures with exaggerated thoracic kyphosis. Height loss is worst
at T4, approximately 75%. There is bilateral foraminal narrowing at
T4-5 from disc loss and facet fusion with bony overgrowth.

Review of the MIP images confirms the above findings.
IMPRESSION: 1. No evidence of pulmonary embolism.
2. Bronchitis.
3. 6 mm left upper lobe pulmonary nodule. Given smoking history,
follow-up chest CT at 6 - 12 months is recommended. This
recommendation follows the consensus statement: Guidelines for
Management of Small Pulmonary Nodules Detected on CT Scans: A
Statement from the [HOSPITAL] as published in Radiology
4881;[DATE].
4. Remote compression/superior endplate fractures of T3 through T7
with exaggerated kyphosis.

## 2016-11-27 ENCOUNTER — Ambulatory Visit
Admission: RE | Admit: 2016-11-27 | Discharge: 2016-11-27 | Disposition: A | Discharge status: Home / Self Care | Payer: MEDICARE

## 2016-11-27 DIAGNOSIS — B20 Human immunodeficiency virus [HIV] disease: Principal | ICD-10-CM

## 2016-11-27 DIAGNOSIS — Z113 Encounter for screening for infections with a predominantly sexual mode of transmission: Secondary | ICD-10-CM

## 2016-11-27 DIAGNOSIS — Z1159 Encounter for screening for other viral diseases: Secondary | ICD-10-CM

## 2016-11-27 DIAGNOSIS — Z7289 Other problems related to lifestyle: Secondary | ICD-10-CM

## 2016-12-01 LAB — HIV RNA, QUANTITATIVE, PCR

## 2016-12-01 LAB — HIV RNA COMMENT: Lab: 0

## 2016-12-03 NOTE — Unmapped (Signed)
Assessment/Plan      Last visit w/me 08/2016    HIV  Doing very well. No missed or late doses of FTC/TAF + DTG.  Not eligible yet for annual CD4 checks (earliest would be mid-2020... once stably over 300 x2Y).    Lab Results   Component Value Date    ACD4 413 (L) 11/27/2016    CD4 12 (L) 11/27/2016    HIVCP <40 (H) 07/18/2016    HIVRS Not Detected 11/27/2016     ?? Continue current therapy.  ?? No labs today (recently drawn).  ?? Encouraged continued excellent ARV adherence.      Transitioning from male to male  See note 08/2016 for full details. She remains on estradiol through [facility] in [place] (Dr Arlester Marker) and is having levels monitored there. She's investigating options for breast augmentation currently thru Dr Arnoldo Lenis office @ [facility].  ?? Monitor progress over time  ?? Current HIV meds have no interactions w/any meds for transitioning      Polysubstance use/abuse  Last used about 1Y ago now, per pt on 12/10/2016. Preferred drugs = cocaine (crack + powder), pain Rx, EtOH. Principally was self-medicating for hip/back pain. Down to 1/4ppd (see below). Attends NA mtgs, but not daily. Is currently on Suboxone through Dr Chrys Racer (@ [facility] venue in [facility]).  ?? Again commended her on sobriety  ?? Continue Suboxone per Dr Chrys Racer      Bipolar disorder  Reports stability. No recent changes in any meds; generally feels like her mood is better after starting on hormones.  ?? Check in periodically  ?? Verify doses of meds @ next RTC      Essential HTN  Was 130/89 @ intake. No changes in antiHTN meds. Doing OK overall. No missed doses.  ?? Continue HCTZ      Nicotine dependence, uncomplicated, cigarettes  Walking down slowly - currently @ 1/4ppd, down from 1/2 @ last visit. No longer using nicotine patches.  ?? Monitor progress toward cessation      Sexual health & secondary prevention  Sex with men. Monogamous w/single male partner since approx Sep 2017 (HIV-negative).   Since last visit, has had receptive anal sex and has not had add'l STI screening.   She does disclose status. Never uses condoms. (She identifies as a bottom for anal sex.)    Lab Results   Component Value Date    RPR Nonreactive 11/27/2016    CTNAA Negative 02/13/2016    CTNAA Negative 10/11/2015    CTNAA Negative 10/11/2015    CTNAA Negative 10/11/2015    GCNAA Negative 02/13/2016    GCNAA Negative 10/11/2015    GCNAA Negative 10/11/2015    GCNAA Negative 10/11/2015    SPECTYPE Urine 02/13/2016    SPECTYPE Urine 10/11/2015    SPECTYPE Swab 10/11/2015    SPECTYPE Swab 10/11/2015    SPECSOURCE Urine 02/13/2016    SPECSOURCE Urine 10/11/2015    SPECSOURCE Throat-[place] Only 10/11/2015    SPECSOURCE Rectal-[place] Only 10/11/2015     ?? GC/CT NAATs -- needed but deferred to future visit  <==========  ?? RPR -- NR 11/2016      Health maintenance  Lab Results   Component Value Date    CREATININE 0.97 11/27/2016    QFTTBGOLD Negative 04/15/2015    HEPCAB Nonreactive 11/27/2016    CHOL 184 04/15/2015    HDL 69 (H) 04/15/2015    LDL 104 (H) 04/15/2015    LDL 104.2 (H) 04/15/2015  NONHDL 115 04/15/2015    TRIG 55 04/15/2015     Communicable diseases  # TB - neg IGRA 03/2015 -- NEED TO UPDATE  <============  # HCV - negative Ab 11/2016; rescreen w/Ab q1-2y    Cancer screening  # Anorectal - not yet done -- NEEDED  # Breast - not applicable  # Cervical - not applicable  # Colorectal - not yet needed  # Liver - not applicable  # Lung - not applicable  # Prostate - not yet done; confirmed no FHx    Cardiovascular disease  # The 10-year ASCVD risk score Denman George DC Jr., et al., 2013) is: 2.6%  - Not on ASA  - Not on statin  - BP control fairly good  - Current smoker      Immunization History   Administered Date(s) Administered   ??? Influenza Virus Vaccine, unspecified formulation 03/15/2015, 02/16/2016   ??? Pneumococcal Polysaccharide 23 01/03/2015     ?? Screening ordered today: none  ?? Immunizations ordered today: none      Counseling services took 15 minutes of today's visit time. Disposition  Return to clinic 3-4 months or sooner if needed.    To do @ next RTC  ?? IGRA  ?? Anal Pap  ?? HCV Ab  ?? Suboxone dose same?  ?? Bipolar regimen - verify & identify prescriber  ?? Movm't toward augmentation?           Subjective      Chief Complaint   Follow-up HIV      HPI  In addition to details in A&P above:  Doing well overall and aside from above, denies any interim c/o since last clinic visit.    She's trying to get things lined up to have breast augmentation done thru Dr [facility] practice @ [facility].        Past Medical History:   Diagnosis Date   ??? ADHD (attention deficit hyperactivity disorder)    ??? Alcoholism (CMS-HCC)    ??? Anorexia nervosa    ??? Anxiety    ??? Bipolar disorder (CMS-HCC)    ??? Depression    ??? Depressive disorder    ??? HIV (human immunodeficiency virus infection) (CMS-HCC)    ??? Migraines    ??? Panic disorder    ??? R/O Borderline personality disorder 12/02/2015   ??? Seizures (CMS-HCC)    ??? Substance abuse    ??? Substance Use Treatment History          Social History  Housing - in men's shelter in [facility]; is hopeful about placement in apt ([facility] in [facility]) in [facility] with other residents  School / Work - Not in school. On disability.    Tobacco - 1/2-1.5 ppd x 20 years as of 2018  Alcohol - 2x/month w/dining out (though prob really ought to be none, 2/2 recovery)  Substance use - previous (coke + crack, Rx pain pills, EtOH, minor mj).      Review of Systems  As per HPI. All others negative.    Medications and Allergies  She has a current medication list which includes the following prescription(s): buprenorphine-naloxone, bupropion, descovy, estradiol, gabapentin, hydrochlorothiazide, ibuprofen, melatonin, spironolactone, tamsulosin, tivicay, albuterol, and nicotine.    Allergies: Abilify [aripiprazole]; Amoxicillin; Penicillins; Toradol [ketorolac]; and Ultram [tramadol]    Family History  Her family history includes ADD / ADHD in his mother; Anxiety disorder in his father, mother, and sister; Bipolar disorder in his cousin, maternal aunt, mother, and sister; Depression  in his cousin, maternal aunt, mother, and sister; Drug abuse in his brother, cousin, and mother; Paranoid behavior in his sister; Seizures in his sister.          Objective      BP 130/89  - Pulse 96  - Temp 36.6 ??C (97.9 ??F) (Oral)  - Ht 175.3 cm (5' 9)  - Wt (!) 107.2 kg (236 lb 6.4 oz)  - BMI 34.91 kg/m??     Const looks well and attentive, alert, appropriate   Eyes sclerae anicteric, noninjected OU   ENT dentition good and no thrush, leukoplakia or oral lesions   Lymph no cervical or supraclavicular LAD   CV RRR. No murmurs. No rub or gallop. S1/S2.   Resp CTAB ant/post, normal work of breathing   GI Obese, soft. NTND. NABS.   GU deferred   Rectal deferred   Skin no petechiae, ecchymoses or obvious rashes on clothed exam   MSK no joint tenderness   Neuro CN II-XII grossly intact, MAEE, non focal   Psych Appropriate affect. Eye contact good. Linear thoughts. Fluent speech.          [id number]

## 2016-12-07 NOTE — Unmapped (Signed)
[facility] Specialty Pharm[facility] Refill Coordination Note  Medication: TIVICAY  DESCOVY    Unable to reach patient to schedule shipment for medication being filled at [facility]. Left voicemail on phone.  As this is the 3rd unsuccessful attempt to reach the patient, no additional phone call attempts will be made at this time.      Phone numbers attempted: [phone]  [phone]  Last scheduled delivery: SHIPPED 11/10/16    Please call the [facility] at [phone] (option 4) should you have any further questions.      Thanks,  [facility] Shared [facility] Specialty Team      [facility] Specialty Pharmacy Refill Coordination Note  Specialty Medication(s): [place], [id number]  Additional Medications shipped: ventolin, gabapentin, tamsulosin, bupropion, hctz    Ryan Robinson, DOB: August 24, 1975  Phone: [phone] (home) , Alternate phone contact: N/A  Phone or address changes today?: No  All above HIPAA information was verified with patient.  Shipping Address: [address] [name] [address]   Insurance changes? No    Completed refill call assessment today to schedule patient's medication shipment from the [facility] [phone]).      Confirmed the medication and dosage are correct and have not changed: Yes, regimen is correct and unchanged.    Confirmed patient started or stopped the following medications in the past month:  No, there are no changes reported at this time.    Are you tolerating your medication?:  Ryan Robinson reports tolerating the medication.    ADHERENCE    Did you miss any doses in the past 4 weeks? No missed doses reported.    FINANCIAL/SHIPPING    Delivery Scheduled: Yes, Expected medication delivery date: Wed, Aug 1     Ryan Robinson did not have any additional questions at this time.    Delivery address validated in [facility] scheduling system: Yes, address listed in [facility] is correct.    We will follow up with patient monthly for standard refill processing and delivery.      Thank you,  Tawanna Solo Shared [facility] Specialty Pharmacist

## 2016-12-10 ENCOUNTER — Ambulatory Visit: Admission: RE | Admit: 2016-12-10 | Discharge: 2016-12-10 | Payer: MEDICARE | Attending: Infectious Disease

## 2016-12-10 DIAGNOSIS — F17210 Nicotine dependence, cigarettes, uncomplicated: Secondary | ICD-10-CM

## 2016-12-10 DIAGNOSIS — I10 Essential (primary) hypertension: Secondary | ICD-10-CM

## 2016-12-10 DIAGNOSIS — F3170 Bipolar disorder, currently in remission, most recent episode unspecified: Secondary | ICD-10-CM

## 2016-12-10 DIAGNOSIS — B20 Human immunodeficiency virus [HIV] disease: Principal | ICD-10-CM

## 2016-12-10 NOTE — Unmapped (Addendum)
[email or web address]    [email or web address]        Please try to arrive 30 minutes BEFORE your scheduled appointment time!  This will give you time to fill out any front desk paperwork needed for your visit, and allow you to be seen as close to your scheduled appointment time as possible.      IMPORTANT INFORMATION regarding Juanell Fairly eligibility and HMAP (formerly known as ADAP)  Remember that renewals are required twice a year: January-February 15th and July-August 15th.  Failure to renew your application will result in an inability to receive medications or services.    To renew, you need:  ?? [facility] Identification (if you don't have one, a bill with your name and address in [place] will work)  ?? proof of household income (current year's Social Security award letter and/or two most recent paycheck stubs and tax returns)  ?? proof of health insurance (if applicable)    If you can't come in to complete the renewal paperwork, we can mail, fax, or email the forms to you. Please contact one of the Financial Counselors directly:  ?? Sherene Sires - [phone]  ?? Avel Peace - [phone]    The number for [facility] is [phone].      Your Most Recent CD4 T-cell Counts  Here are a couple of things to keep in mind when looking at your numbers:  ?? For most people, we're checking CD4 counts every other visit (once or twice a year).  ?? It's normal for your CD4 count to be different from visit to visit. Dr. Ian Bushman should have discussed this with you before, but feel free to contact him with any questions you might have.    Absolute CD4 Count (/uL)   Date Value Status   11/27/2016 413 (L) Final   07/18/2016 277 (L) Final   10/11/2015 288 (L) Final   08/13/2014 <10 (L) Final         About your Medications  Please try to bring your medication bottles with you to every visit.    For refills, please contact your pharmacy and ask them to electronically send or fax the request to the ID Clinic.      Walk-in Clinic  Monday, Tuesday, Thursday, and Friday from 8:30 - 12 noon    Please call ahead to speak with the nursing staff if you plan to come to walk-in!      Contacting us   During working hours  [phone]      [phone]  (toll free)   After hours or weekends [phone] and ask for the ID doctor on-call    Fax number   [phone]    Mail    Ladona Horns, MD      c/o Infectious Diseases Clinic      [address]      [place], [place]  [address]

## 2016-12-15 MED FILL — GABAPENTIN/300MG/CAPS: GABAPENTIN/300MG/CAPS | 30 days supply | Qty: 360 | Fill #9

## 2016-12-15 MED FILL — HYDROCHLOROTHIAZID/25MG/TABS: HYDROCHLOROTHIAZID/25MG/TABS | 30 days supply | Qty: 30 | Fill #9

## 2016-12-15 MED FILL — BUPROPION HCL XL/150MG/TAB: BUPROPION HCL XL/150MG/TAB | 30 days supply | Qty: 90 | Fill #9

## 2016-12-15 MED FILL — TIVICAY/50MG/TAB: TIVICAY/50MG/TAB | 30 days supply | Qty: 30 | Fill #2

## 2016-12-15 MED FILL — VENTOLIN HFA/90MCG/AER: VENTOLIN HFA/90MCG/AER | 17 days supply | Qty: 1 | Fill #9

## 2016-12-15 MED FILL — DESCOVY/200MG/25MG/TABS: DESCOVY/200MG/25MG/TABS | 30 days supply | Qty: 30 | Fill #2

## 2016-12-15 MED FILL — TAMSULOSIN/0.4MG/CAP: TAMSULOSIN/0.4MG/CAP | 30 days supply | Qty: 30 | Fill #9

## 2016-12-18 NOTE — Unmapped (Signed)
REASON:  Health Maintenance    ASSESSMENT: Patient called to check on appointment time and lab results    ADHERENCE: per labs, 100%    INTERVENTION:  Let patient know that her VL is undetectable and her CD4 count is up to >400.  Praised patient for taking medications as directed and keeping appointments.  Also let patient know when her next appointment with Dr. Ian Bushman is.      PLAN:     CONTACT DURATION:

## 2016-12-18 NOTE — Unmapped (Signed)
REASON: Health Maintenance/ Transition and Adherence    ASSESSMENT: Patient called to discuss with nurse wanting top surgery.  She has Medicaid and Medicare and would like to find a Engineer, petroleum to help with that.    ADHERENCE: 100%    INTERVENTION:  Printed paperwork for patient on funding and grants for Transgender Transition help.  Also called Dr. Kirtland Bouchard. McPhee's office asking about insurance acceptance.  Her office does not bill insurance so the surgery would have an out of pocket cost of [phone].  Patient is asking her provider at [facility] for a letter of recommendation for the surgery.  She will also be asking Dr. Ian Bushman.      PLAN: Will continue to help patient with her quest for surgery.  She is continuing to stay sober and taking all prescribed medications so praise is given to her with each visit.    CONTACT DURATION: 15 minutes

## 2017-01-02 ENCOUNTER — Emergency Department
Admission: EM | Admit: 2017-01-02 | Discharge: 2017-01-02 | Discharge status: Against Medical Advice | Payer: MEDICARE | Source: Intra-hospital

## 2017-01-02 ENCOUNTER — Emergency Department
Admission: EM | Admit: 2017-01-02 | Discharge: 2017-01-02 | Discharge status: Against Medical Advice | Source: Intra-hospital

## 2017-01-02 DIAGNOSIS — M25552 Pain in left hip: Principal | ICD-10-CM

## 2017-01-02 MED ORDER — LIDOCAINE 5 % TOPICAL PATCH
MEDICATED_PATCH | TRANSDERMAL | 0 refills | 0.00000 days | Status: CP
Start: 2017-01-02 — End: 2017-02-01

## 2017-01-02 NOTE — Unmapped (Signed)
[facility]  Emergency Department Provider Note    ED Clinical Impression     Final diagnoses:   Sciatica of left side without back pain (Primary)       Initial Impression, ED Course, Assessment and Plan     Impression:   41 year old trans male to male with history of HIV, hypertension, avascular necrosis of right hip, bipolar disorder who presents with left gluteal pain that occasionally radiates down the left leg. There is no history of trauma. The patient has no red flag symptoms for back pain making cauda equina syndrome unlikely. I am most concerned for sacroiliitis versus sciatica. I suspect sciatica due to the nature of the shooting pains down the left leg. Due to patient's history of avascular necrosis of the right hip, I will obtain an x-ray of the left hip to rule out for avascular necrosis.    1736  Patient walked back from radiology with radiology tech. She stated that she felt she is being appropriately treated. I reiterated the pain management plan and disposition with the patient however, she was unwilling to stay and walked out of the ER AMA.       Additional Medical Decision Making     I have reviewed the vital signs and the nursing notes. Labs and radiology results that were available during my care of the patient were independently reviewed by me and considered in my medical decision making. I discussed the case with Dr. Lum Babe. I independently reviewed the radiology images.    Portions of this record have been created using Scientist, clinical (histocompatibility and immunogenetics). Dictation errors have been sought, but may not have been identified and corrected.  ____________________________________________     History     Chief Complaint  Hip Pain Non-Traumatic      HPI   Ryan Robinson is a 41 y.o. trans- male to male history of HIV, bipolar disorder, hypertension, L5/S1 fusion, avascular necrosis of the right hip, who presents with left hip pain. The pain started approximately 1 week ago and has been getting progressively worse. She describes it as a 9 out of 10 tightness. It occasionally radiates down the lateral left thigh into the left calf. The pain is better with standing and walking. She describes as a tightness returned after sitting still for about 2 hours which often awakes her from sleep.          Past Medical History:   Diagnosis Date   ??? ADHD (attention deficit hyperactivity disorder)    ??? Alcoholism (CMS-HCC)    ??? Anorexia nervosa    ??? Anxiety    ??? Bipolar disorder (CMS-HCC)    ??? Depression    ??? Depressive disorder    ??? HIV (human immunodeficiency virus infection) (CMS-HCC)    ??? Migraines    ??? Panic disorder    ??? R/O Borderline personality disorder 12/02/2015   ??? Seizures (CMS-HCC)    ??? Substance abuse    ??? Substance Use Treatment History        Patient Active Problem List   Diagnosis   ??? HIV (human immunodeficiency virus infection) (CMS-HCC)   ??? Major depression, recurrent, severe without psychosis   ??? Essential hypertension (RAF-HCC)   ??? Urethral stricture   ??? 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 Cigarette nicotine dependence without complication   ??? Bipolar disorder in partial remission (CMS-HCC)       Past Surgical History:   Procedure Laterality Date   ??? HIP  ARTHROPLASTY     ??? JOINT REPLACEMENT  2011    right hip replacement 2/2 AVN       No current facility-administered medications for this encounter.     Current Outpatient Prescriptions:   ???  albuterol (PROVENTIL HFA;VENTOLIN HFA) 90 mcg/actuation inhaler, Inhale 2 puffs every four (4) hours as needed for wheezing., Disp: 1 Inhaler, Rfl: 0  ???  buprenorphine-naloxone (SUBOXONE) 8-2 mg sublingual film, Place 2.5 tablets under the tongue daily., Disp: , Rfl:   ???  buPROPion (WELLBUTRIN XL) 150 MG 24 hr tablet, Take 450 mg by mouth daily., Disp: , Rfl:   ???  DESCOVY 200-25 mg tablet, TAKE 1 TABLET BY MOUTH ONCE DAILY, Disp: 30 tablet, Rfl: 11  ???  estradiol (ESTRACE) 1 MG tablet, Take 2 mg by mouth daily., Disp: , Rfl:   ??? gabapentin (NEURONTIN) 300 MG capsule, Take 1,200 mg by mouth Three (3) times a day., Disp: , Rfl:   ???  hydroCHLOROthiazide (HYDRODIURIL) 25 MG tablet, Take 25 mg by mouth every morning., Disp: , Rfl:   ???  ibuprofen (ADVIL,MOTRIN) 800 MG tablet, Take 800 mg by mouth daily as needed for pain., Disp: , Rfl:   ???  melatonin 3 mg Tab, Take 6 mg by mouth. , Disp: , Rfl:   ???  nicotine (NICODERM CQ) 21 mg/24 hr patch, Place 1 patch on the skin., Disp: , Rfl:   ???  spironolactone (ALDACTONE) 25 MG tablet, Take 100 mg by mouth Two (2) times a day. , Disp: , Rfl:   ???  tamsulosin (FLOMAX) 0.4 mg capsule, Take 0.4 mg by mouth daily., Disp: , Rfl:   ???  TIVICAY 50 mg Tab TABLET, TAKE 1 TABLET BY MOUTH ONCE DAILY, Disp: 30 tablet, Rfl: 11    Allergies  Abilify [aripiprazole]; Amoxicillin; Penicillins; Toradol [ketorolac]; and Ultram [tramadol]    Family History   Problem Relation Age of Onset   ??? Bipolar disorder Mother    ??? ADD / ADHD Mother    ??? Anxiety disorder Mother    ??? Depression Mother    ??? Drug abuse Mother    ??? Anxiety disorder Father    ??? Anxiety disorder Sister    ??? Bipolar disorder Sister    ??? Depression Sister    ??? Paranoid behavior Sister    ??? Seizures Sister    ??? Drug abuse Brother    ??? Bipolar disorder Maternal Aunt    ??? Depression Maternal Aunt    ??? Bipolar disorder Cousin    ??? Depression Cousin    ??? Drug abuse Cousin        Social History  Social History   Substance Use Topics   ??? Smoking status: Current Every Day Smoker     Packs/day: 0.25     Years: 20.00   ??? Smokeless tobacco: Never Used   ??? Alcohol use Yes      Comment: once in awhile       Review of Systems  All other systems been reviewed and are negative except as otherwise documented.  Constitutional: Negative for fever.  Eyes: Negative for visual changes.  ENT: Negative for sore throat.  Cardiovascular: Negative for chest pain.  Respiratory: Negative for shortness of breath.  Gastrointestinal: Negative for abdominal pain, vomiting or diarrhea. Genitourinary: Negative for dysuria.  Musculoskeletal: Negative for midline back pain  Skin: Negative for rash.  Neurological: Negative for headaches, focal weakness or numbness.    Physical Exam  ED Triage Vitals [01/02/17 1536]   Enc Vitals Group      BP 121/54      Pulse       SpO2 Pulse 92      Resp 16      Temp 36.6 ??C (97.9 ??F)      Temp src       SpO2 99 %      Weight       Height       Head Circumference       Peak Flow       Pain Score       Pain Loc       Pain Edu?       Excl. in GC?      Constitutional: Alert and oriented. Well appearing and in no distress.  Eyes: Conjunctivae are normal.  ENT       Head: Normocephalic and atraumatic.       Nose: No congestion.       Mouth/Throat: Mucous membranes are moist.       Neck: No stridor.  Hematological/Lymphatic/Immunilogical: No cervical lymphadenopathy.  Cardiovascular: Normal rate, regular rhythm. Normal and symmetric distal pulses are present in all extremities.  Respiratory: Normal respiratory effort. Breath sounds are normal.  Gastrointestinal: Soft and nontender. There is no CVA tenderness.  Musculoskeletal: No midline back pain. Reproducible pain with palpation in the area of piriformis muscle. Straight leg raise limited to about 30?? due to pain in that area. There is no shooting pain during exam. Pain is reproducible palpation in the area of the piriformis.         Right lower leg: No tenderness or edema.       Left lower leg: No tenderness or edema.  Neurologic: Normal speech and language. No gross focal neurologic deficits are appreciated.  Skin: Skin is warm, dry and intact. No rash noted.  Psychiatric: Mood and affect are normal. Speech and behavior are normal.         Tonna Boehringer, MD  Resident  01/03/17 0010

## 2017-01-02 NOTE — Unmapped (Signed)
L hip pain x 1 wk. NAD noted.

## 2017-01-02 NOTE — Unmapped (Signed)
Pt ambulatory to room w/o difficulty. Gait even and steady.  This RN introduced self to pt, acclimated pt to room and offered blanket for comfort. Call bell within reach, bed low and locked.

## 2017-01-03 NOTE — Unmapped (Signed)
Pt came back from XR and promptly wanted to leave. MD aware.

## 2017-01-03 NOTE — Unmapped (Signed)
Patient rounds completed. The following patient needs were addressed:  Plan of Care, Call Bell in Reach and Bed Position Low .

## 2017-01-03 NOTE — Unmapped (Signed)
Patient transported to X-ray  Transported by Radiology  How tranported Wheelchair  Cardiac Monitor no

## 2017-01-03 NOTE — Unmapped (Signed)
Not able to get discharge VS, papers.

## 2017-01-03 NOTE — Unmapped (Signed)
Patient returned from X-ray  Transported by Radiology  How tranported Wheelchair  Cardiac Monitor no

## 2017-01-06 NOTE — Unmapped (Signed)
REASON: Health Maintenance    ASSESSMENT: Patient called to let nurse know she wouldn't be able to come to clinic due to leg pain.    ADHERENCE: 100%    INTERVENTION:  Reviewed pain with patient to assess for ?clot in leg.  Patient states pain has gone from hip area down to knee-calf area.  No redness, swelling or warmth to the touch noted in any area on leg.  Advised patient to take Ibuprofen (if tolerated) use ice, heat and keep leg rested for a few days.  If pain becomes worse, patient to let nurse know.  Patient verbalized understanding.    PLAN: Will follow up with patient this Friday, 8/24.    CONTACT DURATION: 15 minutes

## 2017-01-06 NOTE — Unmapped (Signed)
REASON: Health Maintenance    ASSESSMENT: Patient called extremely upset with an employee in the radiology department.  Patient states she went to ED on 01/02/17 for increasing sciatica pain.  ED ordered radiologic exams for patient.  When she went to get these exams done, one of the staff members referred to her as he.  Patient turned to look at the staff member and he then said he, she, whatever!  Patient was so upset she left before she got her exams completed.  Patient stated that this is why she doesn't like to go get care anywhere but here because she only feels comfortable here.    ADHERENCE: 100%    INTERVENTION:  Gave patient options for this incident.  Patient should go to Patient Relations and make a formal complaint on this person.  I let patient know I would go with her to patient relations and she was very appreciative.  I will also let her provider, Dr. Learta Codding know.    PLAN:  Will follow up with patient when she comes to clinic or sooner if needed.     CONTACT DURATION: 25 minutes

## 2017-01-13 MED FILL — TAMSULOSIN/0.4MG/CAP: TAMSULOSIN/0.4MG/CAP | 30 days supply | Qty: 30 | Fill #10

## 2017-01-13 MED FILL — HYDROCHLOROTHIAZID/25MG/TABS: HYDROCHLOROTHIAZID/25MG/TABS | 30 days supply | Qty: 30 | Fill #10

## 2017-01-13 MED FILL — TIVICAY/50MG/TAB: TIVICAY/50MG/TAB | 30 days supply | Qty: 30 | Fill #3

## 2017-01-13 MED FILL — DESCOVY/200MG/25MG/TABS: DESCOVY/200MG/25MG/TABS | 30 days supply | Qty: 30 | Fill #3

## 2017-01-13 MED FILL — GABAPENTIN/300MG/CAPS: GABAPENTIN/300MG/CAPS | 30 days supply | Qty: 360 | Fill #10

## 2017-01-13 MED FILL — BUPROPION HCL XL/150MG/TAB: BUPROPION HCL XL/150MG/TAB | 30 days supply | Qty: 90 | Fill #10

## 2017-01-13 MED FILL — VENTOLIN HFA/90MCG/AER: VENTOLIN HFA/90MCG/AER | 17 days supply | Qty: 1 | Fill #10

## 2017-01-13 NOTE — Unmapped (Signed)
[facility] Refill and Clinical Coordination Note  Medication(s): Tivicay, Descovy, gabapentin, tamsulosin, bupropion, ventolin, hctz    Ryan Robinson, DOB: [birth year]  Phone: [phone] (home) , Alternate phone contact: N/A  Shipping address: [address]   [place] [address]  Phone or address changes today?: No  All above HIPAA information verified.  Insurance changes? No    Completed refill and clinical call assessment today to schedule patient's medication shipment from the [facility] [phone]).      MEDICATION RECONCILIATION    Confirmed the medication and dosage are correct and have not changed: Yes, regimen is correct and unchanged.    Were there any changes to your medication(s) in the past month:  No, there are no changes reported at this time.    ADHERENCE    Is this medicine transplant or covered by Medicare Part B? No.    Did you miss any doses in the past 4 weeks? No missed doses reported.  Adherence counseling provided? Not needed     SIDE EFFECT MANAGEMENT    Are you tolerating your medication?:  North reports tolerating the medication.  Side effect management discussed: None      Therapy is appropriate and should be continued.    Evidence of clinical benefit: See Epic note from 12/10/16      FINANCIAL/SHIPPING    Delivery Scheduled: Yes, Expected medication delivery date: Friday, Aug 31   Additional medications refilled: No additional medications/refills needed at this time.    Ryan Robinson did not have any additional questions at this time.    Delivery address validated in FSI scheduling system: Yes, address listed above is correct.      We will follow up with patient monthly for standard refill processing and delivery.      Thank you,  Tawanna Solo Shared [facility] Specialty Pharmacist

## 2017-01-14 NOTE — Unmapped (Signed)
error 

## 2017-01-15 NOTE — Unmapped (Signed)
Called pt regarding HMAP Summer renewal. Left following voicemail message:     It appears that you have not completed the summer renewal application that is due to expire 02/14/17 and now you are at risk of experiencing a lapse in medication coverage. Renewals were due August 17th.  Please come to the clinic to complete your renewal application Monday-Friday 8:30-4:30 and asked for a financial counselor.  If you have any questions, please return my call at  [phone].     Cristina Gong  MSW Intern

## 2017-01-22 NOTE — Unmapped (Signed)
A financial assessment was completed to determine need and eligibility for patient financial assistance programs.    Household FPL: 86%  Individual FPL: 86%    Current Insurance status:   Medicare A B & D     Patient is currently enrolled in the following financial support programs (check all that apply):  HMAP (State HIV Medication Assistance Program)- Formerly ADAP and      SPAP subprogram (Copay assistance for Medicare Drug Plans or Medicare Advantage Plans)    Patient may qualify for other financial support programs (check all that apply):  [facility]    Interventions Completed (check all that apply):  [employer] Program Eligibility Started - , [employer] entered for individual FPL <300% and HMAP Application Completed    For uninsured patients:  Systems analyst completed: not applicable    Outcome of Diplomatic Services operational officer (check all that apply):  Not applicable       Comments and Follow up plan:         Time Duration of intervention in minutes: 15 mins

## 2017-01-25 NOTE — Unmapped (Signed)
Note reviewed and approved by Sherene Sires, Financial & Benefits Coordinator.     Sherene Sires.

## 2017-02-02 MED ORDER — HYDROCHLOROTHIAZIDE 25 MG TABLET: tablet | 9 refills | 0 days

## 2017-02-02 MED ORDER — BUPROPION HCL XL 150 MG 24 HR TABLET, EXTENDED RELEASE: tablet | 9 refills | 0 days

## 2017-02-02 MED ORDER — HYDROCHLOROTHIAZIDE 25 MG TABLET
ORAL_TABLET | 9 refills | 0.00000 days
Start: 2017-02-02 — End: 2017-02-02

## 2017-02-02 MED ORDER — ALBUTEROL SULFATE HFA 90 MCG/ACTUATION AEROSOL INHALER
RESPIRATORY_TRACT | 1 refills | 0 days | Status: SS
Start: 2017-02-02 — End: 2018-02-23

## 2017-02-02 MED ORDER — TAMSULOSIN 0.4 MG CAPSULE: capsule | 9 refills | 0 days | Status: SS

## 2017-02-02 MED ORDER — BUPROPION HCL XL 150 MG 24 HR TABLET, EXTENDED RELEASE
ORAL_TABLET | 9 refills | 0.00000 days | Status: SS
Start: 2017-02-02 — End: 2017-02-02

## 2017-02-02 MED ORDER — GABAPENTIN 300 MG CAPSULE: capsule | 9 refills | 0 days

## 2017-02-02 MED ORDER — GABAPENTIN 300 MG CAPSULE
ORAL_CAPSULE | 9 refills | 0.00000 days
Start: 2017-02-02 — End: 2018-02-22

## 2017-02-02 MED ORDER — TAMSULOSIN 0.4 MG CAPSULE
ORAL_CAPSULE | ORAL | 9 refills | 0.00000 days | Status: SS
Start: 2017-02-02 — End: 2018-02-23

## 2017-02-02 NOTE — Unmapped (Signed)
[facility] Refill Coordination Note  Specialty Medication(s): GABAPENTIN- NEED REFILL  HCTZ- NEEDS REFILLS  DESCOVY  BUPROPRION- NEED REFILL  TIVICAY  TAMSULOSIN- NEEDS REFILL  VENTOLIN - NEEDS REFILLS      Ryan Robinson, DOB: 01-17-1976  Phone: [phone] (home) , Alternate phone contact: N/A  Phone or address changes today?: No  All above HIPAA information was verified with patient.  Shipping Address: [address] Ryan Robinson [place] [address]   Insurance changes? No    Completed refill call assessment today to schedule patient's medication shipment from the [facility] [phone]).      Confirmed the medication and dosage are correct and have not changed: Yes, regimen is correct and unchanged.    Confirmed patient started or stopped the following medications in the past month:  No, there are no changes reported at this time.    Are you tolerating your medication?:  Ryan Robinson reports tolerating the medication.    ADHERENCE        Did you miss any doses in the past 4 weeks? No missed doses reported.    FINANCIAL/SHIPPING    Delivery Scheduled: Yes, Expected medication delivery date: 02/10/17     Ryan Robinson did not have any additional questions at this time.    Delivery address validated in FSI scheduling system: Yes, address listed in FSI is correct.    We will follow up with patient monthly for standard refill processing and delivery.      Thank you,  Ryan Robinson   [facility] Specialty Technician

## 2017-02-08 MED FILL — TAMSULOSIN/0.4MG/CAP: TAMSULOSIN/0.4MG/CAP | 30 days supply | Qty: 30 | Fill #0

## 2017-02-08 MED FILL — TIVICAY/50MG/TAB: TIVICAY/50MG/TAB | 30 days supply | Qty: 30 | Fill #4

## 2017-02-08 MED FILL — BUPROPION HCL XL/150MG/TAB: BUPROPION HCL XL/150MG/TAB | 30 days supply | Qty: 90 | Fill #0

## 2017-02-08 MED FILL — DESCOVY/200MG/25MG/TABS: DESCOVY/200MG/25MG/TABS | 30 days supply | Qty: 30 | Fill #4

## 2017-02-08 MED FILL — VENTOLIN HFA/90MCG/AER: VENTOLIN HFA/90MCG/AER | 25 days supply | Qty: 1 | Fill #0

## 2017-02-08 MED FILL — GABAPENTIN/300MG/CAPS: GABAPENTIN/300MG/CAPS | 30 days supply | Qty: 360 | Fill #0

## 2017-02-08 MED FILL — HYDROCHLOROTHIAZID/25MG/TABS: HYDROCHLOROTHIAZID/25MG/TABS | 30 days supply | Qty: 30 | Fill #0

## 2017-02-09 NOTE — Unmapped (Signed)
Left discreet message for patient to call this writer back.  Carley Hammed, RN

## 2017-02-15 NOTE — Unmapped (Signed)
Duration of Intervention: 15 minutes    SW met with pt with nurse Carley Hammed to introduce self as resource. Pt reported difficulties with housing recently. Pt is third on waitlist for TBRA voucher with [facility] however she has found an apartment with [employer] for $425 per month but needs to either make at least $900/mo income or have voucher. Pt reported the apartment will be available on November 1st and she is the first on the waitlist and will lose it if she doesn't get the voucher secured in time. Pt reported [facility] has shown her around apartments and has been working with her for a long time. Pt expressed she would like to change her name legally to [employer]. Pt obtained [facility] legal phone number from [place]. SW will do research on how to change name with [facility] where pt was born.     SW called [facility] who stated nothing can be done to move pt up waitlist. Wanda Plump stated she sent SW Alvino Chapel an email to inquire about transgender housing resources last week. SW does not know of any but will research.     Bradly Bienenstock MSW, CHES
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
## 2017-03-08 MED FILL — TIVICAY/50MG/TAB: TIVICAY/50MG/TAB | 30 days supply | Qty: 30 | Fill #5

## 2017-03-08 MED FILL — GABAPENTIN/300MG/CAPS: GABAPENTIN/300MG/CAPS | 30 days supply | Qty: 360 | Fill #1

## 2017-03-08 MED FILL — DESCOVY/200MG/25MG/TABS: DESCOVY/200MG/25MG/TABS | 30 days supply | Qty: 30 | Fill #5

## 2017-03-08 MED FILL — HYDROCHLOROTHIAZID/25MG/TABS: HYDROCHLOROTHIAZID/25MG/TABS | 30 days supply | Qty: 30 | Fill #1

## 2017-03-08 MED FILL — BUPROPION HCL XL/150MG/TB24: BUPROPION HCL XL/150MG/TB24 | 30 days supply | Qty: 90 | Fill #1

## 2017-03-08 MED FILL — VENTOLIN HFA/90MCG/AER: VENTOLIN HFA/90MCG/AER | 25 days supply | Qty: 1 | Fill #1

## 2017-03-08 MED FILL — TAMSULOSIN/0.4MG/CAP: TAMSULOSIN/0.4MG/CAP | 30 days supply | Qty: 30 | Fill #1

## 2017-03-08 NOTE — Unmapped (Signed)
[facility] Refill Coordination Note  Medication: GABAPENTIN  TAMSULOSIN  TIVICAY  DESCOVY  HCTZ  VENTOLIN  BURPOPRION    Unable to reach patient to schedule shipment for medication being filled at [facility]. Left voicemail on phone.  As this is the 3rd unsuccessful attempt to reach the patient, no additional phone call attempts will be made at this time.      Phone numbers attempted: [phone]- EC WILL TXT PT TO CALL us   [phone]    Last scheduled delivery: SHIPPED 01/29/17    Please call the [facility] at [phone] (option 4) should you have any further questions.      Thanks,  [facility] Specialty Team

## 2017-03-09 NOTE — Unmapped (Signed)
Called pt for medication coordination as [place] SS has been unsuccessful in reaching pt.    Left vm for pt with [facility] pharmacy number    Sherilynn Phen
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
## 2017-03-16 NOTE — Unmapped (Signed)
Attempt to reach patient times 2 today;  Left message with emergency contact, Ron, to have her give me a call if and when he sees her.  He agreed.  Carley Hammed, RN

## 2017-03-18 NOTE — Unmapped (Signed)
Attempted to reach patient via telephone:  Left confidential message with my office phone number.  Carley Hammed, RN

## 2017-03-29 NOTE — Unmapped (Signed)
Attempted to call patient;  Left discreet message for patient to call back.  Carley Hammed, RN

## 2017-03-30 NOTE — Unmapped (Signed)
Patient struggling at this time with housing and staying sober.  Will continue to reach out to patient for medication adherence, keeping appointments and responding to phone calls.   Carley Hammed, RN

## 2017-04-14 MED FILL — DESCOVY/200MG/25MG/TABS: DESCOVY/200MG/25MG/TABS | 30 days supply | Qty: 30 | Fill #6

## 2017-04-14 MED FILL — TIVICAY/50MG/TAB: TIVICAY/50MG/TAB | 30 days supply | Qty: 30 | Fill #6

## 2017-04-14 MED FILL — BUPROPION HCL XL/150MG/TB24: BUPROPION HCL XL/150MG/TB24 | 30 days supply | Qty: 90 | Fill #2

## 2017-04-14 MED FILL — TAMSULOSIN/0.4MG/CAP: TAMSULOSIN/0.4MG/CAP | 30 days supply | Qty: 30 | Fill #2

## 2017-04-14 MED FILL — GABAPENTIN/300MG/CAPS: GABAPENTIN/300MG/CAPS | 30 days supply | Qty: 360 | Fill #2

## 2017-04-14 MED FILL — HYDROCHLOROTHIAZID/25MG/TABS: HYDROCHLOROTHIAZID/25MG/TABS | 30 days supply | Qty: 30 | Fill #2

## 2017-04-14 NOTE — Unmapped (Signed)
[facility] Refill Coordination Note  Specialty Medication(s): GABAPENTIN  DESCOVY  BUPROPION  TIVICAY  HCTZ  TAMSULOSIN      Ryan Robinson, DOB: 23-Feb-1976  Phone: [phone] (home) , Alternate phone contact: N/A  Phone or address changes today?: No  All above HIPAA information was verified with patient.  Shipping Address: [address] [name] [address]   Insurance changes? No    Completed refill call assessment today to schedule patient's medication shipment from the [facility] [phone]).      Confirmed the medication and dosage are correct and have not changed: Yes, regimen is correct and unchanged.    Confirmed patient started or stopped the following medications in the past month:  No, there are no changes reported at this time.    Are you tolerating your medication?:  Ryan Robinson reports tolerating the medication.    ADHERENCE        Did you miss any doses in the past 4 weeks? No missed doses reported.    FINANCIAL/SHIPPING    Delivery Scheduled: Yes, Expected medication delivery date: 04/16/17     Ryan Robinson did not have any additional questions at this time.    Delivery address validated in [facility] scheduling system: Yes, address listed in [facility] is correct.    We will follow up with patient monthly for standard refill processing and delivery.      Thank you,  Westley Gambles   [facility] Specialty Technician

## 2017-04-14 NOTE — Unmapped (Signed)
[facility] Refill Coordination Note  Medication: GABAPENTIN  VENTOLIN  DESCOVY  BUPROPION  TIVICAY  HCTZ  TAMSULOSIN    Unable to reach patient to schedule shipment for medication being filled at [facility]. Left voicemail on phone.  As this is the 3rd unsuccessful attempt to reach the patient, no additional phone call attempts will be made at this time.      Phone numbers attempted: [phone]:: DOES NOT WANT [place] TO CALL ANY OTHER NUMBERS  Last scheduled delivery: SHIPPED 03/08/17    Please call the [facility] at [phone] (option 4) should you have any further questions.      Thanks,  [facility] Specialty Team

## 2017-04-29 ENCOUNTER — Emergency Department
Admission: EM | Admit: 2017-04-29 | Discharge: 2017-04-29 | Discharge status: Against Medical Advice | Payer: MEDICARE | Source: Intra-hospital

## 2017-04-29 ENCOUNTER — Emergency Department
Admission: EM | Admit: 2017-04-29 | Discharge: 2017-04-29 | Discharge status: Against Medical Advice | Source: Intra-hospital

## 2017-04-29 DIAGNOSIS — R1310 Dysphagia, unspecified: Secondary | ICD-10-CM

## 2017-04-29 DIAGNOSIS — R0989 Other specified symptoms and signs involving the circulatory and respiratory systems: Principal | ICD-10-CM

## 2017-04-29 LAB — BUN / CREAT RATIO: Urea nitrogen/Creatinine:MRto:Pt:Ser/Plas:Qn:: 10

## 2017-04-29 LAB — CBC W/ AUTO DIFF
EOSINOPHILS ABSOLUTE COUNT: 0.2 10*9/L (ref 0.0–0.4)
HEMATOCRIT: 43.3 % (ref 41.0–53.0)
HEMOGLOBIN: 15.1 g/dL (ref 13.5–17.5)
LARGE UNSTAINED CELLS: 1 % (ref 0–4)
MEAN CORPUSCULAR HEMOGLOBIN CONC: 34.7 g/dL (ref 31.0–37.0)
MEAN CORPUSCULAR HEMOGLOBIN: 33.2 pg (ref 26.0–34.0)
MEAN CORPUSCULAR VOLUME: 95.6 fL (ref 80.0–100.0)
MONOCYTES ABSOLUTE COUNT: 0.2 10*9/L (ref 0.2–0.8)
NEUTROPHILS ABSOLUTE COUNT: 13.8 10*9/L — ABNORMAL HIGH (ref 2.0–7.5)
PLATELET COUNT: 325 10*9/L (ref 150–440)
RED BLOOD CELL COUNT: 4.53 10*12/L (ref 4.50–5.90)
RED CELL DISTRIBUTION WIDTH: 13.1 % (ref 12.0–15.0)
WBC ADJUSTED: 15.5 10*9/L — ABNORMAL HIGH (ref 4.5–11.0)

## 2017-04-29 LAB — BASIC METABOLIC PANEL
ANION GAP: 14 mmol/L (ref 9–15)
BLOOD UREA NITROGEN: 8 mg/dL (ref 7–21)
CALCIUM: 9.2 mg/dL (ref 8.5–10.2)
CREATININE: 0.77 mg/dL (ref 0.70–1.30)
EGFR MDRD AF AMER: >=60 mL/min/{1.73_m2} (ref >=60)
EGFR MDRD NON AF AMER: >=60 mL/min/{1.73_m2} (ref >=60)
GLUCOSE RANDOM: 133 mg/dL (ref 65–179)
POTASSIUM: 4 mmol/L (ref 3.5–5.0)
SODIUM: 135 mmol/L (ref 135–145)

## 2017-04-29 LAB — MEAN CORPUSCULAR HEMOGLOBIN CONC: Lab: 34.7

## 2017-04-29 NOTE — Unmapped (Signed)
Pt taken to ct scan.

## 2017-04-29 NOTE — Unmapped (Signed)
Pt awake, alert,nad. Pt reports he can't sleep. Pt not gagging, coughing, pt not spitting. Ct called to ascertain pt status, will get to pt asap.
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
## 2017-04-29 NOTE — Unmapped (Signed)
Patient rounds completed. The following patient needs were addressed:  Pain, Toileting, Personal Belongings, Plan of Care, Call Bell in Reach and Bed Position Low .

## 2017-04-29 NOTE — Unmapped (Signed)
Pt awake, alert, nad. Pt states <  Im ready to leave! pt asked, denies si.

## 2017-04-29 NOTE — Unmapped (Signed)
Pt signed out by Prya rn. Per report, pt had difficulty in talking on the phoon. Pt advisd to dial 911 if he can not tlk or stridor ( barking sel.  pt ambulating out of er

## 2017-04-29 NOTE — Unmapped (Signed)
Rt ac iv clotted, attempt to flush, unable to flush. Iv # 20 started in rt hand wo difficulty. On secondary view, iv in rt ac catheter kinked, bent, repositioned. Iv now flushes wo difficulty.

## 2017-04-29 NOTE — Unmapped (Signed)
ENT at bedside for scope.  Patient tolerating well, airway remaining patent.

## 2017-04-29 NOTE — Unmapped (Signed)
Phone call from ct scan - pt reportedly refused ct scan nad iv contrast in ct scan,. Pt returned. Pt asked, states s/he refused bc she didn't want to be exposed  to all that radiation. md aware.

## 2017-04-29 NOTE — Unmapped (Signed)
Assumed pt care, pt w a reported fo in airway= per report= no fo? Pt awake,alert, nad. Pt airway clear= pt keeps spitting saliva. No respiratory distress. Lungs clear= bta. Pt denies cp( hx of crack use). Pt pending observation, ct scan, ent dispo.

## 2017-04-29 NOTE — Unmapped (Signed)
CT NOTES      READY 8:50 Labs pending 20g rt hand  prefers [name]

## 2017-04-29 NOTE — Unmapped (Signed)
Pt seenm by rt, given  lidc

## 2017-04-29 NOTE — Unmapped (Signed)
Late note= pt spoken to by dr Almedia Balls.pt advised of the need for labs due to ct. Pt agrees.

## 2017-04-29 NOTE — Unmapped (Signed)
Pt states he does not want to stay. Pt spoken o by dr Leonarda Salon & dr Myrtis Ser- er attending. Pt still wants to leave . Pt awake, alert, oriented x3. Pt advised to seek med attention for cp, sob, cant talk, stridor. Call 911, fu w pcp.

## 2017-04-29 NOTE — Unmapped (Signed)
Patient taking in lido neb, ativan given, x-rays done at bedside.  VSS.  Airway patent.

## 2017-04-29 NOTE — Unmapped (Signed)
Pt AOx4 and capable of making her own decisions. Witnessed by [employer]
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
## 2017-04-29 NOTE — Unmapped (Signed)
Went to draw bloods, iv in rt ac clotted= unable to flush. Pt advised blood draw needed. Pt refuses/ declines blood draw. Pt states  I m  Choking.. pt talking , in nad. Spitting saliva.

## 2017-04-29 NOTE — Unmapped (Signed)
ED Progress Note    7:00 AM    Signout received from prior provider, Dr. Bland Span.  Briefly patient is a 41 year old male history of HIV who is presenting for evaluation of sensation of foreign body in the back of her throat.  Was scoped by ENT has a 2 x 2 millimeter round circle in the back of her throat.  She was scoped by ENT, they wanted a CT to ensure there was no retained foreign body.  Will obtain basic labs, obtain CT and likely be rescoped by ENT.    11:41 AM  Patient is asking to leave.  Discussed extensively with the patient the risks of a possible retained foreign body.  However she was to like to go.  She wants to sign out AGAINST MEDICAL ADVICE.  Discussed risk factors including retained foreign body, future respiratory distress, issues with swallowing, including death.  She will still sign out AGAINST MEDICAL ADVICE.  Discussed with ENT briefly, will arrange follow-up for repeat scope.  Return precautions given.

## 2017-04-30 ENCOUNTER — Emergency Department
Admission: EM | Admit: 2017-04-30 | Discharge: 2017-05-01 | Disposition: A | Discharge status: Home / Self Care | Source: Intra-hospital

## 2017-04-30 ENCOUNTER — Emergency Department
Admission: EM | Admit: 2017-04-30 | Discharge: 2017-05-01 | Disposition: A | Discharge status: Home / Self Care | Payer: MEDICARE | Source: Intra-hospital

## 2017-04-30 DIAGNOSIS — J029 Acute pharyngitis, unspecified: Principal | ICD-10-CM

## 2017-04-30 MED ORDER — METHYLPREDNISOLONE 4 MG TABLETS IN A DOSE PACK
PACK | 0 refills | 0 days | Status: CP
Start: 2017-04-30 — End: 2017-05-01

## 2017-05-01 MED ORDER — METHYLPREDNISOLONE 4 MG TABLETS IN A DOSE PACK
PACK | 0 refills | 0 days | Status: CP
Start: 2017-05-01 — End: 2017-08-09

## 2017-05-01 MED ORDER — OXYCODONE-ACETAMINOPHEN 5 MG-325 MG TABLET
ORAL_TABLET | ORAL | 0 refills | 0 days | Status: CP | PRN
Start: 2017-05-01 — End: 2017-05-01

## 2017-05-01 MED ORDER — MAGIC MOUTHWASH ORAL SUSPENSION
Freq: Four times a day (QID) | ORAL | 0 refills | 0 days | Status: CP | PRN
Start: 2017-05-01 — End: 2017-08-09
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
## 2017-05-01 NOTE — Unmapped (Signed)
Pt was here yestyerday for same problem/ sore throat. Left ama yesterday. Came back bc states she is hiv+ and unable to take her meds bc too difficult/painful to swallow.

## 2017-05-01 NOTE — Unmapped (Signed)
ENT at bedside. Scoped pt.
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
## 2017-05-01 NOTE — Unmapped (Signed)
[place] IV placed.   Pt crying in pain.  MD aware.

## 2017-05-04 NOTE — Unmapped (Signed)
I have tried contacting the pt on 12/14, 12/17, and 12/18 and left a vm for the pt to contact our office to schedule.      ===View-only below this line===    ----- Message -----  From: Lionel December, MD  Sent: 04/29/2017   4:37 PM  To: Cindy Hazy, CNA  Subject: follow up                                        Can you please schedule this patient for resident clinic (mine if possible) in the next 1-2 weeks

## 2017-05-04 NOTE — Unmapped (Signed)
Called [facility] in [place] to see if patient had come for any appointments.  Refer to note from this date later in EPIC.   Carley Hammed, RN
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
## 2017-05-06 NOTE — Unmapped (Signed)
Acknowledged, thank you. OK to close encounter.  [facility]

## 2017-05-12 NOTE — Unmapped (Unsigned)
[facility] Refill Coordination Note  Medication: GABAPENTIN  FLOMAX  DESCOVY  HCTZ  TIVICAY  WELBUTRIN  VENTOLIN? NO REFILL- PT SHOULD MAKE APPT IF NOT ALREADY SCHEDULED     Unable to reach patient to schedule shipment for medication being filled at [facility]. Left voicemail on phone.  As this is the 3rd unsuccessful attempt to reach the patient, no additional phone call attempts will be made at this time.      Phone numbers attempted: [phone]- PT DOES NOT WANT [place] TO CONTACT HER ANY OTHER WAY  Last scheduled delivery: SHIPPED 04/14/17      Please call the [facility] at [phone] (option 4) should you have any further questions.      Thanks,  [facility] Specialty Team

## 2017-05-17 NOTE — Unmapped (Signed)
[facility] Refill Coordination Note    Specialty Program and Medication(s) to be Shipped:   Infectious diseases TIVICAY DESCOVY   Other medications to be shipped: GABAPENTIN, TAMSULOSIN, HCTZ, BUPROPION, VENTOLIN     Ryan Robinson, DOB: 07-01-1975  Phone: [phone] (home)   Shipping Address: [address]  [place] [address]  All above HIPAA information was verified with patient.     Completed refill call assessment today to schedule patient's medication shipment from the [facility] [phone]).       Medications reviewed and verified:      Specialty medication(s) and dose(s) confirmed: yes  Changes to medications: no  Changes to insurance: no  Tolerating medications:      DISEASE-SPECIFIC INFORMATION        N/A    ADHERENCE     Medication Adherence    Patient Reported X Missed Doses in the Last Month:  0  Specialty Medication:  Karie Chimera # 4 DAYS   Patient is on additional specialty medications:  Yes  Additional specialty medications:  DESCOVY QOH # 4 DAYS   Patient Reported Additional Medication X Missed Doses in the Last Month:  0  Informant:  patient  Confirmed Plan for Next Specialty Medication Refill:  delivery by pharmacy  Refills Needed for Supportive Medications:  yes, ordered or provider notified  Medication Assistance Program  Refill Coordination  Has the Patients' Contact Information Changed:  No    Is the Shipping Address Different:  No    Shipping Information  Delivery Scheduled:  Yes  Delivery Date:  05/20/17  Medications to be Shipped:  TIVICAY DESCOVY GABAPENTIN TAMSULOSIN HYDROCHLOROTHIAZIDE BUPROPION VENTOLIN       (change to the new smartlinks)    (Below is required for Medicare Part B billed medications only - per drug):   Quantity dispensed last month: N/A  Remaining supply on hand: 4 day(s).      SHIPPING     Delivery Scheduled: yes, Expected medication delivery date: 05/20/17      Jolene Schimke  [facility]

## 2017-05-18 MED FILL — HYDROCHLOROTHIAZID/25MG/TABS: HYDROCHLOROTHIAZID/25MG/TABS | 30 days supply | Qty: 30 | Fill #3

## 2017-05-18 MED FILL — TIVICAY/50MG/TAB: TIVICAY/50MG/TAB | 30 days supply | Qty: 30 | Fill #7

## 2017-05-18 MED FILL — GABAPENTIN/300MG/CAPS: GABAPENTIN/300MG/CAPS | 30 days supply | Qty: 360 | Fill #3

## 2017-05-18 MED FILL — BUPROPION HCL XL/150MG/TB24: BUPROPION HCL XL/150MG/TB24 | 30 days supply | Qty: 90 | Fill #3

## 2017-05-18 MED FILL — TAMSULOSIN/0.4MG/CAP: TAMSULOSIN/0.4MG/CAP | 30 days supply | Qty: 30 | Fill #3

## 2017-05-18 MED FILL — DESCOVY/200MG/25MG/TABS: DESCOVY/200MG/25MG/TABS | 30 days supply | Qty: 30 | Fill #7

## 2017-05-19 NOTE — Unmapped (Signed)
Left another message for patient to reach out to me here at clinic.  Stated on message we are here to help, not judge nor condemn.  Asked her at least to leave a message letting us know she is ok and that she needs to make a follow up appointment.   Carley Hammed, RN

## 2017-05-20 NOTE — Unmapped (Signed)
Acknowledged, thanks. OK to close encounter.  CBH

## 2017-06-15 NOTE — Unmapped (Signed)
[facility] Refill and Clinical Coordination Note  Medication(s): Tivicay,bupropion, hctz,gabapentin,tamsulosin,descovy    Ryan Robinson, DOB: 1975-09-18  Phone: [phone] (home) , Alternate phone contact: N/A  Shipping address: [address]  [place] [address]  Phone or address changes today?: No  All above HIPAA information verified.  Insurance changes? No    Completed refill and clinical call assessment today to schedule patient's medication shipment from the [facility] [phone]).      MEDICATION RECONCILIATION    Confirmed the medication and dosage are correct and have not changed: Yes, regimen is correct and unchanged.    Were there any changes to your medication(s) in the past month:  No, there are no changes reported at this time.    ADHERENCE    Is this medicine transplant or covered by Medicare Part B? No.        Did you miss any doses in the past 4 weeks? No missed doses reported.  Adherence counseling provided? Not needed     SIDE EFFECT MANAGEMENT    Are you tolerating your medication?:  Ryan Robinson reports tolerating the medication.  Side effect management discussed: None      Therapy is appropriate and should be continued.          FINANCIAL/SHIPPING    Delivery Scheduled: Yes, Expected medication delivery date: 06/17/17   Additional medications refilled: No additional medications/refills needed at this time.    Ryan Robinson did not have any additional questions at this time.    Delivery address validated in FSI scheduling system: Yes, address listed above is correct.      We will follow up with patient monthly for standard refill processing and delivery.      Thank you,  Ryan Robinson   [facility] Shared [facility] Specialty Pharmacist

## 2017-06-15 NOTE — Unmapped (Signed)
REASON: Health Maintenance    ASSESSMENT: Patient called this writer in clinic after not being able to reach her for some weeks now.  Patient seemed annoyed at her life and was talking about how hard her struggle had been.   She is living at the same place, but feels safer there than she had.  States her s/o, Ron. Is not as helpful as she had hoped.   She is concerned with finances, stating that her social security check barely pays her rent.   She feels abandoned by some of her friends.  She has used drugs a few times.    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  Let her know she can come in and talk to either this Clinical research associate or SW.  Advised her she needs to be seen by March 2019 to stay in care.    PLAN: Will continue to call patient bi-weekly.    CONTACT DURATION: 15 minutes.

## 2017-06-16 MED FILL — TIVICAY/50MG/TAB: TIVICAY/50MG/TAB | 30 days supply | Qty: 30 | Fill #8

## 2017-06-16 MED FILL — TAMSULOSIN/0.4MG/CAP: TAMSULOSIN/0.4MG/CAP | 30 days supply | Qty: 30 | Fill #4

## 2017-06-16 MED FILL — BUPROPION HCL XL/150MG/TB24: BUPROPION HCL XL/150MG/TB24 | 30 days supply | Qty: 90 | Fill #4

## 2017-06-16 MED FILL — DESCOVY/200MG/25MG/TABS: DESCOVY/200MG/25MG/TABS | 30 days supply | Qty: 30 | Fill #8

## 2017-06-16 MED FILL — HYDROCHLOROTHIAZID/25MG/TABS: HYDROCHLOROTHIAZID/25MG/TABS | 30 days supply | Qty: 30 | Fill #4

## 2017-06-16 MED FILL — GABAPENTIN/300MG/CAPS: GABAPENTIN/300MG/CAPS | 30 days supply | Qty: 360 | Fill #4

## 2017-07-19 NOTE — Unmapped (Signed)
[facility] Refill Coordination Note  Medication: Tivicay and Descovy    Unable to reach patient to schedule shipment for medication being filled at [facility]. Left voicemail on phone.  As this is the 3rd unsuccessful attempt to reach the patient, no additional phone call attempts will be made at this time.      Phone numbers attempted: [phone]  Last scheduled delivery: 06/17/17    Please call the [facility] at [phone] (option [phone]) should you have any further questions.      Thanks,  [facility] Shared [facility] Specialty Team

## 2017-07-20 NOTE — Unmapped (Signed)
[facility] Refill and Clinical Coordination Note: HIV     HIV Medication(s): Descovy and Tivicay  Additional Medication(s): tamsulosin, gabapentin, hydrochlorothiazide, and bupropion    Ryan Robinson, DOB: March 23, 1976  Phone: [phone] (home) , Alternate phone contact: N/A  Shipping address: [address]  [place] [address]  Phone or address changes today?: No  All above HIPAA information verified.  Insurance changes? No    Completed refill and clinical call assessment today to schedule patient's medication shipment from the [facility] [phone]).      MEDICATION RECONCILIATION    Confirmed the medication and dosage are correct and have not changed: Yes, regimen is correct and unchanged.    Were there any changes to your medication(s) in the past month:  No, there are no changes reported at this time.    HIV-related labs:  Lab Results   Component Value Date/Time    HIVRS Not Detected 11/27/2016 02:53 PM    HIVRS Detected (A) 07/18/2016 07:57 AM    HIVRS Not Detected 10/11/2015 02:51 PM    HIVCP <40 (H) 07/18/2016 07:57 AM    HIVCP 5614 08/13/2014 05:24 AM    HIV10  07/18/2016 07:57 AM      Comment:      <1.6 log    HIV10 3.75 08/13/2014 05:24 AM    HIVCM  11/27/2016 02:53 PM      Comment:      HIV-1 quantification by real-time RT-PCR is performed using the Abbott RealTime  HIV-1 test. This test is FDA approved and can quantitate HIV-1 RNA over the  range of 40 - 1,000,000 copies/mL (1.60 log(10) -6.00 log(10) copies/mL). The reference range for this assay is Not Detected.    HIVCM  07/18/2016 07:57 AM      Comment:      HIV-1 quantification by real-time RT-PCR is performed using the Abbott RealTime  HIV-1 test. This test is FDA approved and can quantitate HIV-1 RNA over the  range of 40 - 1,000,000 copies/mL (1.60 log(10) -6.00 log(10) copies/mL). The reference range for this assay is Not Detected.    HIVCM  10/11/2015 02:51 PM      Comment: HIV-1 quantification by real-time RT-PCR is performed using the Abbott RealTime  HIV-1 test. This test is FDA approved and can quantitate HIV-1 RNA over the  range of 40 - 1,000,000 copies/mL (1.60 log(10) -6.00 log(10) copies/mL). The reference range for this assay is Not Detected.    HIVCM : 08/13/2014 05:24 AM    ACD4 413 (L) 11/27/2016 02:53 PM    ACD4 277 (L) 07/18/2016 07:57 AM    ACD4 288 (L) 10/11/2015 02:51 PM    ACD4 <10 (L) 08/13/2014 05:24 AM         ADHERENCE      Number of tablets of HIV Medication(s) at home: 7 tablets of each    Did you miss any doses in the past 4 weeks? No missed doses reported.  Adherence counseling provided? Not needed     SIDE EFFECT MANAGEMENT    Are you tolerating your medication?:  Ryan Robinson reports tolerating the medication.  Side effect management discussed: None      Therapy is appropriate and should be continued.    The patient will receive an FSI print out for each medication shipped and additional FDA Medication Guides as required.  Patient education from [facility] or [name] may also be included in the shipment.    Evidence of clinical  benefit: See Epic note from 11/27/16      FINANCIAL/SHIPPING    Delivery Scheduled: Yes, Expected medication delivery date: 07/22/17   Additional medications refilled: No additional medications/refills needed at this time.    Ryan Robinson did not have any additional questions at this time.      Delivery address validated in FSI scheduling system: Yes, address listed above is correct.      We will follow up with patient monthly for standard refill processing and delivery.      Thank you,  Roderic Palau   [facility] Shared [facility] Specialty Pharmacist

## 2017-07-21 MED FILL — BUPROPION HCL XL/150MG/TB24: BUPROPION HCL XL/150MG/TB24 | 30 days supply | Qty: 90 | Fill #5

## 2017-07-21 MED FILL — TAMSULOSIN/0.4MG/CAP: TAMSULOSIN/0.4MG/CAP | 30 days supply | Qty: 30 | Fill #5

## 2017-07-21 MED FILL — GABAPENTIN/300MG/CAPS: GABAPENTIN/300MG/CAPS | 30 days supply | Qty: 360 | Fill #5

## 2017-07-21 MED FILL — HYDROCHLOROTHIAZIDE/25MG/TABS: HYDROCHLOROTHIAZIDE/25MG/TABS | 30 days supply | Qty: 30 | Fill #5

## 2017-07-21 MED FILL — DESCOVY/200MG/25MG/TABS: DESCOVY/200MG/25MG/TABS | 30 days supply | Qty: 30 | Fill #9

## 2017-07-21 MED FILL — TIVICAY/50MG/TAB: TIVICAY/50MG/TAB | 30 days supply | Qty: 30 | Fill #9

## 2017-07-23 NOTE — Unmapped (Signed)
Called pt as they have yet to start anything in regards to their HMAP    Left vm:   This is Insurance claims handler from [facility]. I am reaching out to you bc you have yet to start your medication assistance application. Your current coverage will end on March 31st. Please return my call so I can either mail or email you an application if you are unable to come into clinic.     Sherilynn Phen

## 2017-07-27 NOTE — Unmapped (Signed)
Attempted to reach patient this morning after seeing a missed call from her.   No answer, but left a confidential message for a return call.   Carley Hammed, RN

## 2017-07-27 NOTE — Unmapped (Signed)
REASON:  Ineffective Health Maintenance    ASSESSMENT: Received a call from patient.   She was at her PCP office and encouraged to call ID from that provider.  She sounded extremely upset as she has to go to court next week for not paying her rent and feels she will be evicted.  She has no idea where she will go or what she will do.  She states she has slipped up a few times but she is trying really hard to stay clean..States she knows she needs to be seen here in ID clinic.    ADHERENCE: unknown at this time;  Will need labs for confirmation    INTERVENTION:  Praised patient for calling.  Listened empathetically and advised she come in to see SW for help.  Although she has access to bus service, I did let her know about our transportation program.   I also mentioned to her the chance of meeting with Dr. Chandra Batch.    PLAN: Will continue to call patient on a weekly basis to see if she will come to clinic.    CONTACT DURATION: 15 minutes

## 2017-07-30 NOTE — Unmapped (Signed)
Duration of Intervention: 15 minutes    SW found the following resources for pt homeless crisis.     [facility]  -Patient can choose whether to go to women's shelter or men's shelter - this is up to her. If choosing men's shelter, pt has to wait until the end of the month when they have their meeting to discuss with staff. If choosing women's shelter, pt would not have to wait and would be able to go should beds be available. Pt should call Aundra Millet with her choice [phone] ext [phone]    [facility] [employer] [facility]  -Patient would have to say she is homeless [place]. Trans-women are allowed in womens shelter and pt would be allowed to bring Suboxone. However, pt cannot enroll in long term inpatient and stay on suboxone.   -Pt will have to show up at [address] in [place] at 3pm any day for a bed that night.       SW called pt and left voicemail to call back. Pt called back and left voicemail for this SW. SW called back and left another voicemail with Megan's number from [facility].     Bradly Bienenstock LCSWA, CHES

## 2017-08-02 NOTE — Unmapped (Signed)
Duration of Intervention: 15 minutes    REASON/TYPE OF CONTACT: SW returned voicemail from pt left on Saturday 07/31/17.     ASSESSMENT: In left voicemail on Saturday, pt stated preference for women's shelter at [facility] and didn't feel comfortable telling Megan at [facility] her choice as difficult relationship in past.     INTERVENTION:??Pt did not answer first call back this morning. SW left message for pt to call back in order for SW to determine proper placement in either drug tx or homeless shelter.      PLAN:?? SW will attempt to call pt again before end of day and every day this week if needed. Pt will call SW back to inform of preference between drug tx or shelter.     Bradly Bienenstock LCSWA, CHES

## 2017-08-02 NOTE — Unmapped (Signed)
Patient is considered out of care, with no office visit since 12/10/17. Bridge Counselor attempted to contact pt to schedule appointment for retention. No contact was made.  A discreet voice message was left asking for pt to return the writer's call.     Duration of Service: 3 minutes    Laretta Bolster  ID Clinic HIV Bridge Counselor  [phone]        ID Clinic Retention in Care ??? Bridge Counseling Care Plan    Goal:  Patient will retained in HIV care and treatment    Interventions may include:  ? Send patient an out of care notification via letter and/or MyChart  ? Placing phone calls to patient and/or listed contacts   ? Contacting pharmacies as needed for additional information  ? Researching other online resources as needed    If patient is reached, the bridge counselor attempt to:  ? Discover the reason for missed appointments, no shows, or lack of scheduled follow-up appointment such as transportation, finances, child care, or competing needs  ? Make appropriate referrals and link patient to clinic nurses, social workers, Artist or other support services in clinic to address concerns or barriers to care  ? Send In [employer] to social workers if case management is needed  ? Schedule a follow-up appointment at appropriate interval based on assessment  ? Monitor attendance at the appointment and provide reminder calls if needed  ? Continue follow-up as indicated    Expected Outcome:  Patient will be retained in HIV care with improved medication adherence     **If the patient is not able to be located or retained in HIV care a referral will be placed to the [place] HIV state bridge counselor for follow-up.

## 2017-08-02 NOTE — Unmapped (Signed)
Duration of Intervention: 5 minutes    Substance Abuse Education/Referral Care Plan    Care Plan was discussed and agreed upon with patient.     NEED: Patient is in need of education regarding substance use and abuse  The purpose of this care plan is to assist the patient in providing education around their persona substance use consumption and provide ongoing support/education if need arises.     SHORT TERM GOALS:  1. Identify whether the person is interested in reducing or eliminating their substance use and would be an appropriate candidate for ID Clinic SA program.  2. Make and document plan for if patient is interested in reducing or eliminating their substance use.  3. Educate patient of risk associated with continuing alcohol and/or illicit substance use.  4. Provide referrals   PERSON: Social Workers, Civil Service fast streamer, Nurse  INTERVENTION:   1. Patient and Social Worker will talk about substance use and determine patients of understanding of their personal substance use.  2. SW will provide psycho-education on negative consequences of substance use as well as the risk with continual and long-term use of alcohol and/or illicit substances.   3. Patient will provide insight into their personal substance use and identify if they view it as a concern or not  OUTCOME:   1. Patient will communicate with SW if they want to further explore their personal substance use with individual therapy or telephonic phone sessions, if applicable.   2. Patient will be provided with the appropriate referrals if needed.    Bradly Bienenstock LCSWA, CHES

## 2017-08-03 NOTE — Unmapped (Addendum)
Duration of Intervention: 15 minutes     Pt left voicemail for SW at 4am this day stating she would like to go to [facility] women's shelter and would like to go to [facility] ED beforehand to get clean and stable.     SW called pt back and left on voicemail for pt to go to [facility] in [place] to complete coordinated entry and then go to [facility] which is the womens shelter. Their contact info is below:     [facility]: [facility]  [phone]  [address], [place], [place]     [facility]   [phone]  [address] [place], [place]    Pt left voicemail on SW phone at 5pm stating she intends to come to ED in the next couple days but doesn't feel SI. Pt expressed disappointment that she cannot go right into [facility] women's shelter and does not know if a bed will be available for her.     Bradly Bienenstock LCSWA, CHES

## 2017-08-08 ENCOUNTER — Ambulatory Visit
Admit: 2017-08-08 | Discharge: 2017-08-17 | Disposition: A | Discharge status: Other Acute Inpatient Hospital | Payer: MEDICARE

## 2017-08-08 LAB — CBC W/ AUTO DIFF
BASOPHILS ABSOLUTE COUNT: 0.1 10*9/L (ref 0.0–0.1)
BASOPHILS RELATIVE PERCENT: 0.4 %
EOSINOPHILS ABSOLUTE COUNT: 0.1 10*9/L (ref 0.0–0.4)
EOSINOPHILS RELATIVE PERCENT: 1 %
HEMATOCRIT: 44.5 % (ref 41.0–53.0)
HEMOGLOBIN: 15 g/dL (ref 13.5–17.5)
LARGE UNSTAINED CELLS: 2 % (ref 0–4)
LYMPHOCYTES ABSOLUTE COUNT: 3.9 10*9/L (ref 1.5–5.0)
MEAN CORPUSCULAR HEMOGLOBIN CONC: 33.6 g/dL (ref 31.0–37.0)
MEAN CORPUSCULAR HEMOGLOBIN: 31.7 pg (ref 26.0–34.0)
MEAN CORPUSCULAR VOLUME: 94.3 fL (ref 80.0–100.0)
MONOCYTES RELATIVE PERCENT: 4.6 %
NEUTROPHILS ABSOLUTE COUNT: 9.4 10*9/L — ABNORMAL HIGH (ref 2.0–7.5)
NEUTROPHILS RELATIVE PERCENT: 65.4 %
PLATELET COUNT: 345 10*9/L (ref 150–440)
RED BLOOD CELL COUNT: 4.72 10*12/L (ref 4.50–5.90)
RED CELL DISTRIBUTION WIDTH: 13.1 % (ref 12.0–15.0)
WBC ADJUSTED: 14.3 10*9/L — ABNORMAL HIGH (ref 4.5–11.0)

## 2017-08-08 LAB — COMPREHENSIVE METABOLIC PANEL
ALBUMIN: 4.7 g/dL (ref 3.5–5.0)
ALKALINE PHOSPHATASE: 64 U/L (ref 38–126)
ALT (SGPT): 40 U/L (ref 19–72)
ANION GAP: 15 mmol/L (ref 9–15)
AST (SGOT): 38 U/L (ref 19–55)
BILIRUBIN TOTAL: 0.5 mg/dL (ref 0.0–1.2)
BLOOD UREA NITROGEN: 11 mg/dL (ref 7–21)
BUN / CREAT RATIO: 12
CALCIUM: 9.5 mg/dL (ref 8.5–10.2)
CHLORIDE: 103 mmol/L (ref 98–107)
CO2: 18 mmol/L — ABNORMAL LOW (ref 22.0–30.0)
CREATININE: 0.9 mg/dL (ref 0.70–1.30)
EGFR MDRD AF AMER: >=60 mL/min/{1.73_m2} (ref >=60)
EGFR MDRD NON AF AMER: >=60 mL/min/{1.73_m2} (ref >=60)
GLUCOSE RANDOM: 91 mg/dL (ref 65–179)
POTASSIUM: 4.3 mmol/L (ref 3.5–5.0)
PROTEIN TOTAL: 7.7 g/dL (ref 6.5–8.3)

## 2017-08-08 LAB — ALKALINE PHOSPHATASE: Alkaline phosphatase:CCnc:Pt:Ser/Plas:Qn:: 64

## 2017-08-08 LAB — DRUG SCREEN, URINE
BARBITURATE SCREEN URINE: <200
BENZODIAZEPINE SCREEN, URINE: <200
CANNABINOID SCREEN URINE: <20
OPIATE SCREEN URINE: <300

## 2017-08-08 LAB — NITRITE UA: Lab: NEGATIVE

## 2017-08-08 LAB — LIPASE: Triacylglycerol lipase:CCnc:Pt:Ser/Plas:Qn:: 67

## 2017-08-08 LAB — URINALYSIS WITH CULTURE REFLEX
BACTERIA: NONE SEEN /HPF
BILIRUBIN UA: NEGATIVE
GLUCOSE UA: NEGATIVE
HYALINE CASTS: 6 /LPF — ABNORMAL HIGH (ref 0–1)
KETONES UA: NEGATIVE
PH UA: 5 (ref 5.0–9.0)
PROTEIN UA: NEGATIVE
RBC UA: 10 /HPF — ABNORMAL HIGH (ref <=3)
SPECIFIC GRAVITY UA: 1.011 (ref 1.003–1.030)
UROBILINOGEN UA: 0.2
WBC UA: 45 /HPF — ABNORMAL HIGH (ref <=2)

## 2017-08-08 LAB — ETHANOL
ETHANOL: 34.1 mg/dL
Ethanol:MCnc:Pt:Ser/Plas:Qn:GC: 34.1

## 2017-08-08 LAB — SALICYLATE: Salicylates:MCnc:Pt:Ser/Plas:Qn:: <10

## 2017-08-08 LAB — BENZODIAZEPINE SCREEN, URINE: Lab: <200

## 2017-08-08 LAB — ACETAMINOPHEN LEVEL: Acetaminophen:MCnc:Pt:Ser/Plas:Qn:: <10

## 2017-08-08 LAB — RED CELL DISTRIBUTION WIDTH: Lab: 13.1

## 2017-08-08 NOTE — Unmapped (Signed)
Meal tray arrived and given to patient

## 2017-08-08 NOTE — Unmapped (Signed)
BIB [facility] due to active SI, requesting a psych evaluation. Pt denies HI. VSS.

## 2017-08-08 NOTE — Unmapped (Signed)
Patient rounds completed. The following patient needs were addressed:  Personal Belongings, Plan of Care, Call Bell in Reach and Bed Position Low .    Social work at the bedside

## 2017-08-08 NOTE — Unmapped (Signed)
Patient rounds completed. The following patient needs were addressed:  Patient rounds complete;Patient appears in NAD. Bed is secured, call bell available, belongings secured, q15 min checks maintained via CCTV and RN's assistance.

## 2017-08-08 NOTE — Unmapped (Signed)
Pt requested ginger ale, this Clinical research associate provided pt's request
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
## 2017-08-08 NOTE — Unmapped (Signed)
Patient rounds completed. Patient in bed, seated position at this time. Respirations even and unlabored. Mood is isolated, hopeless, helpless. Continues to complain of nausea but refuses any offered interventions from staff. No acute distress noted or reported. No signs or symptoms of pain noted or reported. Safety bed in low, locked position per unit protocol. Bedside table and call bell accessible. Patient remains in secured area on unit, dressed in gown and scrubs per unit protocol. Non-skid socks and patient ID band present on patient.  Remains on continuous CCTV observation and 15 minute safety checks. Will continue to monitor.

## 2017-08-08 NOTE — Unmapped (Signed)
Pt is resting in bed watching television, NAD noted at this time. Pt voices no concerns or needs at this time. Will continue to monitor pt for safety.

## 2017-08-08 NOTE — Unmapped (Signed)
Pharmacy at the bedside

## 2017-08-08 NOTE — Unmapped (Signed)
Patient arrived on unit escorted by staff with patient belongings in white bag, placed in secured locker. Patient oriented to unit, nods understanding but currently declining to participate in conversation verbally. New patient admission process initiated. Patient in bed, eyes closed during initial conversation, repeatedly stating I'm exhausted. I can't do this right now. Mood is slightly irritable, tearful at times, reports feeling hopeless. Affect is sad, slightly irritable. Respirations deep, even, unlabored. No acute distress noted or reported. No signs or symptoms of pain noted or reported. Safety bed in low, locked position per unit protocol. Patient dressed in gown and scrubs per unit protocol. Non-skid socks and patient ID band present on patient.  Remains on continuous CCTV observation and 15 minute safety checks. Will continue to monitor closely.

## 2017-08-08 NOTE — Unmapped (Signed)
Patient rounds completed. The following patient needs were addressed:  Personal Belongings, Plan of Care, Call Bell in Reach and Bed Position Low .

## 2017-08-08 NOTE — Unmapped (Signed)
Bed: 25-B  Expected date:   Expected time:   Means of arrival:   Comments:  EMS

## 2017-08-08 NOTE — Unmapped (Signed)
Patient ambulatory to the unit at this time. Patient has 3 bags locked in locker #43. Patient provided with ginger ale per requested. Patient stated she received a meal tray but it was cold and she felt nauseous at the time. Patient meal tray again ordered at this time. Patient denies any further needs at this time. Watching tv in treatment room, RN in to assess.

## 2017-08-08 NOTE — Unmapped (Signed)
Pt has TWO white belonging bags and one gray personal bag; all labeled and sealed by this Clinical research associate.

## 2017-08-08 NOTE — Unmapped (Signed)
Care Management  Initial Transition Planning Assessment     CM was able to meet with the patient in the ED who agreed to complete the Initial Transition CM assessment.   The patient???s stated compliant in the ED was Suicidal.  The patient presented to the ED with a chief compliant of SI.  The patient denies the use of DME to complications with ADL/IADLS.     General  Care Manager assessed the patient by : In person interview with patient, Medical record review, Discussion with Clinical Care team  Orientation Level: Oriented X4  Who provides care at home?: N/A    Contact/Decision Maker:    Contact Details  Contact Details: Primary Contact  Primary Contact Name: Singleton,Ronald   Primary Contact Relationship: Other (comment) (Friend)  Phone #1: [phone]    Advance Directive (Medical Treatment)  Does patient have an advance directive covering medical treatment?: Patient does not have advance directive covering medical treatment., Patient would not like information.  Reason patient does not have an advance directive covering medical treatment:: Patient does not wish to complete one at this time  Surrogate decision maker appointed:: No  Reason there is not a surrogate decision maker appointed:: Patient does not wish to appoint a surrogate decision maker at this time  Information provided on advance directive:: No  Patient requests assistance:: No    Advance Directive (Mental Health Treatment)  Does patient have an advance directive covering mental health treatment?: Patient does not have advance directive covering mental health treatment., Patient would not like information.    Patient Information:    Lives with: Other (Comment) (Patient resides in a Board and Care home where he rents a room)      Type of Residence: Mailing Address:  [address] [place] [address]  Patient reports that the home is 1 level  and a few steps to entrance.   Contacts: Contact Details: Primary Contact  Primary Contact Name: Singleton,Ronald   Primary Contact Relationship: Other (comment) (Friend)  Phone #1: [phone]  Patient Phone Number: [phone] (home)           Medical Provider(s): Ross Ludwig, ANP  Reason for Admission:   Admitting Diagnosis:  No admission diagnoses are documented for this encounter.  Past Medical History:   has a past medical history of ADHD (attention deficit hyperactivity disorder); Alcoholism (CMS-HCC); Anorexia nervosa; Anxiety; Bipolar disorder (CMS-HCC); Depression; Depressive disorder; HIV (human immunodeficiency virus infection) (CMS-HCC); Migraines; Panic disorder; R/O Borderline personality disorder (12/02/2015); Seizures (CMS-HCC); Substance abuse (CMS-HCC); and Substance Use Treatment History.  Past Surgical History:   has a past surgical history that includes Hip Arthroplasty and Joint replacement (2011).   Previous admit date: 07/17/2016    Primary Insurance- Payor: MEDICAID [employer] / Plan: MEDICAID [employer] / Product Type: *No Product type* /   Secondary Insurance ??? Secondary Insurance  MEDICAID [place]  Prescription Coverage ??? Payor: MEDICAID [employer] / Plan: MEDICAID [employer] / Product Type: *No Product type* /   Preferred Pharmacy - [facility] - [place], [place] - [address]  [facility] - [place], [place] - [address]  [facility] - [place], [place] - [address]    Transportation home: Private vehicle  Level of function prior to admission: Independent    Type of Residence: Shelter        Location/Detail: [address] [place], [place] [address]    Support Systems: Friends/Neighbors    Responsibilities/Dependents at home?: No    Home Care services in place prior to admission?: No          Outpatient/Community Resources in place prior to admission: Outpatient Psychiatric Care, Outpatient Substance Abuse, Clinic  Agency detail (Name/Phone #): Marcelene Butte Elida, ANP Phone: [phone]    Equipment Currently Used at Home: none  Current HME Agency (Name/Phone #): None.     Currently receiving outpatient dialysis?: No       Legal NOK/Guardian/POA/AD-   Surrogate Decision Makers: At this time the patient is able to make  her own decisions.      Financial Information:     Patient source of income: Disabled. SSDI.    Need for financial assistance?: No       Discharge Needs Assessment:    Concerns to be Addressed: substance/tobacco abuse/use, grief and loss, care coordination/care conferences, discharge planning, adjustment to diagnosis/illness    Clinical Risk Factors: Other (Comment) (Mental Health Evaluation: Pending Evaluation from  PES> )    Barriers to taking medications: No    Prior overnight hospital stay or ED visit in last 90 days: Yes (04/30/17 ED, 04/29/2017 ED and 01/02/2017 ED visit. )    Readmission Within the Last 30 Days: no previous admission in last 30 days    Patient's Choice of Community Agency(s): None.     Anticipated Changes Related to Illness: other (see comments) ([facility] Evaluation. )    Equipment Needed After Discharge: none    Discharge Facility/Level of Care Needs: other (see comments) (Pending PES evaluation. )    Patient at risk for readmission?: No    Discharge Plan:    Screen findings are: Discharge planning needs identified or anticipated (Comment).    I have discussed the discharge plan with the patient/family, who voiced understanding.  Patient/family are in agreement with the discharge plan.    Discussed plan of care with attending RN and ed prescriber who are in agreement with the disharge plan.  Patient could be discharged from the emergency department if cleared by [facility]> .     D/C PLAN:    Screen findings are: Discharge planning needs identified or anticipated (Comment).      CM DC Plan:  Update: CAT completed in ED  [ ]  Pending [facility] Evaluaton  [ ]  CM/SW will continue to follow for avoidable delays and opportunities for progression of care.    Expected Discharge Date:  (TBD)    Expected Transfer from Critical Care:  (N/A)    Patient and/or family were provided with choice of facilities / services that are available and appropriate to meet post hospital care needs?: Yes   List choices in order highest to lowest preferred, if applicable. : Pending PES Evaluation    Initial Assessment complete?: Yes      No future appointments.      Rosaria Ferries, MSW, [place], CCM  Pager [phone]  August 08, 2017 4:51 PM

## 2017-08-08 NOTE — Unmapped (Signed)
Pt was escorted to the bathroom by this writer, NAD noted. Pt back in room and calm at this time. No concerns or needs voiced, will continue to monitor for safety.
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
## 2017-08-09 NOTE — Unmapped (Signed)
Quiet in bed. Refusing Melatonin for the night. Reports pain still present. Refusing any further intervention.

## 2017-08-09 NOTE — Unmapped (Signed)
Quiet in bed with eyes closed. Respirations even and unlabored. q15 minute observation.

## 2017-08-09 NOTE — Unmapped (Signed)
B side resident consulted regarding unresolved right flank pain and admission U/A results. Plan to start po antibiotics. Order obtained.

## 2017-08-09 NOTE — Unmapped (Signed)
Patient rounds completed. Patient currently in bed with head of bed at 45 degrees and watching televison. Asked patient how she was feeling and she stated, I'm alright I guess, I'm just bored. No complaints of pain. No signs of distress. Bed in lowest position. Non skid socks on patient.Patient currently has curtain closed to personal area. Will continue to monitor patient.

## 2017-08-09 NOTE — Unmapped (Addendum)
Pt sitting in bed watching television quietly

## 2017-08-09 NOTE — Unmapped (Signed)
Patient rounds completed. Pt is in the bed laying down watching TV. Bed is locked and in the lowest position. Q15 min checks are being done for safety.

## 2017-08-09 NOTE — Unmapped (Signed)
Report received from [place], [place].  Patient rounds completed.  Patient resting quietly in bed and appears to be sleeping.  Safety precautions including q15 minute checks and CCTV observation continued.
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
## 2017-08-09 NOTE — Unmapped (Signed)
Patient rounds completed. Pt is in the bed appears to be sleeping. Bed is locked and in the lowest position. Q15 min checks are being done for safety.

## 2017-08-09 NOTE — Unmapped (Signed)
Sleeping at present. Atarax effective.

## 2017-08-09 NOTE — Unmapped (Signed)
Patient rounds completed. Patient currently asleep in bed after having eaten 75% of lunch tray.  No signs of distress. Patient's head of bed at 45 degrees. Bed in lowest position. Non skid socks on patient. Patient wearing yellow falls risk band. Patient curtain currently closed. Will continue to monitor patient. CIWA and pain assessment complete.

## 2017-08-09 NOTE — Unmapped (Signed)
Report given to Lovelady, Charity fundraiser. Patient care transferred at this time.

## 2017-08-09 NOTE — Unmapped (Signed)
Patient rounds completed.Pt is laying the in the bed. Bed is locked and in the lowest position. Q15 min checks are being done for safety.

## 2017-08-09 NOTE — Unmapped (Signed)
Report received from [facility]. Patient care transferred at this time.

## 2017-08-09 NOTE — Unmapped (Signed)
Quiet in bed resting. In no acute distress.

## 2017-08-09 NOTE — Unmapped (Signed)
Meal tray giving to the patient

## 2017-08-09 NOTE — Unmapped (Addendum)
Holding head in bed. Given atarax without complication. Encouraged to rest.

## 2017-08-09 NOTE — Unmapped (Signed)
Patient rounds completed. The following patient needs were addressed:  Patient rounds complete;Patient appears in NAD. Bed is secured, call bell available, belongings secured, q15 min checks maintained via CCTV and RN's assistance. Patient watching tv in room at this time.

## 2017-08-09 NOTE — Unmapped (Signed)
Awake asking for crackers. Crackers given. Anxious rates 10 out of 10 states I can hardly stand it. Negative for other symptoms on CIWA scale. Consulted with B side resident whether to treat from CIWA scale in the absence of other withdrawal symptoms or treat for targeted persistent symptom of anxiety. Plan to treat for targeted symptom. Order obtained. Psych team made aware.

## 2017-08-09 NOTE — Unmapped (Signed)
error 

## 2017-08-09 NOTE — Unmapped (Signed)
Questioning dose prior to taking am dose. Placed under tongue as instructed.
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
## 2017-08-09 NOTE — Unmapped (Signed)
Zofran given for C/O persistent nausea without vomiting. Motrin given for right flank pain. Medication taken without complication.

## 2017-08-09 NOTE — Unmapped (Signed)
Patient rounds completed. Patient currently asleep in bed. No signs of distress noted. Patient's head of bed at 45 degrees. Bed in lowest position. Non skid socks on patient. Currently awaiting for patient to provide urine sample. Empty urine cup for sample on bedside table. Will continue to monitor patient.

## 2017-08-09 NOTE — Unmapped (Addendum)
Received report from [employer]. Patient rounds completed. Patient laying supine, resting quietly in bed and appears to be sleeping. Eyes closed. Safety precautions including q15 minute checks.
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
## 2017-08-09 NOTE — Unmapped (Signed)
Patient was in her room crying quietly when approached. Patient had just been seen by social work.

## 2017-08-09 NOTE — Unmapped (Signed)
CIWA 9 positive for nausea and anxiety. Librium 25mg  given. Per shift report refused evening dose of suboxone reporting she didn't need it. Request to hold Melatonin HS dose. Requesting to take later if needed only. Took the remainder HS medication without complication.

## 2017-08-10 LAB — LYMPH MARKER LIMITED,FLOW
ABSOLUTE CD3 CNT: 2972 {cells}/uL (ref 915–3400)
ABSOLUTE CD4 CNT: 544 {cells}/uL (ref 510–2320)
CD4:CD8 RATIO: 0.2 — ABNORMAL LOW (ref 0.9–4.8)
CD8% T SUPPRESR": 57 % — ABNORMAL HIGH (ref 12–38)

## 2017-08-10 LAB — ABSOLUTE CD8 CNT: Lab: 2386 — ABNORMAL HIGH

## 2017-08-10 NOTE — Unmapped (Signed)
Pt rounds complete. All pt's needs were addressed at this time. Pts bed lowest postion, locked. No call bell due to rules. Pt is NAD and I will continue to monitor.

## 2017-08-10 NOTE — Unmapped (Signed)
Patient rounds completed. Pt awake and lying in bed watching TV. Pt pleasant and calm.

## 2017-08-10 NOTE — Unmapped (Signed)
Patient rounds completed. Pt awake after vitals taken. Pt sitting quietly in chair out in hallway reading magazines.

## 2017-08-10 NOTE — Unmapped (Signed)
Pt resting quietly with eyes closed at this time. NAD noted at this time. Patient rounds completed. The following patient needs were addressed:  Call Bell in Reach, Bed Position Low and pt in bed with eyes closed . q15 minute checks maintained

## 2017-08-10 NOTE — Unmapped (Signed)
Patient rounds completed. Pt is laying the bed but not asleep just resting. Bed is locked and in the lowest position. Q15 min checks are being done for safety.
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
## 2017-08-10 NOTE — Unmapped (Signed)
Pt a/ox4. Pt has been calm and cooperative this evening. Pt resting in bed and up ad lib to bathroom. Pt denied SI/HI and AVH this evening. Pt was tearful during assessment but refused to discuss reason for crying. Pt was irritable when having to take evening medication. Pt report his suboxone medication times is not how it is taken. Pt agree to take this evening medications but refused to take complete dose of suboxone. Pt refused to take second suboxone SL film 2mg  due to c/o n/v. Pt did not have vistors/calls this evening. Continue pt care.

## 2017-08-10 NOTE — Unmapped (Signed)
Report given to Carley Hammed, RN. Patient care transferred at this time.

## 2017-08-10 NOTE — Unmapped (Signed)
Patient rounds completed. Pt in bed with eyes closed. No distress noted.

## 2017-08-10 NOTE — Unmapped (Signed)
Social worker returned phone call in regards to patient's request for gabapentin. SW indicated that a pharmacy consult would need to be done to determine if gabapentin will be put on [facility] for patient. Informed patient of update from social work and informed her that most likely there would be more information tomorrow in regards to the gabapentin. Patient was pleased to have the information and stated, that's fine, I can wait.

## 2017-08-10 NOTE — Unmapped (Signed)
Patient rounds completed. Pt is in the bed watching TV. Bed is locked and in the lowest position. Q15 min checks are being done for safety.

## 2017-08-10 NOTE — Unmapped (Signed)
Patient rounds completed. Pt is in the bed appears to be sleeping. Bed is locked and in the lowest position. Q15 min checks are being done for safety.

## 2017-08-10 NOTE — Unmapped (Signed)
Patient rounds completed. Pt lying in bed with eyes closed. No distress noted.

## 2017-08-10 NOTE — Unmapped (Signed)
Pt resting quietly in bed with no complaints at this time. Suicide precautions in place. No call bell due to admission diagnosis/unit policy, however frequent monitoring, q72min safety sheet, and safety precautions maintained.

## 2017-08-10 NOTE — Unmapped (Addendum)
Patient approached nurse and asked, Why am I not getting my gabapentin? Told patient that after an investigation of her chart, would give her an informed response.  After a review of her chart, there was no indication as to why gabapentin was discontinued. Paged PES: Sharmon Leyden is requesting why she has not been getting her gabapentin. Review of her patient chart does not indicate her current history/need of gabapentin. Please consult. Thank you. Amil Amen RN Team C

## 2017-08-10 NOTE — Unmapped (Signed)
Patient rounds completed. Pt resting in bed with eyes open.

## 2017-08-10 NOTE — Unmapped (Signed)
Pt reports feeling shaky, nauseous, and anxious. Librium given for withdrawal symptoms. Suicide precautions in place. No call bell due to admission diagnosis/unit policy, however frequent monitoring, q3min safety sheet, and safety precautions maintained.

## 2017-08-10 NOTE — Unmapped (Signed)
Patient rounds completed. Patient currently in bed with head of bed at 45 degrees, watching television, and reviewing menu. Patient seemed more hopeful than previous encounters when she talked about potentially going to [facility] for further treatment then perhaps going to [facility]. No complaints of pain. No signs of distress. Bed in lowest position. Non skid socks on patient.Patient currently has curtain closed to personal area. Will continue to monitor patient. Patient on 15 minute checks per unit protocol and monitored by CCTV for safety.

## 2017-08-11 NOTE — Unmapped (Signed)
Patient rounds completed. The following patient needs were addressed: Pt.observed resting quietly in bed. Respirations even and unlabored.Monitored on CC TV. Appears to be in no acute distress. Safety/Suicide precautions in place. Bed locked and in the lowest position. Bedside table within reach. Non-skid socks and ID-bad on pt. Continues on Q 15 minute checks.  No needs at this time. Will continue to monitor pt.

## 2017-08-11 NOTE — Unmapped (Signed)
Patient rounds completed.  Pt resting in room with no signs or symptoms of distress noted.  Respirations even and unlabored.  Safety precautions and observation continued Q 15 minutes.  Will continue to monitor and reassess patient needs. Bed secured to floor. Call bell on wall in room.  Permissible patient items within reach.

## 2017-08-11 NOTE — Unmapped (Signed)
Patient rounds completed. The following patient needs were addressed: Pt.observed resting quietly in bed. Respirations even and unlabored. Appears to be in no acute distress. Safety/Suicide precautions in place. Bed locked and in the lowest position. Bedside table within reach. Non-skid socks and ID-bad on pt. Continues on Q 15 minute checks.  No needs at this time. Will continue to monitor pt.

## 2017-08-11 NOTE — Unmapped (Signed)
Patient out of bed to use the restroom.

## 2017-08-11 NOTE — Unmapped (Signed)
Patient observed in room sitting in a recliner watching TV. Will continue to monitor.

## 2017-08-11 NOTE — Unmapped (Signed)
Pt rounds complete. All pt's needs were addressed at this time. Pts bed lowest postion, locked. No call bell due to rules. Pt is NAD and I will continue to monitor.

## 2017-08-11 NOTE — Unmapped (Signed)
Patient rounds completed. The following patient needs were addressed,pt receive pt  breakfast ,pt is sitting up eating at time  ,pt will be monitor with Q 15 to keep pt safe

## 2017-08-11 NOTE — Unmapped (Signed)
Pt taken to bhed to take a shower at this time

## 2017-08-11 NOTE — Unmapped (Signed)
Pt out  The room  Standing by the  Nurses station  Locking  To a magazine, pt does not  Expresses  Any  Concerns at this time,

## 2017-08-11 NOTE — Unmapped (Signed)
Psych  Social worker  And NP [name]  At bedside

## 2017-08-11 NOTE — Unmapped (Signed)
Patient rounds complete. Patient is resting quietly on their bed. Respirations even and unlabored. Patient expressed no needs at this time. Patient remains on continuous CCTV observation.

## 2017-08-11 NOTE — Unmapped (Signed)
Report given to [facility]. Patient care transferred at this time.
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
## 2017-08-11 NOTE — Unmapped (Signed)
Patient rounds completed. The following patient needs were addressed: Pt.observed resting quietly in his bed. Appears to be in no acute distress. Safety/Suicide precautions in place. Q 15 minute checks in place. Bedside table within reach. Purple gown,non-skid socks and id bracelet  On. No needs at this time. Will continue to monitor pt.

## 2017-08-11 NOTE — Unmapped (Signed)
Patient rounds completed. The following patient needs were addressed:Pt.observed resting quietly in bed. Respirations even and unlabored. Appears to be in no acute distress. Safety/Suicide precautions in place. Bed locked and in the lowest position. Bedside table within reach.  Non-skid socks,purple gown  and ID-bad on pt. Continues on Q 15 minute checks.  No needs at this time. Will continue to monitor pt.
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
## 2017-08-11 NOTE — Unmapped (Signed)
Report received from [facility].  Pt.observed resting quietly in bed. No acute distress. All needs addressed at this time. Denies any c/o pain or discomfort. Bedside table within reach. Q15 minute checks maintained. Will continue to monitor pt.

## 2017-08-11 NOTE — Unmapped (Signed)
Pt reports anxiety, but does not score on CIWA to receive Librium. Pt reports feeling anxious, but is relaxed in her room watching tv. Pt informed that she does not have any PRNS for anxiety at this time and indicates understanding

## 2017-08-11 NOTE — Unmapped (Signed)
ED Progress Note    S  Ryan Robinson is a 42 y.o. male transitioning to male with a history of HIV (on ART), seizures, substance abuse, anorexia nervosa, panic disorder, bipolar disorder, borderline personality disorder, anxiety, and depression presented to the ED via [facility] for SI. She has been medically clear and evaluated by PES with plan for bed placement.    O  Constitutional: Alert and oriented, very anxious appearing  Eyes: Conjunctivae are normal. Pupils equal and reactive.   ENT       Head: Normocephalic and atraumatic.       Nose: No epistaxis.       Mouth/Throat: Mucous membranes are moist.       Neck: No stridor. No nuchal rigidity.  Hematological/Lymphatic/Immunilogical: No cervical lymphadenopathy.  Cardiovascular: Regular rate, regular rhythm. Normal and symmetric distal pulses are present in bilateral upper extremities.  Respiratory: Normal respiratory effort. Breath sounds are clear to auscultation and equal bilaterally.  Gastrointestinal: Soft and nontender. There is no rebound tenderness, guarding or rigidity.  Musculoskeletal: Nontender with normal range of motion in all extremities.   Right lower leg: No tenderness or edema.   Left lower leg: No tenderness or edema.  Neurologic:  No gross focal neurologic deficits are appreciated.  Skin: Skin is warm, dry and intact. No rash noted.  Psychiatric:  Tearful, anxious, splitting    Vitals:    08/11/17 0634   BP: 108/64   Pulse: 67   Resp: 16   Temp: 36.4 ??C (97.5 ??F)   SpO2: 96%     A/P  -Gabapentin dosing- Pt states taking 1200mg  TID at home but unable to verbalize why. I have concern for such high dosing. Will keep at 900mg  TID while inpatient.    -Alcohol withdrawal- CIWA 0-2 for last 12 hours, 72 hours into sobriety. Will d/c CIWA protocol at this time.

## 2017-08-11 NOTE — Unmapped (Signed)
Patient rounds completed. The following patient needs were addressed: Pt.observed sitting in a recliner watching TV. Appears to be in no acute distress. Safety/Suicide precautions in place. Q 15 minute checks in place. Compliant with meds.  No needs at this time. Will continue to monitor pt.

## 2017-08-11 NOTE — Unmapped (Signed)
Pt provided  To meal  Tray.

## 2017-08-11 NOTE — Unmapped (Signed)
Patient rounds completed: All of patient needs have been met at this time.Will continue to monitor and do Q15 min check sheets thru out the day.

## 2017-08-11 NOTE — Unmapped (Signed)
Pt reports improving pain and has no other requests at this time. Side rails up x2 with bed locked and in lowest position, q15 minute checks maintained. Will continue to monitor

## 2017-08-11 NOTE — Unmapped (Signed)
Patient Rounds completed. Patient breakfast tray provided. Bed low and locked, call bell available.

## 2017-08-11 NOTE — Unmapped (Signed)
Patient rounds completed. Patient is resting on their bed. Patient expresses no needs at this time. Bed is in lowest position and wheels are locked. Patient curtain is open to follow safety precautions. Will continue to monitor.

## 2017-08-11 NOTE — Unmapped (Signed)
Patient rounds completed. The following patient needs were addressed ,pt receive lunch,pt is comfortable , will be monitor with Q 15 to keep pt safe
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
## 2017-08-11 NOTE — Unmapped (Signed)
Pt's friend at bedside  visiting

## 2017-08-11 NOTE — Unmapped (Signed)
Patient refused vitals ask why we need to keep taking his vitals before NA could answer he said no again

## 2017-08-12 LAB — HIV RNA LOG(10): Lab: 0

## 2017-08-12 LAB — HIV RNA, QUANTITATIVE, PCR

## 2017-08-12 MED ORDER — GABAPENTIN 300 MG CAPSULE
ORAL_CAPSULE | Freq: Three times a day (TID) | ORAL | 0 refills | 0 days
Start: 2017-08-12 — End: 2017-08-12

## 2017-08-12 MED ORDER — DOLUTEGRAVIR 50 MG TABLET
Freq: Every day | ORAL | 0 refills | 0.00000 days | Status: CP
Start: 2017-08-12 — End: 2017-08-12

## 2017-08-12 MED ORDER — EMTRICITABINE 200 MG-TENOFOVIR ALAFENAMIDE FUMARATE 25 MG TABLET
ORAL_TABLET | ORAL | 0 refills | 0 days | Status: SS
Start: 2017-08-12 — End: 2018-02-23

## 2017-08-12 MED ORDER — DOLUTEGRAVIR 50 MG TABLET: 50 mg | each | 0 refills | 0 days

## 2017-08-12 NOTE — Unmapped (Signed)
Patient is currently using the phone at the nurses station.

## 2017-08-12 NOTE — Unmapped (Signed)
Patient rounds completed. Patient is awake, talking with nurses at nurses station. Calm and cooperative at this time. Q15 minute checks are being maintained.

## 2017-08-12 NOTE — Unmapped (Signed)
Patient rounds completed.  Pt resting with no signs or symptoms of distress noted.  Respirations even and unlabored.  Safety precautions and observation continued Q 15 minutes.  Will continue to monitor and reassess patient needs. Bed secured to floor. Call bell on wall in room.  Permissible patient items within reach.
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
## 2017-08-12 NOTE — Unmapped (Signed)
Report received from [facility].  Writer introduced self to patient.  Patient ID checked x2.  Patient denies questions and concerns at this time.  Appears sad and withdrawn, resting in a recliner.  TV turned on in the room and lights are dimmed.  Safety precautions including q15 minute checks and CCTV observation continued.

## 2017-08-12 NOTE — Unmapped (Signed)
Patient came out to the hallway and started yelling helllllooooooo anyone here?! NA asked patient what she needed and she began to unload a list of reasons why she was upset, needing to shave immediately, wanting meds, etc. Pt was asked to lower her voice and that her concerns has been discussed already. Pt refused to listen to this NA and became agitated with nurse.

## 2017-08-12 NOTE — Unmapped (Signed)
Pt denies any needs at this time. NAD noted.

## 2017-08-12 NOTE — Unmapped (Signed)
Sarah with PES is currently at bedside.  As per Maralyn Sago patient has been accepted to [facility] for [facility].

## 2017-08-12 NOTE — Unmapped (Signed)
Patient is currently in the restroom shaving.  NA present.

## 2017-08-12 NOTE — Unmapped (Signed)
Patient rounds completed.  Pt ambulating around unit with no signs or symptoms of distress noted.  Respirations even and unlabored.  Safety precautions and observation continued Q 15 minutes.  Will continue to monitor and reassess patient needs. Bed secured to floor. Call bell on wall in room.  Permissible patient items within reach.

## 2017-08-12 NOTE — Unmapped (Signed)
Patient continues to watch TV in her assigned room.  She continues to appear withdrawn and is difficult to engage with.  Nicotine patch applied.  Patient asked about the dosage change and writer explained as per NP 14mg  was ordered due to the additional order of nicotine gum.

## 2017-08-12 NOTE — Unmapped (Signed)
Pt at nurse's station using phone

## 2017-08-12 NOTE — Unmapped (Signed)
Patient rounding completed. Bed low and locked, Q15 min checks continue per protocol.

## 2017-08-12 NOTE — Unmapped (Signed)
Patient finished a phone call and requested medication for anxiety.  Discussed orders and then stated, that is like benadryl.  It doesn't do anything for your nerves it just puts you to sleep.  She returned to her room.  Will continue to monitor.

## 2017-08-12 NOTE — Unmapped (Signed)
Received patient from Achille Rich RN, patient resting quietly at this time with NAD noted, not woken at this time to promote therapeutic environment.

## 2017-08-12 NOTE — Unmapped (Signed)
Pt sitting on side of bed calm and cooperative at this time. When asked of SI pt states yeah, I feel like killing myself, I got to get out of here. When asked of suicidal plan pt states  I don't want to talk about that right now denies HI, AVH. Denies  Pain. nad noted. Will continue to monitor.

## 2017-08-12 NOTE — Unmapped (Signed)
Pt at the nurses station using the unit phone, NAD noted

## 2017-08-12 NOTE — Unmapped (Signed)
Patient rounds completed. Patient is resting in bed at this time. Bed is in the lowest, locked position. Q15 safety checks are being maintained.

## 2017-08-12 NOTE — Unmapped (Signed)
Report given to [facility]. Patient care transferred at this time.

## 2017-08-12 NOTE — Unmapped (Signed)
Patient rounds completed. Patient is resting in bed at this time. Bed is in the lowest, locked position. Q15 minute safety checks are being maintained.

## 2017-08-12 NOTE — Unmapped (Signed)
Patient Relations representative is currently at bedside.

## 2017-08-12 NOTE — Unmapped (Signed)
IVC paperwork received from hospital police and placed in patients chart/purple folder.

## 2017-08-12 NOTE — Unmapped (Signed)
Patient rounds completed.  Pt resting in room with no signs or symptoms of distress noted.  Respirations even and unlabored.  Safety precautions and observation continued Q 15 minutes.  Will continue to monitor and reassess patient needs. Bed secured to floor. Call bell on wall in room.  Permissible patient items within reach.

## 2017-08-12 NOTE — Unmapped (Signed)
Patient rounds completed.  Patient is currently sitting in a recliner with eyes closed.  Appears to be sleeping at this time.  No acute distress noted.  Safety precautions including q15 minute checks and CCTV observation continued.

## 2017-08-12 NOTE — Unmapped (Signed)
Patient rounds completed. Patient is standing in room watching TV and is calm and cooperative at this time. Q15 minute safety checks are being maintained.

## 2017-08-12 NOTE — Unmapped (Signed)
This RN entered pt's room to complete assessment and speak with pt. Pt states, Ryan Robinson been asking for gum for 25 minutes and this is how I get treated.  This Clinical research associate informed pt that the gum could be pulled from the pyxis and the assessment continued after. Pt states,  I don't want to talk, I don't want any gum, just leave me alone.  Pt informed that she can have the gum and doesn't have to talk or answer any questions, pt continues to refuse gum.  Pt informed that if she changes her mind or just wants to talk then this writer is available and willing to listen.

## 2017-08-12 NOTE — Unmapped (Signed)
Pt denied needs, refused to make eyecontact. Sitting in recliner chair in room watching tv, NAD noted.

## 2017-08-12 NOTE — Unmapped (Signed)
NA introduced self to patient. Given ginger ale per request. Pt requested to shave and was informed NA will supervise this activity soon. Pt currently standing in room, NAD noted. Q15 minute check sheet and CCTV monitoring continued.

## 2017-08-12 NOTE — Unmapped (Signed)
Patient rounds completed. Patient is resting in bed with eyes closed at this time. Bed is in the lowest, locked position. Q15 minute checks are being maintained.

## 2017-08-12 NOTE — Unmapped (Signed)
Patient rounds completed. Patient is currently resting in bed. Bed is in the lowest, locked position. Q15 minute safety checks are being maintained.

## 2017-08-12 NOTE — Unmapped (Signed)
Patient received lunch tray, NAD noted.

## 2017-08-12 NOTE — Unmapped (Signed)
Report given to Ashok Norris RN. Patient care transferred at this time.

## 2017-08-13 MED ORDER — EMTRICITABINE 200 MG-TENOFOVIR ALAFENAMIDE FUMARATE 25 MG TABLET
ORAL_TABLET | Freq: Every day | ORAL | 0 refills | 0.00000 days | Status: CP
Start: 2017-08-13 — End: 2017-08-13

## 2017-08-13 NOTE — Unmapped (Signed)
Report given to [place], Charity fundraiser. Patient care transferred at this time.

## 2017-08-13 NOTE — Unmapped (Signed)
Patient rounds completed: All of patients needs have been met at this time .Will continue to monitor and do Q15 min check sheets thru out the night.

## 2017-08-13 NOTE — Unmapped (Signed)
Report given to Shelby, RN
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
## 2017-08-13 NOTE — Unmapped (Signed)
Pt up and walking to bathroom.

## 2017-08-13 NOTE — Unmapped (Signed)
Patient rounds completed. The following patient needs were addressed:  Pain, Toileting, Positioning;   , Plan of Care and Bed Position Low . Pt currently resting  At bedside watching tv. Pt respirations even and unlabored. No acute distress noted. PT q15 minute checks maintained by NA via visualization and cctv camera. Will continue to monitor

## 2017-08-13 NOTE — Unmapped (Signed)
Patient Rounding Complete, Pt is currently laying in bed watching Tv with no labored breathing. Will continue to monitor Pt and do 15 min checks. Pts belonging are locked in the locker that is coordinated with their room number. Pts bed is locked and in lowest position and a cordless call bell is in reach.

## 2017-08-13 NOTE — Unmapped (Signed)
Recreational Therapy Treatment  08/13/2017     Patient Name:  Ryan Robinson       Medical Record Number: [id number]   Date of Birth: Jul 25, 1975  Sex: Male          Room/Bed:  46-C/46-C    Tx Duration: 5 Min.        Assessment  Note Type: Treatment Note  Contact Note: LRT met with pt to complete Recreational Therapy evaluation, however pt declined Recreational Therapy services at this time. LRT will continue to monitor and should pt request Rec Therapy services, LRT will re-evaluate.        Plan of Care  D/C Services for: D/C Services     I attest that I have reviewed the above information.  Signed by Lasandra Beech 08/13/2017

## 2017-08-13 NOTE — Unmapped (Signed)
Pt at nurses station phone. Pt is calm and interactive with staff.

## 2017-08-13 NOTE — Unmapped (Signed)
Patient rounds completed. The following patient needs were addressed:  Pain, Toileting, Positioning;   LEFT side, Plan of Care and Bed Position Low . Pt currently resting in bed. Pt respirations even and unlabored. No acute distress noted. PT q15 minute checks maintained by NA via visualization and cctv camera. Will continue to monitor

## 2017-08-13 NOTE — Unmapped (Signed)
Patient rounds completed. The following patient needs were addressed:  Pain, Toileting, Positioning;   LEFT side, Plan of Care and Bed Position Low . Pt currently resting in bed asleep. Pt respirations even and unlabored. No acute distress noted. PT q15 minute checks maintained by NA via visualization and cctv camera. Will continue to monitor
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
## 2017-08-13 NOTE — Unmapped (Signed)
ED Progress Note    August 13, 2017 2:51 PM    S  Ryan Robinson is a 42 y.o. male??transitioning to male??with a history of HIV (on ART), seizures, substance abuse, anorexia nervosa, panic disorder, bipolar disorder, borderline personality disorder, anxiety, and depression presented to the ED via [facility] for SI. She has been medically clear and evaluated by PES with plan for bed placement.  ??  O  Constitutional:??Alert and oriented, very anxious appearing  Eyes:??Conjunctivae are normal.??Pupils equal and reactive.   ENT  ??????????Head:??Normocephalic and atraumatic.  ??????????Nose: No epistaxis.  ??????????Mouth/Throat:??Mucous membranes are moist.  ??????????Neck:??No stridor.??No nuchal rigidity.  Hematological/Lymphatic/Immunilogical:??No cervical lymphadenopathy.  Cardiovascular:??Regular rate, regular rhythm. Normal and symmetric distal pulses are present in bilateral upper extremities.  Respiratory:??Normal respiratory effort. Breath sounds are clear to auscultation and equal bilaterally.  Gastrointestinal:??Soft and nontender.??There is no rebound tenderness, guarding or rigidity.  Musculoskeletal:??Nontender with normal range of motion in all extremities.              Right lower leg: No tenderness or edema.              Left lower leg: No tenderness or edema.  Neurologic: ??No gross focal neurologic deficits are appreciated.  Skin:??Skin is warm, dry and intact. No rash noted.  Psychiatric:  Tearful, anxious, splitting  ??  Vitals:    08/13/17 0700   BP: 110/75   Pulse: 76   Resp: 20   Temp: 37.1 ??C (98.7 ??F)   SpO2: 96%     A/P  -pending psychiatric bed placement.

## 2017-08-13 NOTE — Unmapped (Signed)
Ryan Robinson with PES is currently at bedside.
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
## 2017-08-13 NOTE — Unmapped (Signed)
Patient Rounding Complete, Pt is currently sitting in room watching Tv with no labored breathing. Will continue to monitor Pt and do 15 min checks. Pts belonging are locked in the locker that is coordinated with their room number. Pts bed is locked and in lowest position and a cordless call bell is in reach.

## 2017-08-13 NOTE — Unmapped (Signed)
Patient Rounding Complete, Pt is currently sitting in room watching tv with no labored breathing. Will continue to monitor Pt and do 15 min checks. Pts belonging are locked in the locker that is coordinated with their room number. Pts bed is locked and in lowest position and a cordless call bell is in reach.

## 2017-08-13 NOTE — Unmapped (Signed)
Pt has received a food tray.

## 2017-08-13 NOTE — Unmapped (Addendum)
Patient rounds completed. The following patient needs were addressed:  Pain, Toileting, Positioning;   LEFT side, Plan of Care and Bed Position Low . Pt currently resting in bed asleep. Pt respirations even and unlabored. No acute distress noted. PT q15 minute checks maintained by NA via visualization and cctv camera. Will continue to monitor

## 2017-08-13 NOTE — Unmapped (Signed)
Pt ambulated to restroom. Pt denies any needs at this time. Pt's friend called and pt made aware. WCM.

## 2017-08-13 NOTE — Unmapped (Addendum)
Gave pt  Color  Book and  Color pencils per request,

## 2017-08-13 NOTE — Unmapped (Signed)
Assumed care of pt, pt currently in chair, awake and writing. Pt inquiring about morning medications, this writer explained when medications would be brought in. Safety precautions in place.

## 2017-08-13 NOTE — Unmapped (Signed)
Patient rounds completed. The following patient needs were addressed:  Pain, Toileting, Positioning;   supine side, Plan of Care and Bed Position Low . Pt currently resting in bed asleep. Pt respirations even and unlabored. No acute distress noted. PT q15 minute checks maintained by NA via visualization and cctv camera. Will continue to monitor

## 2017-08-14 MED ORDER — ESTRADIOL 2 MG TABLET
ORAL_TABLET | ORAL | 0 refills | 0 days
Start: 2017-08-14 — End: 2017-08-14

## 2017-08-14 MED ORDER — TAMSULOSIN 0.4 MG CAPSULE
ORAL_CAPSULE | Freq: Every day | ORAL | 0 refills | 0.00000 days | Status: CP
Start: 2017-08-14 — End: 2017-08-14

## 2017-08-14 MED ORDER — MELATONIN 3 MG TABLET: 3 mg | each | 0 refills | 0 days

## 2017-08-14 MED ORDER — TAMSULOSIN 0.4 MG CAPSULE: 0 mg | capsule | Freq: Every day | 0 refills | 0 days | Status: AC

## 2017-08-14 MED ORDER — GABAPENTIN 300 MG CAPSULE
ORAL_CAPSULE | Freq: Three times a day (TID) | ORAL | 0 refills | 0.00000 days | Status: CP
Start: 2017-08-14 — End: 2017-10-27

## 2017-08-14 MED ORDER — BUPROPION HCL XL 150 MG 24 HR TABLET, EXTENDED RELEASE
ORAL_TABLET | 0 refills | 0 days | Status: SS
Start: 2017-08-14 — End: 2018-02-23

## 2017-08-14 MED ORDER — HYDROCHLOROTHIAZIDE 25 MG TABLET
ORAL_TABLET | Freq: Every morning | ORAL | 0 refills | 0.00000 days
Start: 2017-08-14 — End: 2017-08-14

## 2017-08-14 MED ORDER — HYDROXYZINE HCL 25 MG TABLET
ORAL_CAPSULE | Freq: Four times a day (QID) | ORAL | 0 refills | 0.00000 days | Status: CP | PRN
Start: 2017-08-14 — End: 2017-10-26

## 2017-08-14 MED ORDER — MELATONIN 3 MG TABLET
ORAL_TABLET | Freq: Every evening | ORAL | 0 refills | 0.00000 days | Status: CP | PRN
Start: 2017-08-14 — End: 2017-10-26

## 2017-08-14 MED ORDER — NICOTINE (POLACRILEX) 2 MG GUM: 2 mg | each | 0 refills | 0 days | Status: AC

## 2017-08-14 MED ORDER — SPIRONOLACTONE 100 MG TABLET: 100 mg | tablet | Freq: Two times a day (BID) | 0 refills | 0 days | Status: AC

## 2017-08-14 MED ORDER — VENTOLIN HFA 90 MCG/ACTUATION AEROSOL INHALER
RESPIRATORY_TRACT | 0 refills | 0 days
Start: 2017-08-14 — End: 2017-08-14

## 2017-08-14 MED ORDER — HYDROXYZINE HCL 25 MG TABLET: tablet | 0 refills | 0 days

## 2017-08-14 MED ORDER — ALBUTEROL SULFATE HFA 90 MCG/ACTUATION AEROSOL INHALER
RESPIRATORY_TRACT | 0 refills | 0.00000 days | Status: CP | PRN
Start: 2017-08-14 — End: 2017-10-26

## 2017-08-14 MED ORDER — NICOTINE (POLACRILEX) 2 MG GUM
BUCCAL | 0 refills | 0.00000 days | Status: CP | PRN
Start: 2017-08-14 — End: 2018-03-02

## 2017-08-14 MED ORDER — GABAPENTIN 300 MG CAPSULE: 900 mg | capsule | Freq: Three times a day (TID) | 0 refills | 0 days | Status: AC

## 2017-08-14 MED ORDER — SPIRONOLACTONE 100 MG TABLET
ORAL_TABLET | Freq: Two times a day (BID) | ORAL | 0 refills | 0.00000 days | Status: CP
Start: 2017-08-14 — End: 2017-08-14

## 2017-08-14 MED FILL — HYDROXYZINE HCL/25MG/TABS: HYDROXYZINE HCL/25MG/TABS | 7 days supply | Qty: 30 | Fill #0

## 2017-08-14 MED FILL — TIVICAY/50MG/TAB: TIVICAY/50MG/TAB | 30 days supply | Qty: 30 | Fill #0

## 2017-08-14 MED FILL — SPIRONOLACTONE/100MG/TABS: SPIRONOLACTONE/100MG/TABS | 30 days supply | Qty: 60 | Fill #0

## 2017-08-14 MED FILL — TAMSULOSIN/0.4MG/CAPS: TAMSULOSIN/0.4MG/CAPS | 30 days supply | Qty: 30 | Fill #0

## 2017-08-14 MED FILL — DESCOVY/200MG/25MG/TABS: DESCOVY/200MG/25MG/TABS | 30 days supply | Qty: 30 | Fill #0

## 2017-08-14 MED FILL — GABAPENTIN/300MG/CAPS: GABAPENTIN/300MG/CAPS | 30 days supply | Qty: 270 | Fill #0

## 2017-08-14 MED FILL — ESTRADIOL/2MG/TAB: ESTRADIOL/2MG/TAB | 30 days supply | Qty: 30 | Fill #0

## 2017-08-14 MED FILL — VENTOLIN HFA/90MCG/AER: VENTOLIN HFA/90MCG/AER | 16 days supply | Qty: 1 | Fill #0

## 2017-08-14 MED FILL — BUPROPION HCL XL/150MG/TB24: BUPROPION HCL XL/150MG/TB24 | 30 days supply | Qty: 90 | Fill #0

## 2017-08-14 MED FILL — MELATONIN/3MG/TABS: MELATONIN/3MG/TABS | 100 days supply | Qty: 100 | Fill #0

## 2017-08-14 MED FILL — HYDROCHLOROTHIAZIDE/25MG/TAB: HYDROCHLOROTHIAZIDE/25MG/TAB | 30 days supply | Qty: 30 | Fill #0

## 2017-08-14 MED FILL — NICOTINE POLACRILEX/2MG MINT/GUM: NICOTINE POLACRILEX/2MG MINT/GUM | 10 days supply | Qty: 110 | Fill #0

## 2017-08-14 NOTE — Unmapped (Signed)
Patient rounds complete. Patient in room drawing; patient upset that he is not being treated for his anxiety. Q 15 min safety check sheet maintained by this NA will continue to monitor via cctv for added safety measures.

## 2017-08-14 NOTE — Unmapped (Signed)
Patient rounding completed. Bed low and locked, Q15 min checks continue per protocol, patient visualized via CCTV by this RN at this time.

## 2017-08-14 NOTE — Unmapped (Signed)
Patient rounds completed. Patient sitting conversing with this RN. NAD noted at this time. Q15 min checks continued.

## 2017-08-14 NOTE — Unmapped (Signed)
Pt resting on stretcher. No acute distress noted. Respirations are even and unlabored. Safety precautions and observations continued. Will continue to reassess.

## 2017-08-14 NOTE — Unmapped (Signed)
Spoke with pt who states that she has called [place] and spoke with [facility] in admissions, who reports that pt would be eligible for admission to their 28-day program. Spoke directly with Pieter Partridge who confirms this information as pt has been there before and does have Medicare currently on file (this insurance is not listed in pt's Epic record). She states that CM should fax a facesheet, H&P, current med list and labs for referral to [phone]. She states there are no current openings until Monday and that, if accepted, staff can pick pt up from the ED to transport her to this facility. Spoke again with pt and confirmed plan for CM to fax referral.    Marcello Moores, MSW, LCSW-A  [facility]

## 2017-08-14 NOTE — Unmapped (Signed)
Pt resting on stretcher watching tv. Pt asked for ativan. Paged PES. PES denied ativan. Pt refused atarax. Airway patent, breathing even and unlabored, equal b/l chest rise and fall observed. NAD noted. Safety precautions maintained, q24min checks performed by NA. Will continue to monitor pt.

## 2017-08-14 NOTE — Unmapped (Signed)
The patient has been requesting Ativan. Nurses report patient has been crying, looks anxious and distraught but not displaying any behaviorally dangerous behaviors. The patient has Atarax ordered, was offered but patient refuses stating it was not helpful yesterday. Will not order Ativan at this time as patient has history of substance abuse and given patient will be in ER for next two days, it is believed if the patient receives Ativan she will continue to request it for the duration of her stay. Nurse caring for patient was told if behavior becomes dangerous in anyway to page psychiatry back.

## 2017-08-14 NOTE — Unmapped (Signed)
Went into restroom and watched pt shave with no issue. Pt shaved, returned razor intact to be destroyed, supplies disposed of. Pt remains calm and cooperative.

## 2017-08-14 NOTE — Unmapped (Signed)
Pt provide lunch tray

## 2017-08-14 NOTE — Unmapped (Signed)
Pt provided to meal  tray

## 2017-08-14 NOTE — Unmapped (Signed)
Patient calm and cooperative,talking with staff.No voiced complaint or concerns at present time.Patients safety sheet maintained per psych protocol.Call bell has been removed for safety.Will continue to monitor on cctv.

## 2017-08-14 NOTE — Unmapped (Signed)
Patient rounding completed. Bed low and locked, Q15 min checks continue per protocol.

## 2017-08-14 NOTE — Unmapped (Signed)
Pt provide breakfast tray.

## 2017-08-14 NOTE — Unmapped (Addendum)
Pt states Atarax did not relieve her anxiety. Patient stating anger that she is not being tx with a benzo such as the Ativan she continues to request. Patient has threatened to raise hell and has become verbally aggressive with this RN more than once. Reported this to PES at beginning of shift. PES had asked that staff encourage patient to take her Atarax. This RN has reinforced to patient multiple times that at this time no benzos will be given. Patient is currently calm and cooperative, sitting in chair next to nurses station reading, but periodically goes back to the subject of her anger regarding benzos.

## 2017-08-14 NOTE — Unmapped (Signed)
Pt resting on stretcher with eyes closed. Airway patent, breathing even and unlabored, equal b/l chest rise and fall observed. NAD noted. Safety precautions maintained, q20min checks performed by NA. Will continue to monitor pt.

## 2017-08-14 NOTE — Unmapped (Signed)
Pt in chair beside stretcher, eating lunch. Airway patent, breathing even and unlabored, equal b/l chest rise and fall observed. NAD noted. Safety precautions maintained, q35min checks performed by NA. Will continue to monitor pt.

## 2017-08-14 NOTE — Unmapped (Signed)
Patient rounds complete. Patient is requesting to shave per nurse; nurse to obtain order. Q 15 min safety check sheet maintained by this NA will continue to monitor via cctv for added safety measures.

## 2017-08-14 NOTE — Unmapped (Addendum)
Patient rounds complete. Patent is up talking on the phone no complaints noted at this time. Q 15 min safety check sheet maintained by this NA will continue to monitor via cctv for added safety

## 2017-08-14 NOTE — Unmapped (Signed)
Patient oob to restroom then back to room 46.No voiced complaints or concerns.Patients q 15  minutes safety sheet maintained per protocol.Will continue to view patient on cctv.

## 2017-08-14 NOTE — Unmapped (Signed)
Patient rounding completed. Bed low and locked, Q15 min checks continue per protocol. Pt up at nurses station talking on phone at this time.

## 2017-08-14 NOTE — Unmapped (Signed)
Pt to RN station to use phone

## 2017-08-14 NOTE — Unmapped (Signed)
Patient is resting well with eyes closed lying on left side.Nad is noted.Patients safety sheet maintained per psych protocol.No call bell in room due psych protocol..Will continue to monitor on cctv.

## 2017-08-14 NOTE — Unmapped (Addendum)
Patient rounds complete. Patient in room watching TV no complaints noted.Q 15 min safety check sheet maintained by this NA will continue to monitor via cctv for added safety measures.

## 2017-08-14 NOTE — Unmapped (Signed)
Patient rounding completed. Bed low and locked, Q15 min checks continue per protocol, patient visualized via CCTV.

## 2017-08-14 NOTE — Unmapped (Signed)
Pt agreed to take Atarax but threatening to raise hell if it doesn't work.

## 2017-08-14 NOTE — Unmapped (Addendum)
Pt tearful, accusing this RN of ignoring me, though RN has interacted with patient since coming on shift, pt asking for Ativan. Paged PES.

## 2017-08-14 NOTE — Unmapped (Signed)
Pt shaving in bathroom with supervision of NA. NAD

## 2017-08-14 NOTE — Unmapped (Signed)
Patient rounds complete. Patient up eating breakfast and taking morning medication. Q 15 min safety check sheet maintained by this NA will continue to monitor via cctv for added safety measures.

## 2017-08-14 NOTE — Unmapped (Signed)
Pt has received a food tray.

## 2017-08-14 NOTE — Unmapped (Signed)
Pt in chair beside stretcher, eating breakfast. Airway patent, breathing even and unlabored, equal b/l chest rise and fall observed. NAD noted. Safety precautions maintained, q102min checks performed by NA. Will continue to monitor pt.

## 2017-08-14 NOTE — Unmapped (Signed)
Report to [facility]. Care transferred at this time.

## 2017-08-14 NOTE — Unmapped (Signed)
Pt in common area talking to staff. Airway patent, breathing even and unlabored, equal b/l chest rise and fall observed. NAD noted. Safety precautions maintained, q42min checks performed by NA. Will continue to monitor pt.

## 2017-08-15 MED ORDER — ESTRADIOL 2 MG TABLET
ORAL_TABLET | Freq: Every day | ORAL | 0 refills | 0 days | Status: CP
Start: 2017-08-15 — End: 2017-10-26

## 2017-08-15 MED ORDER — BUPROPION HCL XL 450 MG 24 HR TABLET, EXTENDED RELEASE
ORAL_TABLET | Freq: Every day | ORAL | 0 refills | 0 days | Status: CP
Start: 2017-08-15 — End: 2018-03-02

## 2017-08-15 MED ORDER — HYDROCHLOROTHIAZIDE 25 MG TABLET
ORAL_TABLET | Freq: Every morning | ORAL | 0 refills | 0.00000 days | Status: CP
Start: 2017-08-15 — End: 2017-10-26

## 2017-08-15 NOTE — Unmapped (Signed)
Provided pt with breakfast tray

## 2017-08-15 NOTE — Unmapped (Signed)
Patient rounds completed. Pt in room resting in bed. Awakened by male peer yelling/cursing. Pt upset by peers behavior and yelled for her to shut up. Stated she was going to beat peers' ass today and go to jail. No no negative behavior from pt. Pt pleasant when speaking with Clinical research associate. Up to the bathroom and requested medication that she was supposed to get on previous shift. No further request at this time. Safety maintained. Will continue to monitor.

## 2017-08-15 NOTE — Unmapped (Signed)
Patient rounds completed. The following patient needs were addressed:  Pain, Toileting, Personal Belongings, Plan of Care, Call Bell in Reach and Bed Position Low .

## 2017-08-15 NOTE — Unmapped (Signed)
Patient rounds completed. Pt resting quietly in bed with eyes closed. No negative behavior noted or reported. Safety maintained.

## 2017-08-15 NOTE — Unmapped (Signed)
Report given to [place], Charity fundraiser. Care transferred at this time.

## 2017-08-15 NOTE — Unmapped (Signed)
Pt rounds complete. Pt resting in bed with eyes closed, respirations even and unlabored. Will continue to monitor Q 15 min and maintain safety check sheet.Bed low and locked . NAD

## 2017-08-15 NOTE — Unmapped (Signed)
Patient rounds completed. The following patient needs were addressed:  Pain, Toileting, Positioning;   RIGHT side, Personal Belongings, Plan of Care, Call Bell in Reach and Bed Position Low . Patient appears sleeping soundly at the current time. Full patient re-assessment limited at this time in order to promote patient rest and healing. Resps appear easy and unlabored. 0 s/s of pain or distress noted at this time. Wall mount, safety call bell in reach. Bed locked and in lowest position. Bed rails up x2. Patient belongings secured per protocol. Nursing will continue to monitor, complete all orders and provide comfort as able. Freq monitoring, psych/safety precautions and fall prevention maintained per protocol. 0 visitors at bedside at this time.

## 2017-08-15 NOTE — Unmapped (Signed)
Pt resting on stretcher with eyes closed. Airway patent, breathing even and unlabored, equal b/l chest rise and fall observed. NAD noted. Safety precautions maintained, q59min checks continued, pt monitored by CCTV. Will continue to monitor pt.

## 2017-08-15 NOTE — Unmapped (Signed)
Patient rounds completed. The following patient needs were addressed:  Pain, Toileting, Personal Belongings, Plan of Care, Call Bell in Reach and Bed Position Low . Patient sitting quietly on unit lobby couch reading. Patient denies pain, distress or need for assistance at this time. Ice given for juice patient had in room from meal tray. Lights and TV on unit turned off to promote patient rest and healing. Patient belongings remain secure per protocol. Wall mount, safety call bell in reach. Bed locked and in lowest position. Bed rails x2 up. Nursing will continue to monitor, complete all orders and provide comfort as able. Freq monitoring, psych/safety precautions and fall prevention maintained per protocol. 0 visitors at bedside at this time.

## 2017-08-15 NOTE — Unmapped (Signed)
Pt rounds complete. Pt lying in bed with eyes closed. No call bell for pt safety.Respirations even and unlabored. NAD

## 2017-08-15 NOTE — Unmapped (Signed)
Bed low and locked. No call bell for patient safety. Maintain safety checks and Q 15 min check sheet. NADPt being monitored by CCTV

## 2017-08-15 NOTE — Unmapped (Signed)
Patient rounds completed. Pt awake and conversing with staff. No needs at this time. Pleasant and cooperative with all requests. Safety maintained. Will continue to monitor.

## 2017-08-15 NOTE — Unmapped (Signed)
Report given to oncoming RN. Care assumed. 0 patient distress noted. Freq monitoring, SI precautions and patient safety/fall prevention maintained per protocol.

## 2017-08-15 NOTE — Unmapped (Signed)
Received page from CCM that [facility] will not continue pt's Suboxone during the 28-day program. Met with pt briefly and informed her of this. Pt states that she does not want transfer to [facility] if Suboxone will be discontinued. She remains interested in inpatient treatment options for further stabilization, mentioning [facility] [address] and [place]. Explained that referrals would be re-sent but that PES would plan to discuss updates with her tomorrow.    Spoke with CCM to provide update that pt is declining to transfer to [facility] due to their Suboxone policy.    Marcello Moores, MSW, LCSW-A  [facility]

## 2017-08-15 NOTE — Unmapped (Signed)
Patient rounds completed. The following patient needs were addressed:  Pain, Toileting, Positioning;   RIGHT side, Personal Belongings, Plan of Care, Call Bell in Reach and Bed Position Low . Patient currently lying in bed quietly with eyes closed. Patient appears to be settling to sleep. Full patient re-assessment limited at this time in order to decrease stimulation and promote patient rest and healing. Resps appear easy and unlabored. 0 s/s of pain or distress noted at this time. Wall mount, safety call bell in reach. Bed locked and in lowest position. Bed rails x2 up. Patient belongings secured per protocol. Nursing will continue to monitor, complete all orders and provide comfort as able. Freq monitoring, psych/safety precautions and fall prevention maintained per protocol. 0 visitors at bedside at this time.

## 2017-08-15 NOTE — Unmapped (Signed)
Pt resting on stretcher watching tv. Airway patent, breathing even and unlabored, equal b/l chest rise and fall observed. NAD noted. Safety precautions maintained, q25min checks continued, pt monitored by CCTV. Will continue to monitor pt.

## 2017-08-15 NOTE — Unmapped (Signed)
Patient rounding complete, patient is sitting outside in day area conversing with staff. Pt requested something to drink. Drink provided and patient is lying on bed watching TV at this time.

## 2017-08-15 NOTE — Unmapped (Signed)
Pt received  Lunch tray,

## 2017-08-15 NOTE — Unmapped (Signed)
Bed low and locked. No call bell for patient safety. Maintain safety checks and Q 15 min check sheet. Pt awake at this time.Marland Kitchen NAD

## 2017-08-15 NOTE — Unmapped (Signed)
Pt provided dinner tray.

## 2017-08-15 NOTE — Unmapped (Signed)
Patient rounds completed. Pt at the desk talking with Clinical research associate and NA. Pleasant, cooperative, and appropriate at this time. No needs at this time. Safety maintained. Will continue to monitor.

## 2017-08-15 NOTE — Unmapped (Signed)
Patient rounds complete. Patient has no complaints in room watching TV. Q 15 min. Q 15 min safety check sheet maintained by this NA will continue to monitor via cctv for added safety measures.

## 2017-08-15 NOTE — Unmapped (Addendum)
Patient rounds completed. The following patient needs were addressed:  Pain, Toileting, Personal Belongings, Plan of Care, Call Bell in Reach and Bed Position Low . Patient sitting in chair coloring and watching TV. Writer to bedside for full patient assessment and to introduce self to patient. Patient a&ox3, calm and cooperative. Patient pleasant, interactive, making occasional eye contact and able to answer questions appropriately. Patient able to follow direction and verbalize and demonstrate knowledge of teaching and information given. Patient denies current SI, HI, delusion or hallucinations. Patient denies agitation or aggression. 0 s/s noted. Patient reports + increased and ongoing anxiety. She reports PTSD symptoms, feelings of rejection and anxiety exacerbated by the outbursts of the other patients. Patient also c/o previous and ongoing insomnia. Patient aware that writer will speak to MD re same. Patient denies pain or further distress. Writer provided 1-1 nursing and ++ emotional support with goof effect. Patient denies additional needs at this time. Wall mount, safety call bell in reach. Bed locked and in lowest position. Bed rails x2 up. Patient belongings secured per protocol. Nursing will continue to monitor, complete all orders and provide comfort as able. Freq monitoring, psych/safety precautions and fall prevention maintained per protocol. 0 visitors at bedside at this time.

## 2017-08-15 NOTE — Unmapped (Signed)
Patient rounds completed. The following patient needs were addressed:  Pain, Toileting, Positioning;   RIGHT side, Personal Belongings, Plan of Care, Call Bell in Reach and Bed Position Low . Patient appears sleeping at current time and since last rounds. Patient occasionally repositioning but appears to quickly settle back to sleep. Resps easy and unlabored. 0 s/s of pain or distress noted. Wall mount, safety call bell in reach. Bed locked and maintained in lowest position. Bed rails up x1. Patient belongings secured per protocol. Nursing will continue to monitor, complete all orders and provide comfort as able. Freq monitoring, psych/safety precautions and fall prevention maintained per protocol. 0 visitors at bedside at this time.

## 2017-08-15 NOTE — Unmapped (Signed)
Patient rounds completed. The following patient needs were addressed:  Pain, Toileting, Personal Belongings, Plan of Care, Call Bell in Reach and Bed Position Low . Patient remains calm and appropriate. Patient frequently seeking the attention of staff. + conversation. Writer photocopied her art work per request and crayons offered. Patient intermittently watching TV. Patient denies pain, distress or need for additional assistance. Wall mount, safety call bell in reach. Bed locked and in lowest position. Bed rails x2 up. Patient belongings secured per protocol. Nursing will continue to monitor, complete all orders and provide comfort as able. Freq monitoring, psych/safety precautions and fall prevention maintained per protocol. 0 visitors at bedside at this time.

## 2017-08-15 NOTE — Unmapped (Signed)
Patient rounds completed. The following patient needs were addressed:  Pain, Toileting, Positioning;   RIGHT side, Personal Belongings, Plan of Care, Call Bell in Reach and Bed Position Low . Patient appears sleeping soundly at current time and since last rounds. Patient occasionally repositioning but appears to quickly settle back to sleep. Resps easy and unlabored. 0 s/s of pain or distress noted. Wall mount, safety call bell in reach. Bed locked and maintained in lowest position. Bed rails up x2. Patient belongings secured per protocol. Nursing will continue to monitor, complete all orders and provide comfort as able. Freq monitoring, psych/safety precautions and fall prevention maintained per protocol. 0 visitors at bedside at this time.

## 2017-08-15 NOTE — Unmapped (Signed)
Patient rounds completed. Pt resting quietly in bed with eyes closed. No negative behavior noted or reported. Safety maintained. Will continue to monitor.

## 2017-08-15 NOTE — Unmapped (Signed)
Report received from offgoing RN, Etter Sjogren. Care assumed. Patient walking about unit lobby. During shift report multiple patients became agitated and 1 combative. This patient return to room and noted to be in chair drawing post. 0 patient complaint or distress noted. Wall mount, safety call bell in reach. Bed locked and in lowest position. BEd rails x2 up. Patient belongings secured per protocol. Nursing will continue to monitor, complete all orders and provide comfort as able. Freq monitoring, psych/safety precautions and fall prevention maintained per protocol. 0 visitors at bedside at this time.

## 2017-08-16 NOTE — Unmapped (Signed)
Pt rounds complete. Pt resting in bed with eyes closed, respirations even and unlabored. Will continue to monitor Q 15 min and maintain safety check sheet.Bed low and locked . NAD

## 2017-08-16 NOTE — Unmapped (Signed)
Meal tray giving to the patient

## 2017-08-16 NOTE — Unmapped (Signed)
ED Progress Note    August 16, 2017 11:55 AM    August 13, 2017 2:51 PM  ??  S  Ms. Tippin is a 41 y.o. male??transitioning to male??with a history of HIV (on ART), seizures, substance abuse, anorexia nervosa, panic disorder, bipolar disorder, borderline personality disorder, anxiety, and depression presented??to the ED via [facility] for SI. She has been medically clear and evaluated by PES with plan for bed placement.  ??  O  Constitutional:??Alert and oriented, anxious appearing  Eyes:??Conjunctivae are normal.??Pupils equal and reactive.   ENT  ??????????Head:??Normocephalic and atraumatic.  ??????????Nose: No epistaxis.  ??????????Mouth/Throat:??Mucous membranes are moist.  ??????????Neck:??No stridor.??No nuchal rigidity.  Hematological/Lymphatic/Immunilogical:??No cervical lymphadenopathy.  Cardiovascular:??Regular rate, regular rhythm. Normal and symmetric distal pulses are present in bilateral upper extremities.  Respiratory:??Normal respiratory effort. Breath sounds are clear to auscultation and equal bilaterally.  Gastrointestinal:??Soft and nontender.??There is no rebound tenderness, guarding or rigidity.  Musculoskeletal:??Nontender with normal range of motion in all extremities.  ????????????????????????Right lower leg: No tenderness or edema.  ????????????????????????Left lower leg: No tenderness or edema.  Neurologic: ??No gross focal neurologic deficits are appreciated.  Skin:??Skin is warm, dry and intact. No rash noted.  Psychiatric:  Calm and cooperative  ??  Vitals:    08/16/17 1141   BP: 123/84   Pulse:    Resp: 16   SpO2: 98%     A/P  - Pending bed placement

## 2017-08-16 NOTE — Unmapped (Signed)
Quiet in bed with eyes closed. Respirations even and unlabored. q15 minute observation.
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
## 2017-08-16 NOTE — Unmapped (Signed)
Patient rounds completed. Pt is resting in bed in no apparent distress, watching television. No requests or complaints at this time. Bed in lowest position and locked; call bell and patient belongings within reach.
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
## 2017-08-16 NOTE — Unmapped (Signed)
Patient rounds completed. Pt in room sitting in chair preparing to eat dinner. Pt remains upset regarding conversation with SW earlier before his shower. pt cryin and anxious feels that the interaction with SW set him back. Pt's mood is no longer cheerful, he was noted to be crying at times. Emotional support offered to pt by writer and pt was helped to talk through her frustrations. No negative behavior noted or reported. Pt reports that pt relations is coming to speak with her in the am and she will voice her concern then. No further complaints. Safety maintained. Will continue to monitor.

## 2017-08-16 NOTE — Unmapped (Addendum)
Awake quiet in bed. Reports she is upset and felt suicidal. Recounts upset with SW earlier for discussing information that she felt other patients and staff heard that would make people change their behavior with her. Doubts that staff want her in unit or care about her. RN reinforced patient is a valued person and staff care about her. Validated that earlier incident was upsetting to her. Encouraged to talk upset feelings out with SW  to help with emotional upset. Encouraged to look at intent of action by SW rather than the mistake itself as people are not perfect. Willing to consider SW was not intentionally trying to upset her, but did not want to speak to SW at this time. Request space at this time to process emotions and for night medication to be given so she could go to sleep. Made aware of staff availability. q15 minute observation.

## 2017-08-16 NOTE — Unmapped (Signed)
Patient rounds completed. Patient is sitting in the hallway talking to her nurse.

## 2017-08-16 NOTE — Unmapped (Signed)
Patient rounds completed. Nurse is in the room with the patient.

## 2017-08-16 NOTE — Unmapped (Signed)
Duration of Intervention: 5 minutes    SW met with pt briefly. Pt did not remember this SW even after explanation. Pt did not want to sign agreement to text stating she would just call the ID clinic nurse with any needs. Pt expressed frustration over placement not allowing suboxone use and stated she would not go without it. Pt also reported that a nurse exclaimed loudly that she had pt HIV rx for her and pt intends to talk to patient relations. Pt stated she did not need help with this. SW could not stay longer and will check in with pt in one week if able.     Bradly Bienenstock LCSWA, CHES

## 2017-08-16 NOTE — Unmapped (Signed)
Report given to [facility]. Patient care transferred at this time.

## 2017-08-16 NOTE — Unmapped (Signed)
Patient rounds completed. Pt in room watching television. No needs at this time. Safety maintained. Will continue to monitor.

## 2017-08-16 NOTE — Unmapped (Signed)
Patient rounds completed. Pt is talking on the unit phone. No requests or complaints at this time. q15 min check sheet, safety precautions maintained.

## 2017-08-16 NOTE — Unmapped (Signed)
HS medication given without complication.

## 2017-08-16 NOTE — Unmapped (Signed)
Assumed pt care. Report received from Kaylyn Layer, RN.

## 2017-08-16 NOTE — Unmapped (Signed)
Report given to [facility]. Patient care transferred at this time.

## 2017-08-16 NOTE — Unmapped (Signed)
Ambulated to bathroom

## 2017-08-16 NOTE — Unmapped (Signed)
Received call from Case Manager Danette Rutkowski. She confirms that [facility] was waiting on pt's Medicare, Medicaid info which has been sent to their facility now by her. They are now reviewing the pt's information and will let us know if they will accept the pt and if they have availability. Patient rounds completed. Pt sitting on side of bed, watching television. No requests or complaints at this time. Bed in lowest position and locked. q15 min check sheet, safety precautions maintained.

## 2017-08-16 NOTE — Unmapped (Signed)
Patient rounds completed. Patient is standing in the hall at the nurses station talking to the nurse.

## 2017-08-16 NOTE — Unmapped (Signed)
Patient rounds complete. Patient resting in room watching television. Q 15 min safety check sheet maintained by this NA will continue to monitor via cctv for added safety measures.

## 2017-08-16 NOTE — Unmapped (Signed)
Patient rounds completed. Patient is sitting in the recliner in her room eating lunch in no apparent distress.

## 2017-08-16 NOTE — Unmapped (Signed)
TV on in room. In bed with eyes closed. Tv turned off without complication. q15 minute observation.

## 2017-08-16 NOTE — Unmapped (Signed)
Transport arrived, looking to take pt to [facility]. As far as this RN knew, the plan was to attempt to admit pt to [facility], and we are no longer seeking admission through [facility]. Pt affirms this, states the plan changed-- pt states she is frustrated that it was not communicated for transportation to [facility] to be cancelled. Documentation seems to support that the plan is not to admit through [facility], although it is somewhat confusing to follow. Transportation staff notified their services are not currently needed, blameless apology offered.

## 2017-08-16 NOTE — Unmapped (Signed)
Patient rounds completed. Pt is resting in chair at bedside, drawing. Copies of drawing made for pt per request. No other requests or complaints at this time. Bed in lowest position and locked. q15 min check sheet, safety precautions maintained.

## 2017-08-16 NOTE — Unmapped (Signed)
Reports sleeping on and off states it wasn't the best. Thanked RN for being nice to her. Made aware staff was supportive of her feeling better.

## 2017-08-16 NOTE — Unmapped (Signed)
Report received from [place], [place]. Patient care transferred at this time.

## 2017-08-17 NOTE — Unmapped (Signed)
Pt appears to be asleep at this time pt has no NAD noted at this time pt will continue to be monitored by CCTV

## 2017-08-17 NOTE — Unmapped (Signed)
Patient rounds completed. Patient is laying on her right side in the bed in no apparent distress.

## 2017-08-17 NOTE — Unmapped (Signed)
Pt provided with evening meal tray

## 2017-08-17 NOTE — Unmapped (Signed)
Report given to [facility]. Patient care transferred at this time.

## 2017-08-17 NOTE — Unmapped (Signed)
Apple juice giving to the patient per request.

## 2017-08-17 NOTE — Unmapped (Signed)
Patient transferred to [facility] with [facility] per order, transported off unit by [facility] security and unit staff. Required paperwork accompanies patient. All patient belongings accompanied patient off unit. Patient dressed in personal clothing and footwear. No signs or symptoms of distress noted, no distress reported. Denies pain. Gate steady. Mood appropriate to circumstances. Denies pain. Transfer of care completed.

## 2017-08-17 NOTE — Unmapped (Signed)
Patient rounds completed. Patient is sitting in the recliner in her room waiting for her meal tray to get here.

## 2017-08-17 NOTE — Unmapped (Signed)
Pt is asleep pt has NAD noted at this time will continue to monitor

## 2017-08-17 NOTE — Unmapped (Signed)
Patient rounds completed.  Pt sitting at nurses station speaking with this RN about the treatment facility she will be going to tomorrow. Pt expresses being nervous about meeting new people and opening up to them. But pt also sts she is excited about her new chapter. Denies needs at this time. Safety precautions maintained.

## 2017-08-17 NOTE — Unmapped (Signed)
Meal tray giving to the patient. Patient states her tray is not right and requested another meal be ordered.

## 2017-08-17 NOTE — Unmapped (Signed)
Patient rounds completed. Patient is in bathroom.

## 2017-08-17 NOTE — Unmapped (Signed)
Escorted patient to the bathroom to apply her makeup prior to leaving the unit.

## 2017-08-17 NOTE — Unmapped (Signed)
Patient escorted to the bathroom to shave.

## 2017-08-17 NOTE — Unmapped (Signed)
Patient to bathroom and back to room.

## 2017-08-17 NOTE — Unmapped (Signed)
Patient rounds completed. Pt lying in bed on Rt side sleeping. RR even and unlabored. NAD noted. Safety precautions maintained.

## 2017-08-17 NOTE — Unmapped (Signed)
Patient rounds completed. Patient is sitting at the nursing station speaking with RN. Patient appears to be in a good mood. No needs stated by patient at this time. Will continue to monitor.

## 2017-08-17 NOTE — Unmapped (Signed)
Patient rounds complete. Patient is resting on the bed. No needs stated at this time. No call bell for patient safety. Respirations even and unlabored. Continue 15 minute safety checks.

## 2017-08-17 NOTE — Unmapped (Signed)
Patient report received from [place], [place]. Patient in room, resting quietly in room. Respirations deep, even unlabored. No distress noted or reported. No signs or symptoms of pain noted or reported. Safety bed in low, locked position per unit protocol. Non-skid socks and patient ID band present on patient. Call bell accessible and bedside table accessible. Patient remains in secured area on unit, dressed in gown and scrubs per unit protocol. Patient ID band present, non-skid socks on patient. Remains on continuous CCTV observation and 15 minute safety checks. Will continue to monitor.

## 2017-08-17 NOTE — Unmapped (Signed)
Called to check on meal tray staff stated it left the cafeteria at 1351 and should be here shortly.

## 2017-08-17 NOTE — Unmapped (Signed)
Patient rounds completed. Patient in common area of unit, socializing with staff at this time. Respirations even and unlabored. Mood is pleasant and cooperative, openly talking about personal transition process. Affect is bright and varied, appropriate. No acute distress noted or reported. No signs or symptoms of pain noted or reported. Safety bed in low, locked position per unit protocol. Bedside table and call bell accessible. Patient remains in secured area on unit, dressed in gown and scrubs per unit protocol. Non-skid socks and patient ID band present on patient.  Remains on continuous CCTV observation and 15 minute safety checks. Will continue to monitor.

## 2017-08-17 NOTE — Unmapped (Signed)
Patient rounds completed. Patient in chair at bedside, finishing breakfast at this time. Respirations even and unlabored. Mood is calm, pleasant, cooperative.  No acute distress noted or reported. Reports continued vague SI, no plan or intent. No signs or symptoms of pain noted or reported. Safety bed in low, locked position per unit protocol. Bedside table and call bell accessible. Patient remains in secured area on unit, dressed in gown and scrubs per unit protocol. Non-skid socks and patient ID band present on patient.  Remains on continuous CCTV observation and 15 minute safety checks. Will continue to monitor.

## 2017-08-17 NOTE — Unmapped (Signed)
Patient rounds completed.  Pt awake, in the bathroom. NAD noted. Safety precautions maintained.

## 2017-08-17 NOTE — Unmapped (Signed)
Report provided to [place] at [facility]. Patient is preparing for transport, MD aware. Patient rounds completed. Patient in room at this time. Respirations even and unlabored. Mood is calm, cooperative.  No acute distress noted or reported. No signs or symptoms of pain noted or reported. Safety bed in low, locked position per unit protocol. Bedside table and call bell accessible. Patient remains in secured area on unit, dressed in gown and scrubs per unit protocol. Non-skid socks and patient ID band present on patient.  Remains on continuous CCTV observation and 15 minute safety checks. Will continue to monitor until discharge to transport is completed.

## 2017-08-17 NOTE — Unmapped (Signed)
Patient rounds completed. Pt lying in bed sleeping. RR even and unlabored. NAD noted. Safety precautions maintained.

## 2017-08-17 NOTE — Unmapped (Signed)
Patient rounds completed. Nursing staff in talking with the patient.

## 2017-08-17 NOTE — Unmapped (Signed)
Report given, care transferred to Misty Stanley, RN.

## 2017-08-17 NOTE — Unmapped (Signed)
Patient escorted to [facility] by Clinical research associate and hospital to take a shower.

## 2017-08-17 NOTE — Unmapped (Signed)
Per request of case manager, Clinical research associate and patient checked for complete medication delivery from pharmacy of current/filled medications for patient's use in treatment center. All medications accounted for per patient and current medication report. Case manager aware.

## 2017-08-17 NOTE — Unmapped (Signed)
Pt seen skipping through halls. Pt reports she is happy today because she may be leaving tomorrow.  Denies SI/HI/AVH. Pt eating salad and cleaning room. Denies needs. NAD noted. Safety precautions maintained.

## 2017-08-17 NOTE — Unmapped (Signed)
Patient rounds completed. Pt tossing and turning in bed. NAD noted. Safety precautions maintained.

## 2017-08-17 NOTE — Unmapped (Signed)
Patient rounds completed. Pt is conversing pleasantly with this RN at this nursing station. No requests or complaints at this time. q15 min check sheet, safety precautions maintained.

## 2017-08-17 NOTE — Unmapped (Signed)
Patient rounds completed. Patient is sitting in the recliner in her room in no apparent distress.

## 2017-08-26 NOTE — Unmapped (Signed)
Patient is considered out of care, with no office visit since 12/10/16. Bridge Counselor attempted to contact pt to schedule appointment for retention. No contact was made.  A discreet voice message was left asking for pt to return the writer's call.     Also let pt know his medication assistance has lapsed of March 31st (HMAP)    Duration of Service: 3 minutes    Sherilynn Phen
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
## 2017-09-08 NOTE — Unmapped (Signed)
Duration of Intervention: 5 minutes    SW called pt phone and left voicemail to call back. Unsure if pt has been discharged from [facility].     Bradly Bienenstock LCSWA, CHES
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
## 2017-09-24 NOTE — Unmapped (Signed)
Duration of Intervention: 10 minutes    Pt called stating she received her [employer]OPWA voucher and was approved for an apartment at [employer] in [place]. Pt has been working with Restaurant manager, fast food. Pt states she owes $1550 for first months and deposit and Contessa Sawyer cannot help with this amount. SW informed pt our Hope fund cannot help with first month or security deposit. SW informed pt to call 211 for financial assistance resources. Pt mentioned she is going to [facility] Monday and will ask for help in accessing resources.     Bradly Bienenstock LCSWA, CHES

## 2017-10-04 NOTE — Unmapped (Signed)
[facility] Refill Coordination Note  Specialty Medication(s): Tivicay 50mg , Descovy 200-25mg   Additional Medications shipped: tamsulosin, gabapentin, wellbutrin, hctz    Ryan Robinson, DOB: 20-Feb-1976  Phone: [phone] (home) , Alternate phone contact: N/A  Phone or address changes today?: No  All above HIPAA information was verified with patient.  Shipping Address: [address] [place] [address]   Insurance changes? No    Completed refill call assessment today to schedule patient's medication shipment from the [facility] [phone]).      Confirmed the medication and dosage are correct and have not changed: Yes, regimen is correct and unchanged.    Confirmed patient started or stopped the following medications in the past month:  No, there are no changes reported at this time.    Are you tolerating your medication?:  Ryan Robinson reports tolerating the medication.    ADHERENCE    Did you miss any doses in the past 4 weeks? No missed doses reported.    FINANCIAL/SHIPPING    Delivery Scheduled: Yes, Expected medication delivery date: 10/07/17     The patient will receive an FSI print out for each medication shipped and additional FDA Medication Guides as required.  Patient education from [place] or Ryan Robinson may also be included in the shipment    Ryan Robinson did not have any additional questions at this time.    Delivery address validated in FSI scheduling system: Yes, address listed in FSI is correct.    We will follow up with patient monthly for standard refill processing and delivery.      Thank you,  Ryan Robinson   [facility] Specialty Pharmacist

## 2017-10-06 MED FILL — HYDROCHLOROTHIAZIDE/25MG/TABS: HYDROCHLOROTHIAZIDE/25MG/TABS | 30 days supply | Qty: 30 | Fill #6

## 2017-10-06 MED FILL — GABAPENTIN/300MG/CAPS: GABAPENTIN/300MG/CAPS | 30 days supply | Qty: 360 | Fill #6

## 2017-10-06 MED FILL — TAMSULOSIN/0.4MG/CAP: TAMSULOSIN/0.4MG/CAP | 30 days supply | Qty: 30 | Fill #6

## 2017-10-06 MED FILL — DESCOVY/200MG/25MG/TABS: DESCOVY/200MG/25MG/TABS | 30 days supply | Qty: 30 | Fill #0

## 2017-10-06 MED FILL — BUPROPION HCL XL/150MG/TB24: BUPROPION HCL XL/150MG/TB24 | 30 days supply | Qty: 90 | Fill #6

## 2017-10-06 MED FILL — TIVICAY/50MG/TAB: TIVICAY/50MG/TAB | 30 days supply | Qty: 30 | Fill #0

## 2017-10-14 NOTE — Unmapped (Signed)
Pt returned BC call to inform her that she spoke to Anita(nurse) yesterday and she will call the ID clinic on Monday to schedule an appointment.  Pt stated that she is moving and she did not want to schedule anything until she knows her schedule.     Ryan Robinson

## 2017-10-14 NOTE — Unmapped (Signed)
Patient is considered out of care, with no office visit since 12/10/2016. Bridge Counselor attempted to contact pt to schedule appointment for retention. No contact was made.  A discreet voice message was left asking for pt to return the writer's call.     Duration of Service: 3 minutes    Sherilynn Phen

## 2017-10-25 ENCOUNTER — Emergency Department: Admit: 2017-10-25 | Discharge: 2017-10-28 | Disposition: A | Discharge status: Home / Self Care | Payer: MEDICARE

## 2017-10-25 ENCOUNTER — Ambulatory Visit: Admit: 2017-10-25 | Discharge: 2017-10-28 | Disposition: A | Discharge status: Home / Self Care | Payer: MEDICARE

## 2017-10-25 LAB — URINALYSIS WITH CULTURE REFLEX
BILIRUBIN UA: NEGATIVE
GLUCOSE UA: NEGATIVE
KETONES UA: NEGATIVE
NITRITE UA: NEGATIVE
PH UA: 5 (ref 5.0–9.0)
PROTEIN UA: NEGATIVE
RBC UA: 10 /HPF — ABNORMAL HIGH (ref <=3)
SPECIFIC GRAVITY UA: 1.005 (ref 1.003–1.030)
SQUAMOUS EPITHELIAL: 1 /HPF (ref 0–5)
UROBILINOGEN UA: 0.2

## 2017-10-25 LAB — BILIRUBIN UA: Lab: NEGATIVE

## 2017-10-26 DIAGNOSIS — F99 Mental disorder, not otherwise specified: Principal | ICD-10-CM

## 2017-10-26 LAB — TOXICOLOGY SCREEN, URINE
AMPHETAMINE SCREEN URINE: <500
BENZODIAZEPINE SCREEN, URINE: <200
CANNABINOID SCREEN URINE: <20
METHADONE SCREEN, URINE: <300
OPIATE SCREEN URINE: <300

## 2017-10-26 LAB — AMPHETAMINE SCREEN URINE: Lab: <500

## 2017-10-26 NOTE — Unmapped (Signed)
Patient received meal tray 
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
## 2017-10-26 NOTE — Unmapped (Signed)
Ryan Harry, NP informed patient states Wellbutrin is a home medication and patient is requesting a dose.  Ryan Harry, NP requests patient obtain numbers from her cell phone.  [name], [place] currently assisting patient.    Patient continues to decline to have her blood drawn.

## 2017-10-26 NOTE — Unmapped (Signed)
Ryan Robinson, Peer Support Specialist is currently at bedside.
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
## 2017-10-26 NOTE — Unmapped (Signed)
Burnett Harry, NP is currently at bedside.
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
## 2017-10-26 NOTE — Unmapped (Signed)
Patient is currently in room sleeping at this.  Patient can be seen through CCTV VM. Safety sweep performed in room. Patient calm and cooperative at this time. Call bell not in reach due to safety precautions. Will continue to monitor through cctv and q15 minute check sheet.

## 2017-10-26 NOTE — Unmapped (Signed)
Rounded on pt. No lines or cords present. Pt items in locker. Bed low and locked, side rails up. Pt denies needs at this time. 15 min checks maintained.   Pt sleeping at time of rounding.
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
## 2017-10-26 NOTE — Unmapped (Signed)
Patient rounds completed.  Patient is currently sitting up and eating lunch in her assigned room.  Safety precautions continued.

## 2017-10-26 NOTE — Unmapped (Signed)
Pt states that she is having trouble with thoughts of wanting to hurt herself with drugs. Pt states that she wants to od

## 2017-10-26 NOTE — Unmapped (Signed)
Patient is continuing to decline to have her blood drawn.  She stated I'm in enough pain.  Patient states she will reconsider after ibuprofen takes effect.

## 2017-10-26 NOTE — Unmapped (Signed)
Patient is currently in room sleeping this time..  Patient can be seen through CCTV VM. Safety sweep performed in room. Patient calm and cooperative at this time. Call bell is not  in reach due to safety risk. Will continue to monitor through cctv and q15 minute check sheet.
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
## 2017-10-26 NOTE — Unmapped (Signed)
Patient is currently using the phone at the nurses station.

## 2017-10-26 NOTE — Unmapped (Signed)
Patients room changed to 44 per her request.

## 2017-10-26 NOTE — Unmapped (Signed)
Patient rounds completed.  Pharmacy staff is currently at bedside.

## 2017-10-26 NOTE — Unmapped (Signed)
[facility] staff is currently at bedside.

## 2017-10-26 NOTE — Unmapped (Signed)
Patient rounds completed. The following patient needs were addressed:  Patient rounds complete;Patient appears in NAD. Bed is secured, call bell available, belongings secured, q15 min checks maintained via CCTV and RN's assistance.     Patient sitting on couch in common area awaiting new room assignment.
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
## 2017-10-26 NOTE — Unmapped (Signed)
Pt is sleeping on bed at this time. Chest is rising and falling. NAD noted at this time. Bed is in lowest position and secured in place. Nursing will continue to monitor with q15 checks and continuous video monitoring.

## 2017-10-26 NOTE — Unmapped (Signed)
Patient returned from X-ray 

## 2017-10-26 NOTE — Unmapped (Signed)
Patient escorted to Xray accompanied by 2NAs and hospital security via wheelchair.

## 2017-10-26 NOTE — Unmapped (Signed)
Request made to pharmacy for medication.

## 2017-10-26 NOTE — Unmapped (Signed)
Adria Dill, NP is currently at bedside.

## 2017-10-26 NOTE — Unmapped (Signed)
Rounded on pt. No lines or cords present. Pt items in locker. Bed low and locked, side rails up. Pt denies needs at this time. 15 min checks maintained.   Pt. Sitting in bed, finished breakfast and just finished talking w/ psychiatry.

## 2017-10-26 NOTE — Unmapped (Addendum)
Pt refusing to be stuck for blood labs. Pt will provide urine sample when possible. Pt provided with an extra dinner tray. A full tray will be ordered shortly.

## 2017-10-26 NOTE — Unmapped (Signed)
Pt has 2 white belonging bags and 3 handbags

## 2017-10-26 NOTE — Unmapped (Signed)
Patient asked for clarification regarding wellbutrin dose.  Burnett Harry, NP informed.

## 2017-10-26 NOTE — Unmapped (Signed)
Patient is currently in room laying down at this time. Patient concerned about meal tray Meal tray has been placed..  Patient can be seen through CCTV VM. Safety sweep performed in room. Patient calm and cooperative at this time. Call bell in reach. Will continue to monitor through cctv and q15 minute check sheet.

## 2017-10-26 NOTE — Unmapped (Signed)
Rounded on pt. No lines or cords present. Pt items in locker. Bed low and locked, side rails up. Pt denies needs at this time. 15 min checks maintained.     Pt asleep until peer support came in to talk w/ pt n-65min.

## 2017-10-26 NOTE — Unmapped (Signed)
Patient is currently sitting at nursing station reading a magazine and talking with staff. Patient can be seen through CCTV VM. Safety sweep performed in room. Patient calm and cooperative at this time. Call bell in reach. Will continue to monitor through cctv and q15 minute check sheet.

## 2017-10-26 NOTE — Unmapped (Signed)
Patient rounds completed.  Patient is currently sitting up in bed speaking with a staff member from [facility].  Will continue to monitor.
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
## 2017-10-26 NOTE — Unmapped (Signed)
Valentina Gu, Peer Support Specialist is currently at bedside.

## 2017-10-26 NOTE — Unmapped (Signed)
Patient rounds completed.  Patient is current in her assigned room.  Assistance provided to reposition the bed.  Patient appears calm at this time.  Denies questions and concerns.  Safety precautions continued.
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
## 2017-10-26 NOTE — Unmapped (Signed)
Patient is currently at nursing station talking with staff. Patient came in with several bags of belongings. 2 bags were placed in bottom row of assigned area and 2 were placed in top row of assigned area. Patient can be seen through CCTV VM. Safety sweep performed in room. Patient calm and cooperative at this time. Call bell in reach. Will continue to monitor through cctv and q15 minute check sheet.

## 2017-10-26 NOTE — Unmapped (Signed)
Patient rounds completed. The following patient needs were addressed:  Patient rounds complete;Patient appears in NAD. Bed is secured, call bell available, belongings secured, q15 min checks maintained via CCTV and RN's assistance.     Patient is in hallway speaking with pharmacy and peer support.

## 2017-10-27 MED ORDER — DOLUTEGRAVIR 50 MG TABLET
Freq: Every day | ORAL | 0 refills | 0.00000 days | Status: CP
Start: 2017-10-27 — End: 2017-10-27

## 2017-10-27 MED ORDER — HYDROCHLOROTHIAZIDE 25 MG TABLET: 25 mg | tablet | Freq: Every morning | 0 refills | 0 days | Status: AC

## 2017-10-27 MED ORDER — BUPROPION HCL XL 150 MG 24 HR TABLET, EXTENDED RELEASE
ORAL_TABLET | Freq: Every day | ORAL | 0 refills | 0.00000 days | Status: SS
Start: 2017-10-27 — End: 2018-02-23
  Filled 2018-01-25: qty 90, 30d supply, fill #0

## 2017-10-27 MED ORDER — MELATONIN 3 MG TABLET
Freq: Every evening | ORAL | 0 refills | 0.00000 days | Status: CP
Start: 2017-10-27 — End: 2018-03-02

## 2017-10-27 MED ORDER — MEDROXYPROGESTERONE 10 MG TABLET: 10 mg | tablet | 0 refills | 0 days

## 2017-10-27 MED ORDER — EMTRICITABINE 200 MG-TENOFOVIR ALAFENAMIDE FUMARATE 25 MG TABLET: 1 | tablet | Freq: Every day | 0 refills | 0 days | Status: AC

## 2017-10-27 MED ORDER — BUPRENORPHINE 4 MG-NALOXONE 1 MG SUBLINGUAL FILM
ORAL_FILM | Freq: Every evening | SUBLINGUAL | 0 refills | 0 days | Status: CP
Start: 2017-10-27 — End: 2017-10-28

## 2017-10-27 MED ORDER — MELATONIN 3 MG TABLET: 6 mg | tablet | Freq: Every evening | 0 refills | 0 days | Status: AC

## 2017-10-27 MED ORDER — HYDROCHLOROTHIAZIDE 25 MG TABLET
ORAL_TABLET | Freq: Every morning | ORAL | 0 refills | 0.00000 days | Status: CP
Start: 2017-10-27 — End: 2017-10-27
  Filled 2018-01-25: qty 30, 30d supply, fill #0

## 2017-10-27 MED ORDER — SPIRONOLACTONE 100 MG TABLET: 100 mg | tablet | 0 refills | 0 days

## 2017-10-27 MED ORDER — BUPROPION HCL XL 150 MG 24 HR TABLET, EXTENDED RELEASE: 450 mg | tablet | 0 refills | 0 days | Status: SS

## 2017-10-27 MED ORDER — SPIRONOLACTONE 100 MG TABLET
ORAL_TABLET | Freq: Two times a day (BID) | ORAL | 0 refills | 0.00000 days | Status: CP
Start: 2017-10-27 — End: 2018-03-02

## 2017-10-27 MED ORDER — EMTRICITABINE 200 MG-TENOFOVIR ALAFENAMIDE FUMARATE 25 MG TABLET
ORAL_TABLET | Freq: Every day | ORAL | 0 refills | 0.00000 days | Status: CP
Start: 2017-10-27 — End: 2017-10-27

## 2017-10-27 MED ORDER — MEDROXYPROGESTERONE 10 MG TABLET
ORAL_TABLET | Freq: Every day | ORAL | 0 refills | 0.00000 days | Status: CP
Start: 2017-10-27 — End: 2018-06-03

## 2017-10-27 MED ORDER — BUPRENORPHINE 8 MG-NALOXONE 2 MG SUBLINGUAL FILM
0 refills | 0 days
Start: 2017-10-27 — End: 2017-10-27

## 2017-10-27 MED ORDER — DOLUTEGRAVIR 50 MG TABLET: 50 mg | each | 0 refills | 0 days

## 2017-10-27 MED ORDER — TAMSULOSIN 0.4 MG CAPSULE
ORAL_CAPSULE | Freq: Every day | ORAL | 0 refills | 0.00000 days | Status: CP
Start: 2017-10-27 — End: 2018-03-02
  Filled 2018-01-25: qty 30, 30d supply, fill #0

## 2017-10-27 MED ORDER — TAMSULOSIN 0.4 MG CAPSULE: 0 mg | capsule | Freq: Every day | 0 refills | 0 days | Status: AC

## 2017-10-27 MED FILL — MELATONIN/3MG/TABS: MELATONIN/3MG/TABS | 100 days supply | Qty: 100 | Fill #0

## 2017-10-27 MED FILL — MEDROXYPROGEST AC/10MG/TAB: MEDROXYPROGEST AC/10MG/TAB | 14 days supply | Qty: 14 | Fill #0

## 2017-10-27 NOTE — Unmapped (Signed)
Patient rounds Completed. Patient is awake, call bell is accessible, bed is locked in lowest position. Personal belongings secured per unit protocol. Patient denies any needs at this time. Every 15 minutes safety checks continues.

## 2017-10-27 NOTE — Unmapped (Signed)
Patient rounds  Completed. Patient is awake, call bell is accessible, bed is locked in lowest position. Personal belongings secured per unit protocol. Patient denies any needs at this time.CCTV and Every 15 minutes safety checks continues.

## 2017-10-27 NOTE — Unmapped (Signed)
Pt provided to snack

## 2017-10-27 NOTE — Unmapped (Signed)
Patient rounds complete. Pt belongings obtained and put into locked cabinet. Call bells not in room due to safety concerns. 15 minutes checks maintained. Patient is lying in bed and watching TV

## 2017-10-27 NOTE — Unmapped (Signed)
Pt resting on stretcher with eyes closed. Airway patent, breathing even and unlabored, equal b/l chest rise and fall observed. NAD noted. Safety precautions maintained, q20min checks performed by NA. Will continue to monitor pt.

## 2017-10-27 NOTE — Unmapped (Signed)
Patient rounds complete. Pt belongings obtained and put into locked cabinet. Call bells not in room due to safety concerns. 15 minutes checks maintained. Patient is lying in bed

## 2017-10-27 NOTE — Unmapped (Signed)
Report given to  [employer] . Patient care transferred at this time.

## 2017-10-27 NOTE — Unmapped (Signed)
Pt saved under the supervision of this  NA,

## 2017-10-27 NOTE — Unmapped (Signed)
Patient rounds complete. Pt belongings obtained and put into locked cabinet. Call bells not in room due to safety concerns. 15 minutes checks maintained. Patient is sleeping

## 2017-10-27 NOTE — Unmapped (Signed)
Patient rounds  Completed.  Patient is resting in bed with  Eyes closed, call bell is accessible, bed is locked in lowest position. Personal belongings secured per unit protocol. Patient denies any needs at this time.  CCTV and Every 15 minutes safety checks continues.

## 2017-10-27 NOTE — Unmapped (Signed)
Report given to Phil Corti, CNA. Patient care transferred at this time.

## 2017-10-27 NOTE — Unmapped (Signed)
Patient rounds completed. Pt lying in bed watching T.V. Denies any needs at this time.  Respirations regular and unlabored and no signs of distress noted. Bed locked and in lowest position. Personal belongings secured on unit per [facility] protocols.   Psych safety precautions in effect with 15 minute PNA checks and frequent RN monitoring. Will continue to monitor.
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
## 2017-10-27 NOTE — Unmapped (Signed)
Patient rounds complete. Pt belongings obtained and put into locked cabinet. Call bells not in room due to safety concerns. 15 minutes checks maintained.

## 2017-10-27 NOTE — Unmapped (Signed)
Report given to [place], Charity fundraiser. Patient care transferred at this time.

## 2017-10-27 NOTE — Unmapped (Signed)
Patient rounds completed.  Patient is currently sitting up in her room watching TV.  No acute distress noted at this time.  Safety precautions continued.

## 2017-10-27 NOTE — Unmapped (Signed)
Pt speaking with PES and Peer support at this time. Affect appears to be brighter as she is more interactive and less withdrawn

## 2017-10-27 NOTE — Unmapped (Signed)
Rounded on pt. No lines or cords present. Pt items in locker. Bed low and locked, side rails up. Pt denies needs at this time. 15 min checks maintained.   Pt was given dinner at time of rounding.

## 2017-10-27 NOTE — Unmapped (Addendum)
Pt rounds completed,  Peers  Support  At bedside  Talking to pt.  Pt  wants to  Shave  And pt  Setup to do that,  Pt   Calm and cooperative.  Will continue to monitor  Including Q 15 min checks,

## 2017-10-27 NOTE — Unmapped (Signed)
Report received from [place], Charity fundraiser.  Patient rounds completed.  Patient resting quietly in bed and appears to be sleeping.  Safety precautions including q15 minute checks and CCTV observation continued.

## 2017-10-27 NOTE — Unmapped (Signed)
Pt  Rounds completed, the fallowing  Patients  Needs were addressed,   Pt  Complain  Of   His lunch  Tray  States  That is not what he has ordered, pt  Offered   To ordered  Something  of his choice, pt declined  With states  I  Am ok with  No further  Needs at this time,   Pt  Will continue to monitor,

## 2017-10-27 NOTE — Unmapped (Signed)
Pt  Rounds completed, the fallowing  Patients  Needs were addressed,   Pt  Up to the RR and back  To his room,   Breakfast  Tray warm up.   Pt    Is currently siting  At  Bedside  Eating. Pt   Is quiet  And appears   Flat.  Pt  Does no express  Needs and or concerns  At this time, pt will continue  To monitor including  Visual safety Q 15 min checks  By staff.

## 2017-10-27 NOTE — Unmapped (Signed)
Pt  Rounds completed. Pt  laying  in  bed resting  Comfortably  With the eyes closed.  pt Appears to be  Sleep, pt will continue  To monitor safety  And  Q 15 min sheet checks continued visual by Staff,

## 2017-10-27 NOTE — Unmapped (Signed)
ED Progress Note    October 26, 2017     S  42 y.o. adult MTF with a PMH of ADHD, alcoholism, anorexia nervosa, HIV (on Tivicay), migraines, borderline personality disorder, depression, bipolar disorder, seizures, substance abuse who presented to the [facility] ED yesterday with thoughts of SIB.    She was medically cleared, home meds restarted, and evaluated by PES with plan for re-evaluation today, anticipate discharge to substance abuse treatment.    O  Constitutional: Alert and oriented, very anxious appearing  Eyes: Conjunctivae are normal. Pupils equal and reactive.   ENT       Head: Normocephalic and atraumatic.       Nose: No epistaxis.       Mouth/Throat: Mucous membranes are moist.       Neck: No stridor. No nuchal rigidity.  Hematological/Lymphatic/Immunilogical: No cervical lymphadenopathy.  Cardiovascular: Regular rate, regular rhythm. Normal and symmetric distal pulses are present in bilateral upper extremities.  Respiratory: Normal respiratory effort. Breath sounds are clear to auscultation and equal bilaterally.  Gastrointestinal: Soft and nontender. There is no rebound tenderness, guarding or rigidity.  Musculoskeletal: Nontender with normal range of motion in all extremities.   Right lower leg: No tenderness or edema.   Left lower leg: No tenderness or edema.  Neurologic:  No gross focal neurologic deficits are appreciated.  Skin: Skin is warm, dry and intact. No rash noted.  Psychiatric:      A/P  - General medical management- Home meds resumed, due for estradiol injection today, pharmacy to verify home supply. No medical concerns at this time.

## 2017-10-27 NOTE — Unmapped (Signed)
Pt rounds completed,  Pt   Sitting by the nurses station  Talking to staff and filling out  The  Meals menu,   Pt is pleasant,  Calm  And  Cooperative at this time,  Pt  Will continue to monitor  Including  Safety Q 15 min checks,

## 2017-10-28 MED ORDER — BUPRENORPHINE 2 MG-NALOXONE 0.5 MG SUBLINGUAL FILM
0 refills | 0 days
Start: 2017-10-28 — End: 2018-04-28

## 2017-10-28 MED ORDER — BUPRENORPHINE 4 MG-NALOXONE 1 MG SUBLINGUAL FILM
ORAL_FILM | Freq: Every evening | SUBLINGUAL | 0 refills | 0 days | Status: SS
Start: 2017-10-28 — End: 2018-02-23

## 2017-10-28 MED ORDER — BUPRENORPHINE 8 MG-NALOXONE 2 MG SUBLINGUAL FILM: each | 0 refills | 0 days

## 2017-10-28 MED ORDER — BUPRENORPHINE 8 MG-NALOXONE 2 MG SUBLINGUAL FILM: 1 | Film | Freq: Two times a day (BID) | 0 refills | 0 days | Status: AC

## 2017-10-28 MED ORDER — BUPRENORPHINE 8 MG-NALOXONE 2 MG SUBLINGUAL FILM
Freq: Two times a day (BID) | SUBLINGUAL | 0 refills | 0.00000 days | Status: CP
Start: 2017-10-28 — End: 2018-04-28

## 2017-10-28 MED FILL — SUBOXONE/2MG/0.5MG/FILM: SUBOXONE/2MG/0.5MG/FILM | 14 days supply | Qty: 28 | Fill #0

## 2017-10-28 MED FILL — SUBOXONE/8/2MG/FILM: SUBOXONE/8/2MG/FILM | 14 days supply | Qty: 28 | Fill #0

## 2017-10-28 NOTE — Unmapped (Signed)
Patient rounds completed. The following patient needs were addressed: Pt sleeping,RR even and unlabored,side rails up x 2,bed in lowest position,Q 15 min safety visual and sheet checks maintained,will continue to monitor.

## 2017-10-28 NOTE — Unmapped (Addendum)
Pt received breakfast tray 

## 2017-10-28 NOTE — Unmapped (Signed)
Patient rounds completed. The following patient needs were addressed:Pt lying in bed,awake,Q 15 min safety visual and sheet checks maintained,will continue to monitor.

## 2017-10-28 NOTE — Unmapped (Signed)
Report given to  [employer] . Patient care transferred at this time.

## 2017-10-28 NOTE — Unmapped (Signed)
Patient rounds completed. Patient is laying in bed resting with eyes closed, no concerns at this time. Q15 min checks. Belongings labeled and placed in assigned locker. NAD noted.

## 2017-10-28 NOTE — Unmapped (Signed)
Patient rounds completed. The following patient needs were addressed:  Pt lying comfortably in bed awake,Q 15 min safety visual and sheet checks maintained,will continue to monitor.

## 2017-10-28 NOTE — Unmapped (Signed)
Patient rounds  Completed. Patient is awake, call bell is accessible, bed is locked in lowest position. Personal belongings secured per unit protocol. Patient denies any needs at this time.CCTV and Every 15 minutes safety checks continues.

## 2017-10-28 NOTE — Unmapped (Signed)
Patient rounds  Completed. Patient is resting in bed with eyes closed, call bell is accessible, bed is locked in lowest position. Personal belongings secured per unit protocol. Patient denies any needs at this time.CCTV and Every 15 minutes safety checks continues.

## 2017-10-28 NOTE — Unmapped (Signed)
Patient rounds completed. Patient is standing at nurses' station talking on phone, no concerns at this time. Q15 min checks. Belongings labeled and placed in assigned locker. NAD noted.

## 2017-10-28 NOTE — Unmapped (Signed)
Patient rounds completed. Pt lying in bed watching T.V. Denies any needs at this time.  Respirations regular and unlabored and no signs of distress noted. Bed locked and in lowest position. Personal belongings secured on unit per [facility] protocols.   Psych safety precautions in effect with 15 minute PNA checks and frequent RN monitoring. Will continue to monitor.

## 2017-10-28 NOTE — Unmapped (Signed)
Patient rounds completed. Pt awake and ambulated to the restroom. Gait noted to be steady. Pt appears to be in a pleasant mood this a.m. As she is laughing and interacting with staff appropriately. Pt excited about discharge. Tolerated morning meds well.  RN provided emotional support. Pt does report that she slept well last night. She denies any needs at this time. Respirations regular and unlabored and no signs of distress noted. Pt now sitting upright in bed and eating breakfast while watching T.V. Bed is locked and in lowest position as it is a psych safe bed. Psych safety precautions in effect with 15 minute PNA checks and frequent RN monitoring. Will continue to monitor.

## 2017-10-28 NOTE — Unmapped (Signed)
Pt  Rounds completed, the fallowing  Patients  Needs were addressed,   Pt provide to meal tray, pt sat  At bedside  Table  Ate  100% of  Her  Diet. Pt  Calm  Pleasant laughing  With  Staff, pt will continue to monitor. Pt spoke  To  NP shelly  On the  Phone.

## 2017-10-28 NOTE — Unmapped (Signed)
Report received from [place], Charity fundraiser.  Patient rounds completed.  Patient resting quietly in bed and appears to be sleeping.  Safety precautions including q15 minute checks and CCTV observation continued.

## 2017-10-28 NOTE — Unmapped (Signed)
Patient rounds completed. Patient is sitting in room watching tv, no concerns at this time. Q15 min checks. Belongings labeled and placed in assigned locker. NAD noted.

## 2017-10-28 NOTE — Unmapped (Signed)
Patient rounds completed. Patient is lying in bed watching tv quietly, no concerns at this time. Q15 min checks. Belongings labeled and placed in assigned locker. NAD noted.

## 2017-10-28 NOTE — Unmapped (Signed)
Pt received lunch tray 

## 2017-10-28 NOTE — Unmapped (Signed)
Patient rounds completed. The following patient needs were addressed:  Pt resting comfortably in bed watching TV,deniel no need at this time,Q 15 min safety visual and sheet checks maintained,will continue to monitor.

## 2017-10-28 NOTE — Unmapped (Signed)
Patient rounds completed. Patient is sitting in bed watching tv and speaking with pharmacist, no concerns at this time. Q15 min checks. Belongings labeled and placed in assigned locker. NAD noted.

## 2017-10-28 NOTE — Unmapped (Signed)
Patient rounds completed. The following patient needs were addressed:

## 2017-10-28 NOTE — Unmapped (Signed)
Report given to [facility]. Patient care transferred at this time

## 2017-10-28 NOTE — Unmapped (Signed)
Patient rounds  Completed. Patient is  Resting in bed with eyes closed, call bell is accessible, bed is locked in lowest position. Personal belongings secured per unit protocol. Patient denies any needs at this time.CCTV and Every 15 minutes safety checks continues.
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
## 2017-10-28 NOTE — Unmapped (Signed)
Pt provided to meal tray,

## 2017-10-29 NOTE — Unmapped (Signed)
Discharge instructions reviewed with pt. Pt verbalized understanding of discharge teaching. Pt denies si/hi/ah/vh and pain at time of discharge. Personal belongings returned to pt and confirmed by pt that all items were present. Pt calm and cooperative at time of discharge. Driver arrived from [employer]  arrived to transport pt home. Pt has home meds

## 2017-11-09 NOTE — Unmapped (Signed)
.  [facility] Refill Coordination Note  Medication: DESCOVY  TIVICAY    Unable to reach patient to schedule shipment for medication being filled at [facility]. Left voicemail on phone.  As this is the 3rd unsuccessful attempt to reach the patient, no additional phone call attempts will be made at this time.      Phone numbers attempted: [phone]  Last scheduled delivery: SHIPPED 10/06/17    Please call the [facility] at [phone] (option 4) should you have any further questions.      Thanks,  [facility] Specialty Team

## 2017-11-10 MED ORDER — DOLUTEGRAVIR 50 MG TABLET: 50 mg | tablet | Freq: Every day | 0 refills | 0 days | Status: AC

## 2017-11-10 MED ORDER — EMTRICITABINE 200 MG-TENOFOVIR ALAFENAMIDE FUMARATE 25 MG TABLET: 1 | tablet | Freq: Every day | 0 refills | 0 days | Status: AC

## 2017-11-10 MED ORDER — EMTRICITABINE 200 MG-TENOFOVIR ALAFENAMIDE FUMARATE 25 MG TABLET
ORAL_TABLET | Freq: Every day | ORAL | 0 refills | 0.00000 days | Status: CP
Start: 2017-11-10 — End: 2017-11-10

## 2017-11-10 MED ORDER — EMTRICITABINE 200 MG-TENOFOVIR ALAFENAMIDE FUMARATE 25 MG TABLET: 1 | tablet | 0 refills | 0 days

## 2017-11-10 MED ORDER — DOLUTEGRAVIR 50 MG TABLET: 50 mg | tablet | 0 refills | 0 days

## 2017-11-10 MED ORDER — DOLUTEGRAVIR 50 MG TABLET
ORAL_TABLET | Freq: Every day | ORAL | 0 refills | 0.00000 days | Status: CP
Start: 2017-11-10 — End: 2017-11-10

## 2017-11-10 MED ORDER — DOLUTEGRAVIR 50 MG TABLET: 50 mg | each | 0 refills | 0 days

## 2017-11-10 NOTE — Unmapped (Signed)
Pt never called back after VM left.  Now noted that the patient is in substance abuse rehab. Unknown when she will be released. SSC received call for medication refill.     Time Spent During Encounter: 5 minutes    Tonie Griffith, PharmD, BCPS, AAHIVP, CPP  Infectious Disease Clinical Pharmacy Practitioner   [facility]   Direct line: [phone]

## 2017-11-10 NOTE — Unmapped (Signed)
Patient currently in rehab;  She will call us when she is released.   Carley Hammed, RN

## 2017-11-10 NOTE — Unmapped (Signed)
[facility] Refill Coordination Note  Specialty Medication(s): Tivicay and [id number]  Additional Medications shipped: wellbutrin, hctz, tamsulosin    Ryan Robinson, DOB: 09-04-1975  Phone: [phone] (home) , Alternate phone contact: N/A  Phone or address changes today?: No  All above HIPAA information was verified with patient's caregiver.  Shipping Address: [address] [name] [address]   Insurance changes? No    Completed refill call assessment today to schedule patient's medication shipment from the [facility] [phone]).      Confirmed the medication and dosage are correct and have not changed: Yes, regimen is correct and unchanged.    Confirmed patient started or stopped the following medications in the past month:  No, there are no changes reported at this time.    Are you tolerating your medication?:  [name] reports tolerating the medication.    ADHERENCE    Has 2 days of medication left    Did you miss any doses in the past 4 weeks? No missed doses reported.    FINANCIAL/SHIPPING    Delivery Scheduled: Yes, Expected medication delivery date: 11/12/17 sending to rehab facility [address] [place] [phone]     The patient will receive an FSI print out for each medication shipped and additional FDA Medication Guides as required.  Patient education from [place] or [name] may also be included in the shipment    [name] did not have any additional questions at this time.    Delivery address validated in FSI scheduling system: [address] [place] [phone]    We will follow up with patient monthly for standard refill processing and delivery.      Thank you,  [name]   [facility] Specialty Pharmacist

## 2017-11-11 MED FILL — DESCOVY/200MG/25MG/TABS: DESCOVY/200MG/25MG/TABS | 30 days supply | Qty: 30 | Fill #0

## 2017-11-11 MED FILL — TAMSULOSIN/0.4MG/CAP: TAMSULOSIN/0.4MG/CAP | 30 days supply | Qty: 30 | Fill #7

## 2017-11-11 MED FILL — TIVICAY/50MG/TAB: TIVICAY/50MG/TAB | 30 days supply | Qty: 30 | Fill #0

## 2017-11-11 MED FILL — HYDROCHLOROTHIAZIDE/25MG/TABS: HYDROCHLOROTHIAZIDE/25MG/TABS | 30 days supply | Qty: 30 | Fill #7

## 2017-11-11 MED FILL — BUPROPION HCL XL/150MG/TB24: BUPROPION HCL XL/150MG/TB24 | 30 days supply | Qty: 90 | Fill #7

## 2017-11-30 NOTE — Unmapped (Signed)
Duration of Intervention: 5 minutes    SW attempted to call pt to check in but no answer, left voicemail.     Bradly Bienenstock LCSWA, CHES

## 2017-12-01 DIAGNOSIS — R112 Nausea with vomiting, unspecified: Principal | ICD-10-CM

## 2017-12-01 LAB — COMPREHENSIVE METABOLIC PANEL
ALBUMIN: 4.2 g/dL (ref 3.5–5.0)
ALKALINE PHOSPHATASE: 48 U/L (ref 38–126)
ALT (SGPT): 22 U/L (ref 13–69)
ANION GAP: 9 mmol/L (ref 9–15)
AST (SGOT): 26 U/L (ref 17–47)
BLOOD UREA NITROGEN: 16 mg/dL (ref 7–21)
BUN / CREAT RATIO: 20
CALCIUM: 9.7 mg/dL (ref 8.5–10.2)
CHLORIDE: 102 mmol/L (ref 98–107)
CO2: 26 mmol/L (ref 22.0–30.0)
CREATININE: 0.8 mg/dL (ref 0.65–1.15)
EGFR CKD-EPI AA FEMALE: >90 mL/min/{1.73_m2} (ref >=60)
EGFR CKD-EPI AA MALE: >90 mL/min/{1.73_m2} (ref >=60)
EGFR CKD-EPI NON-AA FEMALE: >90 mL/min/{1.73_m2} (ref >=60)
POTASSIUM: 3.6 mmol/L (ref 3.5–5.0)
PROTEIN TOTAL: 7.1 g/dL (ref 6.5–8.3)
SODIUM: 137 mmol/L (ref 135–145)

## 2017-12-01 LAB — CBC W/ AUTO DIFF
BASOPHILS ABSOLUTE COUNT: 0.1 10*9/L (ref 0.0–0.1)
BASOPHILS RELATIVE PERCENT: 0.4 %
EOSINOPHILS ABSOLUTE COUNT: 0.2 10*9/L (ref 0.0–0.4)
EOSINOPHILS RELATIVE PERCENT: 1.2 %
HEMATOCRIT: 40.9 % — ABNORMAL LOW (ref 41.0–46.0)
HEMOGLOBIN: 13.6 g/dL (ref 13.5–16.0)
LARGE UNSTAINED CELLS: 2 % (ref 0–4)
LYMPHOCYTES ABSOLUTE COUNT: 3.2 10*9/L (ref 1.5–5.0)
LYMPHOCYTES RELATIVE PERCENT: 22.9 %
MEAN CORPUSCULAR HEMOGLOBIN CONC: 33.4 g/dL (ref 31.0–37.0)
MEAN CORPUSCULAR HEMOGLOBIN: 32.2 pg (ref 26.0–34.0)
MEAN PLATELET VOLUME: 7.9 fL (ref 7.0–10.0)
MONOCYTES RELATIVE PERCENT: 4.8 %
NEUTROPHILS ABSOLUTE COUNT: 9.7 10*9/L — ABNORMAL HIGH (ref 2.0–7.5)
NEUTROPHILS RELATIVE PERCENT: 68.9 %
PLATELET COUNT: 243 10*9/L (ref 150–440)
RED BLOOD CELL COUNT: 4.24 10*12/L — ABNORMAL LOW (ref 4.50–5.20)
RED CELL DISTRIBUTION WIDTH: 13.3 % (ref 12.0–15.0)
WBC ADJUSTED: 14.2 10*9/L — ABNORMAL HIGH (ref 4.5–11.0)

## 2017-12-01 LAB — ETHANOL: Ethanol:MCnc:Pt:Ser/Plas:Qn:GC: <10

## 2017-12-01 LAB — MAGNESIUM: Magnesium:MCnc:Pt:Ser/Plas:Qn:: 1.7

## 2017-12-01 LAB — LARGE UNSTAINED CELLS: Lab: 2

## 2017-12-01 LAB — LIPASE: Triacylglycerol lipase:CCnc:Pt:Ser/Plas:Qn:: 22 — ABNORMAL LOW

## 2017-12-01 LAB — EGFR CKD-EPI NON-AA FEMALE: Lab: >90

## 2017-12-01 NOTE — Unmapped (Signed)
Duration of Intervention: 15 minutes    Pt called SW to f/u. Pt back in [place] area but still cannot move into an apartment due to things being wrong with it. Pt called [facility] for assistance with hotel bill. [facility] and there is no space. SW advised pt to call 211 to inquire about any other emergency housing for the night. Pt was coughing on the phone so SW asked if she would like to come to urgent care clinic tomorrow morning. Pt stated she doesn't know where she'll be but will think about it. Pt advised about walk in hours again. SW asked pt to check in with her soon to keep SW updated. Pt expressed no other immediate concerns for SW intervention at this time.      Bradly Bienenstock LCSWA, CHES

## 2017-12-02 ENCOUNTER — Ambulatory Visit: Admit: 2017-12-02 | Discharge: 2017-12-03 | Disposition: A | Discharge status: Home / Self Care | Payer: MEDICARE

## 2017-12-02 DIAGNOSIS — R112 Nausea with vomiting, unspecified: Principal | ICD-10-CM

## 2017-12-02 LAB — BILIRUBIN UA: Lab: NEGATIVE

## 2017-12-02 LAB — TOXICOLOGY SCREEN, URINE
BARBITURATE SCREEN URINE: <200
CANNABINOID SCREEN URINE: <20
METHADONE SCREEN, URINE: <300
OPIATE SCREEN URINE: <300

## 2017-12-02 LAB — BASIC METABOLIC PANEL
ANION GAP: 5 mmol/L — ABNORMAL LOW (ref 9–15)
BLOOD UREA NITROGEN: 14 mg/dL (ref 7–21)
BUN / CREAT RATIO: 20
CHLORIDE: 105 mmol/L (ref 98–107)
CO2: 26 mmol/L (ref 22.0–30.0)
CREATININE: 0.71 mg/dL (ref 0.65–1.15)
EGFR CKD-EPI AA FEMALE: >90 mL/min/{1.73_m2} (ref >=60)
EGFR CKD-EPI AA MALE: >90 mL/min/{1.73_m2} (ref >=60)
GLUCOSE RANDOM: 85 mg/dL (ref 65–179)
POTASSIUM: 3.6 mmol/L (ref 3.5–5.0)
SODIUM: 136 mmol/L (ref 135–145)

## 2017-12-02 LAB — URINALYSIS WITH CULTURE REFLEX
BILIRUBIN UA: NEGATIVE
KETONES UA: NEGATIVE
NITRITE UA: NEGATIVE
RBC UA: 11 /HPF — ABNORMAL HIGH (ref <=3)
SPECIFIC GRAVITY UA: 1.027 (ref 1.003–1.030)
SQUAMOUS EPITHELIAL: 2 /HPF (ref 0–5)
UROBILINOGEN UA: 2 — AB
WBC UA: 124 /HPF — ABNORMAL HIGH (ref <=2)

## 2017-12-02 LAB — CBC W/ AUTO DIFF
BASOPHILS ABSOLUTE COUNT: 0 10*9/L (ref 0.0–0.1)
BASOPHILS RELATIVE PERCENT: 0.5 %
EOSINOPHILS ABSOLUTE COUNT: 0.1 10*9/L (ref 0.0–0.4)
EOSINOPHILS RELATIVE PERCENT: 1.3 %
HEMOGLOBIN: 12.5 g/dL — ABNORMAL LOW (ref 13.5–16.0)
LARGE UNSTAINED CELLS: 1 % (ref 0–4)
LYMPHOCYTES ABSOLUTE COUNT: 2.3 10*9/L (ref 1.5–5.0)
LYMPHOCYTES RELATIVE PERCENT: 29.3 %
MEAN CORPUSCULAR HEMOGLOBIN CONC: 33.4 g/dL (ref 31.0–37.0)
MEAN CORPUSCULAR HEMOGLOBIN: 32.4 pg (ref 26.0–34.0)
MEAN CORPUSCULAR VOLUME: 97 fL (ref 80.0–100.0)
MONOCYTES ABSOLUTE COUNT: 0.5 10*9/L (ref 0.2–0.8)
MONOCYTES RELATIVE PERCENT: 6.5 %
NEUTROPHILS ABSOLUTE COUNT: 4.8 10*9/L (ref 2.0–7.5)
NEUTROPHILS RELATIVE PERCENT: 61 %
PLATELET COUNT: 182 10*9/L (ref 150–440)
RED BLOOD CELL COUNT: 3.86 10*12/L — ABNORMAL LOW (ref 4.50–5.20)
WBC ADJUSTED: 7.9 10*9/L (ref 4.5–11.0)

## 2017-12-02 LAB — LACTATE BLOOD VENOUS: Lactate:SCnc:Pt:BldV:Qn:: 0.7

## 2017-12-02 LAB — EGFR CKD-EPI NON-AA FEMALE: Lab: >90

## 2017-12-02 LAB — C-REACTIVE PROTEIN: C reactive protein:MCnc:Pt:Ser/Plas:Qn:: 15.3 — ABNORMAL HIGH

## 2017-12-02 LAB — BASOPHILS ABSOLUTE COUNT: Lab: 0

## 2017-12-02 LAB — COCAINE(METAB.)SCREEN, URINE

## 2017-12-02 NOTE — Unmapped (Signed)
Pt given urine specimen cup and educated on how to collect sample. Pt verbalized understanding and will give urine sample at earliest time possible.

## 2017-12-02 NOTE — Unmapped (Signed)
Care Management  Initial Transition Planning Assessment              General  Care Manager assessed the patient by : In person interview with patient  Orientation Level: Oriented X4  Who provides care at home?: (self care)    Contact/Decision Maker       Extended Emergency Contact Information  Primary Emergency Contact: Singleton,Ronald   [place]  Mobile Phone: [phone]  Relation: Friend    Legal Next of Kin / Guardian / POA / Advance Directives      Advance Directive (Medical Treatment)  Does patient have an advance directive covering medical treatment?: Patient does not have advance directive covering medical treatment., Patient would like information.  Reason patient does not have an advance directive covering medical treatment:: Patient does not wish to complete one at this time  Reason there is not a Health Care Decision Maker appointed:: Patient does not wish to appoint a Health Care Decision Maker at this time  Information provided on advance directive:: Yes  Patient requests assistance:: Yes, referral made to social services    Advance Directive (Mental Health Treatment)  Does patient have an advance directive covering mental health treatment?: Patient would not like information., Patient does not have advance directive covering mental health treatment.  Reason patient does not have an advance directive covering mental health treatment:: Patient does not wish to complete one at this time.    Patient Information  Lives with: Spouse/significant other    Type of Residence: Private residence     [address] [place] [address]     Location/Detail: [phone]    Support Systems: Spouse    Responsibilities/Dependents at home?: No    Home Care services in place prior to admission?: No                       Equipment Currently Used at Home: none       Currently receiving outpatient dialysis?: No       Financial Information       Need for financial assistance?: No Social Determinants of Health  Social Determinants of Health were addressed in provider documentation.  Please refer to patient history.    Discharge Needs Assessment  Concerns to be Addressed: substance/tobacco abuse/use    Clinical Risk Factors:           Medical Provider(s): Eloisa Northern, MD  Reason for Admission: Admitting Diagnosis:  HIV (human immunodeficiency virus infection) (CMS-HCC) [B20]  Polysubstance abuse (CMS-HCC) [F19.10]  Acute cystitis without hematuria [N30.00]  Fatigue, unspecified type [R53.83]  Intractable vomiting with nausea, unspecified vomiting type [R11.2]  Past Medical History:   has a past medical history of ADHD (attention deficit hyperactivity disorder), Alcoholism (CMS-HCC), Anorexia nervosa, Anxiety, Bipolar disorder (CMS-HCC), Depression, Depressive disorder, HIV (human immunodeficiency virus infection) (CMS-HCC), Migraines, Panic disorder, R/O Borderline personality disorder (12/02/2015), Seizures (CMS-HCC), Substance abuse (CMS-HCC), and Substance Use Treatment History.  Past Surgical History:   has a past surgical history that includes Hip Arthroplasty and Joint replacement (2011).   Previous admit date: 07/17/2016    Primary Insurance- Payor: MEDICARE / Plan: MEDICARE PART A AND PART B / Product Type: *No Product type* /   Secondary Insurance ??? Secondary Insurance  MEDICAID [place]    Preferred Pharmacy - [facility] - [place], [place] - [address]  [facility] - [place], [place] - [phone]  [facility] [place] - [address]    Transportation home: Taxi  Level of function prior to admission: Independent    Barriers to taking medications: No    Prior overnight hospital stay or ED visit in last 90 days: Yes(10/2017 ED admitting)    Readmission Within the Last 30 Days: no previous admission in last 30 days        Anticipated Changes Related to Illness: none    Equipment Needed After Discharge: none Discharge Facility/Level of Care Needs:      Readmission  Risk of Unplanned Readmission Score:  %  Readmitted Within the Last 30 Days?   Patient at risk for readmission?: No    Discharge Plan  Screen findings are: Discharge planning needs identified or anticipated (Comment).(SW consult placed or Substance abuse resources )    Expected Discharge Date: 12/03/17    Expected Transfer from Critical Care:      Patient and/or family were provided with choice of facilities / services that are available and appropriate to meet post hospital care needs?: Yes       Initial Assessment complete?: Yes

## 2017-12-02 NOTE — Unmapped (Signed)
Patient transported to X-ray  Transported by Radiology  How tranported Wheelchair  Cardiac Monitor no

## 2017-12-02 NOTE — Unmapped (Signed)
Problem: Adult Inpatient Plan of Care  Goal: Plan of Care Review  Outcome: Progressing  Pt Aox4. Receiving from the ED. C/o abdominal tenderness with light palpation. Voiding without difficulty. IVF started.  Flowsheets (Taken 12/02/2017 0551)  Progress: no change  Plan of Care Reviewed With: patient  Goal: Patient-Specific Goal (Individualization)  Outcome: Progressing  Goal: Absence of Hospital-Acquired Illness or Injury  Outcome: Progressing  Goal: Optimal Comfort and Wellbeing  Outcome: Progressing  Goal: Readiness for Transition of Care  Outcome: Progressing  Goal: Rounds/Family Conference  Outcome: Progressing     Problem: HIV/AIDS Infection  Goal: HIV/AIDS Symptom Control  Outcome: Progressing     Problem: Opioid Dependence or Withdrawal  Goal: Withdrawal Symptom Control  Outcome: Progressing

## 2017-12-02 NOTE — Unmapped (Signed)
Patient rounding complete, call bell in reach, bed locked and in lowest position, side rails up x2, patient belongings at bedside and within reach of patient.  Pt resting quietly on stretcher with eyes closed and appears to be sleeping. Pt in NAD.

## 2017-12-02 NOTE — Unmapped (Signed)
Pt here with c/o of generalized weakness, fatigue, blurry vision, sweating, nausea, and dizziness. Reports began yesterday

## 2017-12-02 NOTE — Unmapped (Signed)
Pt endorses lightheadedness during orthostatic vitals.

## 2017-12-02 NOTE — Unmapped (Signed)
Patient rounding complete, call bell in reach, bed locked and in lowest position, side rails up x2, patient belongings at bedside and within reach of patient.  Patient updated on plan of care. Pt resting quietly on stretcher and in NAD.

## 2017-12-02 NOTE — Unmapped (Signed)
Pt VSS, pt free from falls. Complaints of intermittent abd  pain, please refer to [facility] for interventions. All meds administered w/o difficulty. Pt afebrile this shift. Pt to go down for a CT of the abd, oral contrast has been administered and finished, CT has been called and they have gotten pt travel information. No other significant events to report at this time. Will continue to monitor.     Problem: Adult Inpatient Plan of Care  Goal: Plan of Care Review  Outcome: Progressing  Goal: Patient-Specific Goal (Individualization)  Outcome: Progressing  Goal: Absence of Hospital-Acquired Illness or Injury  Outcome: Progressing  Goal: Optimal Comfort and Wellbeing  Outcome: Progressing  Goal: Readiness for Transition of Care  Outcome: Progressing  Goal: Rounds/Family Conference  Outcome: Progressing     Problem: HIV/AIDS Infection  Goal: HIV/AIDS Symptom Control  Outcome: Progressing     Problem: Opioid Dependence or Withdrawal  Goal: Withdrawal Symptom Control  Outcome: Progressing     Problem: Pain Chronic (Persistent) (Comorbidity Management)  Goal: Acceptable Pain Control and Functional Ability  Outcome: Progressing
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
## 2017-12-02 NOTE — Unmapped (Signed)
FAM med at bedside.

## 2017-12-02 NOTE — Unmapped (Signed)
[facility] Emergency Department Provider Note      ED CLINICAL IMPRESSION:     Final diagnoses:   Fatigue, unspecified type   Acute cystitis without hematuria (Primary)   HIV (human immunodeficiency virus infection) (CMS-HCC)   Intractable vomiting with nausea, unspecified vomiting type   Polysubstance abuse (CMS-HCC)       ASSESSMENT:     IMPRESSION & PLAN: This is a 42 y.o. adult with a history of HIV and polysubstance use who presents w/ weakness. Triage vital signs are unremarkable. Exam reveals right-sided CVA tenderness and diffuse abdominal tenderness.  Patient could have UTI/pyelo that would explain her variety of symptoms.  Other sources of infection are less likely as pt is afebrile and denies other respiratory symptoms.  Pancreatitis is considered due to complaint of epigastric pain and recent alcohol use but is less likely as pt does not appear severely ill.  We will check labs and urine to assess for infection or other etiology of pt's presentation.      ED COURSE:    2100: Pt evaluated at bedside.        Pt signed out to PA pending labs and response to treatment w/ po trial  Hds, nad, resting comfortably at that time    I discussed the findings of the medical work-up, my rationale behind the medical decision making process and my treatment plan with the patient. The patient understands and is comfortable with the plan of care. All questions were answered.     ____________________________________________    I have reviewed the triage vital signs and the nursing notes.  I have discussed the case with the ED Attending, Dr. Michaell Cowing, MD      MEDICAL HISTORY:      TIME SEEN:  December 01, 2017 9:56 PM    CHIEF COMPLAINT: Weakness and Dizziness      HISTORY OF PRESENT ILLNESS:   Ryan Robinson is a 42 y.o. adult who presents to the [facility] ED today complaining of weakness.  The patient reports that over the last 24 hours she has been feeling like she has flu-like symptoms that include weakness, lightheadedness, blurry vision, nausea, dry heaving, abdominal pain, low appetite, headache, and sweaty.  She reports never having these before.  She does note that she drank alcohol for the first time in a month yesterday evening when symptoms began.  She endorses having one episode of bright-red-blood on toilet paper after wiping this morning but this is the first time she has ever noticed it; she reports a history of hemorrhoids.      Additional History: Not applicable    History and Review of Systems Limited By: Nothing    REVIEW OF SYSTEMS: Negative except as mentioned in HPI    PAST MEDICAL HISTORY:  Past Medical History:   Diagnosis Date   ??? ADHD (attention deficit hyperactivity disorder)    ??? Alcoholism (CMS-HCC)    ??? Anorexia nervosa    ??? Anxiety    ??? Bipolar disorder (CMS-HCC)    ??? Depression    ??? Depressive disorder    ??? HIV (human immunodeficiency virus infection) (CMS-HCC)    ??? Migraines    ??? Panic disorder    ??? R/O Borderline personality disorder 12/02/2015   ??? Seizures (CMS-HCC)    ??? Substance abuse (CMS-HCC)    ??? Substance Use Treatment History        SURGICAL HISTORY:  has a past surgical history that includes Hip Arthroplasty  and Joint replacement (2011).    OUTPATIENT MEDICATIONS:  Prior to Admission medications    Medication Sig Start Date End Date Taking? Authorizing Provider   albuterol (PROVENTIL HFA;VENTOLIN HFA) 90 mcg/actuation inhaler Inhale 2 puffs every four (4) hours as needed for wheezing. 11/05/15   Estill Dooms, MD   buprenorphine-naloxone (SUBOXONE) 4-1 mg Film sublingual film Place 1 Film (4 mg total) under the tongue every evening. for 14 days 10/28/17 11/11/17  Liliane Bade, MD   buprenorphine-naloxone (SUBOXONE) 8-2 mg sublingual film Place 1 Film (8 mg total) under the tongue Two (2) times a day. for 14 days 10/28/17 11/11/17  Liliane Bade, MD   buPROPion (WELLBUTRIN XL) 150 MG 24 hr tablet Take 3 tablets (450 mg total) by mouth daily. for 14 days 10/27/17 11/10/17  Ouida Sills, PMHNP   dolutegravir (TIVICAY) 50 mg Tab TABLET Take 1 tablet (50 mg total) by mouth daily. 11/10/17 12/10/17  Tamala Bari, MD   emtricitabine-tenofovir alafen (DESCOVY) 200-25 mg tablet Take 1 tablet by mouth daily. 11/10/17 12/10/17  Tamala Bari, MD   estradiol valerate (DELESTROGEN) 10 mg/mL Oil injection Inject 10 mg into the muscle every seven (7) days.    Historical Provider, MD   hydroCHLOROthiazide (HYDRODIURIL) 25 MG tablet Take 1 tablet (25 mg total) by mouth every morning. for 14 days 10/27/17 11/10/17  Ouida Sills, PMHNP   medroxyPROGESTERone (PROVERA) 10 MG tablet Take 1 tablet (10 mg total) by mouth daily. for 14 days 10/27/17 11/10/17  Ouida Sills, PMHNP   melatonin 3 mg Tab Take 2 tablets (6 mg total) by mouth nightly as needed (sleep). for up to 28 doses 10/27/17   Ouida Sills, PMHNP   nicotine (NICODERM CQ) 21 mg/24 hr patch Place 1 patch on the skin. 10/05/14   Historical Provider, MD   spironolactone (ALDACTONE) 100 MG tablet Take 1 tablet (100 mg total) by mouth Two (2) times a day. for 14 days 10/27/17 11/10/17  Ouida Sills, PMHNP   tamsulosin [facility]) 0.4 mg capsule Take 1 capsule (0.4 mg total) by mouth daily. for 14 days 10/27/17 11/10/17  Ouida Sills, PMHNP       ALLERGIES: is allergic to abilify [aripiprazole]; amoxicillin; penicillins; toradol [ketorolac]; and ultram [tramadol].    FAMILY HISTORY: family history includes ADD / ADHD in her mother; Anxiety disorder in her father, mother, and sister; Bipolar disorder in her cousin, maternal aunt, mother, and sister; Depression in her cousin, maternal aunt, mother, and sister; Drug abuse in her brother, cousin, and mother; Paranoid behavior in her sister; Seizures in her sister.    SOCIAL HISTORY:  Tobacco use:  reports that she has been smoking.  She has a 5.00 pack-year smoking history. She has never used smokeless tobacco.   Alcohol use:  reports that she drinks alcohol.  Drug use:  reports that she has current or past drug history. Drug: Crack cocaine.    PHYSICAL EXAM:     ED Vital Signs:  Vitals:    12/02/17 0238 12/02/17 0435 12/02/17 0454 12/02/17 0735   BP: 105/63 102/70 107/72 105/63   Pulse: 68 64 67 66   Resp: 16 16 18 17    Temp: 36.7 ??C (98 ??F)  36.8 ??C (98.2 ??F) 36.4 ??C (97.5 ??F)   TempSrc: Oral  Oral Oral   SpO2: 97% 98% 100% 97%   Weight:   90.8 kg (200 lb 2.8 oz)    Height:   175.3  cm (5' 9)        Constitutional: Alert and oriented. Well appearing and in no distress.  Eyes: Conjunctivae are normal.  ENT       Head: Normocephalic and atraumatic.       Nose: No epistaxis or rhinorrhea.       Mouth/Throat: MMM, no oral lesions noted  Cardiovascular: RRR, no m/r/g, radial pulses 2+ bilaterally  Respiratory: Normal WOB on room air, lungs CTAB  Gastrointestinal: Normal bowel sounds, soft, slightly tender to palpation diffusely but most notably in epigastric area.  No rebound  Genitourinary: R-sided CVA tenderness.  Musculoskeletal: Nontender with normal range of motion in all extremities and no lower extremity edema  Neurologic: Normal speech and language. No gross focal neurologic deficits are appreciated.  Skin: Skin is warm, dry and intact. No rash noted.  Psychiatric: Mood and affect are normal. Speech and behavior are normal.      LABORATORY DATA:     Results for orders placed or performed during the hospital encounter of 12/01/17   Comprehensive metabolic panel   Result Value Ref Range    Sodium 137 135 - 145 mmol/L    Potassium 3.6 3.5 - 5.0 mmol/L    Chloride 102 98 - 107 mmol/L    CO2 26.0 22.0 - 30.0 mmol/L    Anion Gap 9 9 - 15 mmol/L    BUN 16 7 - 21 mg/dL    Creatinine 8.11 9.14 - 1.15 mg/dL    BUN/Creatinine Ratio 20     EGFR CKD-EPI Non-African American, Male >90 >=60 mL/min/1.7m2    EGFR CKD-EPI Non-African American, Male >90 >=60 mL/min/1.36m2    EGFR CKD-EPI African American, Male >90 >=60 mL/min/1.34m2    EGFR CKD-EPI African American, Male >90 >=60 mL/min/1.73m2    Glucose 91 65 - 179 mg/dL    Calcium 9.7 8.5 - 78.2 mg/dL    Albumin 4.2 3.5 - 5.0 g/dL    Total Protein 7.1 6.5 - 8.3 g/dL    Total Bilirubin 0.4 0.0 - 1.2 mg/dL    AST 26 17 - 47 U/L    ALT 22 13 - 69 U/L    Alkaline Phosphatase 48 38 - 126 U/L   Urinalysis with Culture Reflex   Result Value Ref Range    Color, UA Yellow     Clarity, UA Hazy     Specific Gravity, UA 1.027 1.003 - 1.030    pH, UA 5.5 5.0 - 9.0    Leukocyte Esterase, UA Large (A) Negative    Nitrite, UA Negative Negative    Protein, UA Trace (A) Negative    Glucose, UA Negative Negative    Ketones, UA Negative Negative    Urobilinogen, UA 2.0 mg/dL (A) 0.2 mg/dL, 1.0 mg/dL    Bilirubin, UA Negative Negative    Blood, UA Negative Negative    RBC, UA 11 (H) <=3 /HPF    WBC, UA 124 (H) <=2 /HPF    Squam Epithel, UA 2 0 - 5 /HPF    Bacteria, UA Occasional (A) None Seen /HPF    Mucus, UA Rare (A) None Seen /HPF   Lipase   Result Value Ref Range    Lipase 22 (L) 44 - 232 U/L   Magnesium Level   Result Value Ref Range    Magnesium 1.7 1.6 - 2.2 mg/dL   Toxicology screen, urine   Result Value Ref Range    Amphetamine Screen, Ur =/>500 ng/mL (A) Not Applicable  Barbiturate Screen, Ur <200 ng/mL Not Applicable    Benzodiazepine Screen, Urine <200 ng/mL Not Applicable    Cannabinoid Scrn, Ur <20 ng/mL Not Applicable    Methadone Screen, Urine <300 ng/mL Not Applicable    Cocaine(Metab.)Screen, Urine =/>150 ng/mL (A) Not Applicable    Opiate Scrn, Ur <300 ng/mL Not Applicable   Ethanol,Blood   Result Value Ref Range    Alcohol, Ethyl <10.0 Undefined mg/dL   Lactate, Venous, Whole Blood   Result Value Ref Range    Lactate, Venous 0.7 0.5 - 1.8 mmol/L   Basic Metabolic Panel   Result Value Ref Range    Sodium 136 135 - 145 mmol/L    Potassium 3.6 3.5 - 5.0 mmol/L    Chloride 105 98 - 107 mmol/L    CO2 26.0 22.0 - 30.0 mmol/L    Anion Gap 5 (L) 9 - 15 mmol/L    BUN 14 7 - 21 mg/dL    Creatinine 1.61 0.96 - 1.15 mg/dL BUN/Creatinine Ratio 20     EGFR CKD-EPI Non-African American, Male >90 >=60 mL/min/1.81m2    EGFR CKD-EPI Non-African American, Male >90 >=60 mL/min/1.29m2    EGFR CKD-EPI African American, Male >90 >=60 mL/min/1.85m2    EGFR CKD-EPI African American, Male >90 >=60 mL/min/1.52m2    Glucose 85 65 - 179 mg/dL    Calcium 8.6 8.5 - 04.5 mg/dL   ECG 12 Lead   Result Value Ref Range    EKG Systolic BP  mmHg    EKG Diastolic BP  mmHg    EKG Ventricular Rate 90 BPM    EKG Atrial Rate 90 BPM    EKG P-R Interval 156 ms    EKG QRS Duration 86 ms    EKG Q-T Interval 368 ms    EKG QTC Calculation 450 ms    EKG Calculated P Axis 58 degrees    EKG Calculated R Axis 32 degrees    EKG Calculated T Axis 49 degrees    QTC Fredericia 421 ms   CBC w/ Differential   Result Value Ref Range    WBC 14.2 (H) 4.5 - 11.0 10*9/L    RBC 4.24 (L) 4.50 - 5.20 10*12/L    HGB 13.6 13.5 - 16.0 g/dL    HCT 40.9 (L) 81.1 - 46.0 %    MCV 96.4 80.0 - 100.0 fL    MCH 32.2 26.0 - 34.0 pg    MCHC 33.4 31.0 - 37.0 g/dL    RDW 91.4 78.2 - 95.6 %    MPV 7.9 7.0 - 10.0 fL    Platelet 243 150 - 440 10*9/L    Neutrophils % 68.9 %    Lymphocytes % 22.9 %    Monocytes % 4.8 %    Eosinophils % 1.2 %    Basophils % 0.4 %    Absolute Neutrophils 9.7 (H) 2.0 - 7.5 10*9/L    Absolute Lymphocytes 3.2 1.5 - 5.0 10*9/L    Absolute Monocytes 0.7 0.2 - 0.8 10*9/L    Absolute Eosinophils 0.2 0.0 - 0.4 10*9/L    Absolute Basophils 0.1 0.0 - 0.1 10*9/L    Large Unstained Cells 2 0 - 4 %    Macrocytosis Slight (A) Not Present   CBC w/ Differential   Result Value Ref Range    WBC 7.9 4.5 - 11.0 10*9/L    RBC 3.86 (L) 4.50 - 5.20 10*12/L    HGB 12.5 (L) 13.5 - 16.0 g/dL    HCT  37.4 (L) 41.0 - 46.0 %    MCV 97.0 80.0 - 100.0 fL    MCH 32.4 26.0 - 34.0 pg    MCHC 33.4 31.0 - 37.0 g/dL    RDW 16.1 09.6 - 04.5 %    MPV 8.3 7.0 - 10.0 fL    Platelet 182 150 - 440 10*9/L    Neutrophils % 61.0 %    Lymphocytes % 29.3 %    Monocytes % 6.5 %    Eosinophils % 1.3 %    Basophils % 0.5 % Absolute Neutrophils 4.8 2.0 - 7.5 10*9/L    Absolute Lymphocytes 2.3 1.5 - 5.0 10*9/L    Absolute Monocytes 0.5 0.2 - 0.8 10*9/L    Absolute Eosinophils 0.1 0.0 - 0.4 10*9/L    Absolute Basophils 0.0 0.0 - 0.1 10*9/L    Large Unstained Cells 1 0 - 4 %    Macrocytosis Slight (A) Not Present       PROCEDURES:     I performed no procedures on this patient during this encounter.      Pertinent labs & imaging results that were available during my care of the patient were reviewed by me and considered in my medical decision making. Labs and radiology studies included in my note may not constitute all ordered/reviewied labs during this encounter (see chart for details).    ----------------------------------------------  Electronically Signed By:    Eppie Gibson, MS4  [facility] Emergency Medicine  ----------------------------------------------    I attest that I have reviewed the student note and that the components of the history of the present illness, the physical exam, and the assessment and plan documented were performed by me or were performed in my presence by the student where I verified the documentation and performed (or re-performed) the exam and medical decision making.        Liliane Bade, MD  12/02/17 1030

## 2017-12-02 NOTE — Unmapped (Signed)
Patient rounds complete. Airway intact, breathing even and unlabored. Color appropriate for ethnicity. Pt in NAD. The following needs have been addressed: stretcher low and locked, call light within reach, pt belongings addressed, pain, and toileting. Will continue to monitor and wait for further orders from LIP.

## 2017-12-02 NOTE — Unmapped (Signed)
ED Progress Note     Diagnosis ICD-10-CM Associated Orders   1. Acute cystitis without hematuria N30.00    2. Fatigue, unspecified type R53.83    3. HIV (human immunodeficiency virus infection) (CMS-HCC) B20    4. Intractable vomiting with nausea, unspecified vomiting type R11.2    5. Polysubstance abuse (CMS-HCC) F19.10          11:42 PM  Assumed care from previous provider. Briefly this is a 42 y.o. adult with history of HIV and polysubstance use who presents w/ multiple complaints, but primarily generalized weakness.    Vitals:    12/01/17 2159   BP: 127/70   Pulse:    Resp: 16   Temp:    SpO2: 99%       Work up and treatment so far includes unremarkable exam and normal EKG and vital signs.     Plan is for orthostatic blood pressure, fluids, screening labs, urinalysis, and likely discharge.    Patient has a mild elevation WBC to 14 but this is consistent with patient's baseline.  Lab work otherwise unremarkable.  Plan is to await urinalysis and p.o. Challenge.  Patient has been given Zofran for antiemetic.      1:12 AM patient had immediate emesis when attempting to eat meal tray.  Urinalysis concerning for urinary tract infection.  In the setting of flank pain there is also concern for possible developing pyelonephritis.  Onset less concerning for infected kidney stone.  Patient is on antivirals which they have been taking routinely and states that they have not missed any doses.  Last viral load was undetectable ans last CD4 was 130.  Given patient's potential complicated urinary tract infection versus pyelonephritis and inability to tolerate p.o. and antibiotic allergies will discuss with observation versus admission.    I have discussed patient with family medicine who agreed to admit to the hospital versus observation unit.  Antibiotic decisions deferred to their service as patient is otherwise stable appearing.  We will continue to monitor patient in the emergency room until orders are placed and patient is transferred to the floor.

## 2017-12-02 NOTE — Unmapped (Signed)
Pt reports nausea after attempting to eat her meal tray. Pt states she was only able to take a few bites. Provider notified.

## 2017-12-02 NOTE — Unmapped (Addendum)
#   Acute complicated cystitis: Presented with two days of dysuria, hematuria, increased urinary frequency, sweats, chills, and nausea without fevers, and was non-toxic appearing with VSS in the ED. Diffuse abdominal tenderness and R CVA tenderness on exam. UA on arrival to the ED notable for large leuk esterases, 124 WBCs, 11 RBCs, occasional bacteria. WBC count 14.2. CMP unremarkable, lactate 0.7, lipase normal, normal LFTs/t-bili.  Patient was admitted to the Observation Unit for treatment of acute cystitis without pyelonephritis. GC/CT pending. CT abdomen/pelvis showed terminal ileitis and thickening of bladder wall, consistent with HIV and cystitis respectively. Even after time in observation Unit, pt reporting severe pain and inability to tolerate PO, so was upgraded to inpatient unit on 7/18.  Overnight tolerated IV Levaquin appropriately.  In the morning discussed trying p.o. levofloxacin, and was agreeable.  Tolerated PO medication well.  We will continue to treat as an outpatient with levofloxacin PO for total of 10 days, providing 8 days remaining prescription for at home use.    ??  Chronic Medical Conditions:??  # Substance use disorder (primarily cocaine): U tox on admission positive for cocaine and amphetamines. Also reports intermittent alcohol use. Patient endorses cocaine use on Monday 7/15 and states likely it contained amphetamines. Patient is on suboxone 8/8/4, confirmed on PDMP. She was continued on home Suboxone 8/8/4 while inpatient. Was seen by social work, but patient declined all substance use treatments.   ??  # HIV: Well controlled, most recent viral load checked 08/10/2017 not detectable. Was continued on home tivicay 50 mg tablet daily and home descovy one tablet daily.  Inquired with pharmacy team regarding interactions with antibiotic medication, no reactions noted.  ??  # Depression: Continued home wellbutrin 450 mg daily   ??  # Hypertension: Home HCTZ was held in setting of softer pressures and acute infection.  Recommended holding home HCTZ until patient has followed up with PCP.  ??  # Hormonal therapy: Patient is MTF. Home spironolactone 100 mg BID was held in setting of softer pressures and acute infection, was restarted on 12/03/2017  ??  # BPH: Home flomax was held in setting of softer pressures and acute infection, was restarted on 12/03/2017.

## 2017-12-02 NOTE — Unmapped (Signed)
Pt resting on stretcher, no acute distress noted. Respirations even and unlabored, call bell and belongings within reach. Side rails up x2 and bed position low. Will continue to reassess. Plan of Care. Pt currently sleeping.

## 2017-12-02 NOTE — Unmapped (Signed)
Patient returned from X-ray  Transported by Radiology  How tranported Wheelchair  Cardiac Monitor no

## 2017-12-02 NOTE — Unmapped (Signed)
Pt requesting meal tray at this time. Placed order with NFS. Pt aware of 1 hr wait time.

## 2017-12-02 NOTE — Unmapped (Signed)
Report given to [place], Charity fundraiser. Patient care transferred at this time.
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
## 2017-12-03 LAB — LYMPH MARKER LIMITED,FLOW
ABSOLUTE CD3 CNT: 1856 {cells}/uL (ref 915–3400)
ABSOLUTE CD4 CNT: 333 {cells}/uL — ABNORMAL LOW (ref 510–2320)
ABSOLUTE CD8 CNT: 1475 {cells}/uL (ref 180–1520)
CD3% (T CELLS)": 78 % (ref 61–86)
CD4% (T HELPER)": 14 % — ABNORMAL LOW (ref 34–58)
CD4:CD8 RATIO: 0.2 — ABNORMAL LOW (ref 0.9–4.8)

## 2017-12-03 LAB — CD4:CD8 RATIO: Lab: 0.2 — ABNORMAL LOW

## 2017-12-03 MED ORDER — PROMETHAZINE 12.5 MG TABLET
ORAL_TABLET | Freq: Four times a day (QID) | ORAL | 0 refills | 0.00000 days | Status: CP | PRN
Start: 2017-12-03 — End: 2017-12-03

## 2017-12-03 MED ORDER — LEVOFLOXACIN 750 MG TABLET
ORAL | 0 refills | 0 days | Status: SS
Start: 2017-12-03 — End: 2018-02-23

## 2017-12-03 MED ORDER — NICOTINE 21 MG/24 HR DAILY TRANSDERMAL PATCH: each | 0 refills | 0 days

## 2017-12-03 MED ORDER — NICOTINE 21 MG/24 HR DAILY TRANSDERMAL PATCH
MEDICATED_PATCH | TRANSDERMAL | 0 refills | 0.00000 days | Status: CP
Start: 2017-12-03 — End: 2017-12-03

## 2017-12-03 MED ORDER — PROMETHAZINE 12.5 MG TABLET: 13 mg | tablet | Freq: Four times a day (QID) | 0 refills | 0 days | Status: AC

## 2017-12-03 NOTE — Unmapped (Signed)
Physician Discharge Summary    Admit date: 12/01/2017    Discharge date and time: 12/03/2017    Discharge to: Home    Discharge Service: Family Medicine [facility])    Discharge Attending Physician: Reinaldo Berber, MD    Discharge Diagnoses: See hospital course below    Procedures: See hospital course below    Pertinent Test Results: See hospital course below.    Outpatient follow up:  [ ]  follow up on BP as outpatient, discontinued home HCTZ due to hypotension/ normotensive pressures in hospital    Hospital Course:  # Acute complicated cystitis: Presented with two days of dysuria, hematuria, increased urinary frequency, sweats, chills, and nausea without fevers, and was non-toxic appearing with VSS in the ED. Diffuse abdominal tenderness and R CVA tenderness on exam. UA on arrival to the ED notable for large leuk esterases, 124 WBCs, 11 RBCs, occasional bacteria. WBC count 14.2. CMP unremarkable, lactate 0.7, lipase normal, normal LFTs/t-bili.  Patient was admitted to the Observation Unit for treatment of acute cystitis without pyelonephritis. GC/CT pending. CT abdomen/pelvis showed terminal ileitis and thickening of bladder wall, consistent with HIV and cystitis respectively. Even after time in observation Unit, pt reporting severe pain and inability to tolerate PO, so was upgraded to inpatient unit on 7/18.  Overnight tolerated IV Levaquin appropriately.  In the morning discussed trying p.o. levofloxacin, and was agreeable.  Tolerated PO medication well.  We will continue to treat as an outpatient with levofloxacin PO for total of 10 days, providing 8 days remaining prescription for at home use.    ??  Chronic Medical Conditions:??  # Substance use disorder (primarily cocaine): U tox on admission positive for cocaine and amphetamines. Also reports intermittent alcohol use. Patient endorses cocaine use on Monday 7/15 and states likely it contained amphetamines. Patient is on suboxone 8/8/4, confirmed on PDMP. She was continued on home Suboxone 8/8/4 while inpatient. Was seen by social work, but patient declined all substance use treatments.   ??  # HIV: Well controlled, most recent viral load checked 08/10/2017 not detectable. Was continued on home tivicay 50 mg tablet daily and home descovy one tablet daily.  Inquired with pharmacy team regarding interactions with antibiotic medication, no reactions noted.  ??  # Depression: Continued home wellbutrin 450 mg daily   ??  # Hypertension: Home HCTZ was held in setting of softer pressures and acute infection.  Recommended holding home HCTZ until patient has followed up with PCP.  ??  # Hormonal therapy: Patient is MTF. Home spironolactone 100 mg BID was held in setting of softer pressures and acute infection, was restarted on 12/03/2017  ??  # BPH: Home flomax was held in setting of softer pressures and acute infection, was restarted on 12/03/2017.??         Condition at Discharge: good  Discharge Medications:      Your Medication List      STOP taking these medications    hydroCHLOROthiazide 25 MG tablet  Commonly known as:  HYDRODIURIL        START taking these medications    levoFLOXacin 750 MG tablet  Commonly known as:  LEVAQUIN  Take 1 tablet (750 mg total) by mouth daily. for 8 days  Start taking on:  12/04/2017     promethazine 12.5 MG tablet  Commonly known as:  PHENERGAN  Take 1 tablet (12.5 mg total) by mouth every six (6) hours as needed for nausea. for up to 7 days  CHANGE how you take these medications    nicotine 21 mg/24 hr patch  Commonly known as:  NICODERM CQ  Place 1 patch on the skin daily.  What changed:  when to take this        CONTINUE taking these medications    albuterol 90 mcg/actuation inhaler  Commonly known as:  PROVENTIL HFA;VENTOLIN HFA  Inhale 2 puffs every four (4) hours as needed for wheezing.     buprenorphine-naloxone 4-1 mg Film sublingual film  Commonly known as:  SUBOXONE  Place 1 Film (4 mg total) under the tongue every evening. for 14 days buprenorphine-naloxone 8-2 mg sublingual film  Commonly known as:  SUBOXONE  Place 1 Film (8 mg total) under the tongue Two (2) times a day. for 14 days     buPROPion 150 MG 24 hr tablet  Commonly known as:  WELLBUTRIN XL  Take 3 tablets (450 mg total) by mouth daily. for 14 days     dolutegravir 50 mg Tab TABLET  Commonly known as:  TIVICAY  Take 1 tablet (50 mg total) by mouth daily.     emtricitabine-tenofovir alafen 200-25 mg tablet  Commonly known as:  DESCOVY  Take 1 tablet by mouth daily.     estradiol valerate 10 mg/mL Oil injection  Commonly known as:  DELESTROGEN  Inject 10 mg into the muscle every seven (7) days.     medroxyPROGESTERone 10 MG tablet  Commonly known as:  PROVERA  Take 1 tablet (10 mg total) by mouth daily. for 14 days     melatonin 3 mg Tab  Take 2 tablets (6 mg total) by mouth nightly as needed (sleep). for up to 28 doses     spironolactone 100 MG tablet  Commonly known as:  ALDACTONE  Take 1 tablet (100 mg total) by mouth Two (2) times a day. for 14 days     tamsulosin 0.4 mg capsule  Commonly known as:  FLOMAX  Take 1 capsule (0.4 mg total) by mouth daily. for 14 days            Pending Test Results:      Order Current Status    Buprenorphine and Metabolite, Urine In process          Discharge Instructions:   Activity Instructions     Activity as tolerated            Follow Up instructions and Outpatient Referrals     Call MD for:  difficulty breathing, headache or visual disturbances      Call MD for:  persistent dizziness or light-headedness      Call MD for:  persistent nausea or vomiting      Call MD for: Temperature > 38.5 Celsius ( > 101.3 Fahrenheit)      Discharge instructions      You were admitted to the hospital to care for your infection of your urinary tract extending to your kidneys. We tested your urine as well as imaged your abdomen and have evidence of the infection on both. We treated you with antibiotics- you were having some nausea and one episode of vomiting with the medication pills, so we put in an IV line to give you some of the antibiotics for a week. We recommend to continue the antibiotics for the next 8 days at home.                  I spent greater than 30 minutes  in the  discharge of this patient.    Jeronimo Greaves MD MPH  PGY-1 Family Medicine

## 2017-12-03 NOTE — Unmapped (Signed)
Admitted in the room. Alert and oriented x4. Breathing normal easy and regular on room air. Self care. Verbalizes needs. Slept fairly well. Bed kept at lowest position, brakes kept locked, call bell and phone kept in reach. Patient is updated of new orders, plans, treatment and procedures. Maintained on antibiotic. Afebrile. Vital signs monitored by protocol.    Problem: Adult Inpatient Plan of Care  Goal: Plan of Care Review  Outcome: Progressing  Goal: Patient-Specific Goal (Individualization)  Outcome: Progressing  Flowsheets (Taken 12/03/2017 0444)  Individualized Care Needs: No nausea thru end of shift.  Anxieties, Fears or Concerns: Pain will be tolerable thru end of shift.  Goal: Absence of Hospital-Acquired Illness or Injury  Outcome: Progressing  Goal: Optimal Comfort and Wellbeing  Outcome: Progressing  Goal: Readiness for Transition of Care  Outcome: Progressing  Goal: Rounds/Family Conference  Outcome: Progressing     Problem: HIV/AIDS Infection  Goal: HIV/AIDS Symptom Control  Outcome: Progressing     Problem: Opioid Dependence or Withdrawal  Goal: Withdrawal Symptom Control  Outcome: Progressing     Problem: Pain Chronic (Persistent) (Comorbidity Management)  Goal: Acceptable Pain Control and Functional Ability  Outcome: Progressing   ?

## 2017-12-03 NOTE — Unmapped (Signed)
Pharmacist Discharge Note  Patient Name: Ryan Robinson  Reason for admission: pyelonephritis  Reason for writing this note: new diagnosis with new medication    Highlighted medication changes with rationale (if applicable):  - Started levofloxacin 750 mg daily (end date 12/11/17) for complicated pyelonephritis  - Stopped HCTZ 25 mg daily for hypotension    Medication access:  - No barriers identified    Outpatient follow-up:  [ ]  follow-up blood pressure and ability to tolerate restarting HCTZ    Niana [facility]   Clinical Pharmacist    No future appointments.

## 2017-12-03 NOTE — Unmapped (Signed)
Pt is alert and oriented x 4 and on room air.  Went over discharge instructions with patient and patient voiced understanding.  Pt will be discharging home.  Transportation given taxi voucher.  Pt had PIV removed.  Pt will be going to outpatient pharmacy and then home.  No questions or concerns.  Problem: Adult Inpatient Plan of Care  Goal: Plan of Care Review  Outcome: Progressing  Goal: Patient-Specific Goal (Individualization)  Outcome: Progressing  Flowsheets  Taken 12/03/2017 0444 by Lennox Grumbles, RN  Individualized Care Needs: No nausea thru end of shift.  Anxieties, Fears or Concerns: Pain will be tolerable thru end of shift.  Taken 12/03/2017 1525 by Truddie Coco, RN  Patient-Specific Goals (Include Timeframe): Pt will remain free of falls prior to discharge on 12/03/17  Goal: Absence of Hospital-Acquired Illness or Injury  Outcome: Progressing  Goal: Optimal Comfort and Wellbeing  Outcome: Progressing  Goal: Readiness for Transition of Care  Outcome: Progressing  Goal: Rounds/Family Conference  Outcome: Progressing     Problem: HIV/AIDS Infection  Goal: HIV/AIDS Symptom Control  Outcome: Progressing     Problem: Opioid Dependence or Withdrawal  Goal: Withdrawal Symptom Control  Outcome: Progressing     Problem: Pain Chronic (Persistent) (Comorbidity Management)  Goal: Acceptable Pain Control and Functional Ability  Outcome: Progressing

## 2017-12-04 LAB — NORBUPRENORPHINE, URINE: Norbuprenorphine:MCnc:Pt:Urine:Qn:: 1417.9

## 2017-12-04 LAB — BUPRENORPHINE AND METABOLITE, URINE: BUPRENORPHINE, URINE: 444.5 ng/mL

## 2017-12-04 MED ORDER — LEVOFLOXACIN 750 MG TABLET
ORAL_TABLET | ORAL | 0 refills | 0.00000 days | Status: CP
Start: 2017-12-04 — End: 2017-12-04

## 2017-12-08 NOTE — Unmapped (Signed)
Duration of Intervention: 15 minutes    SW received voicemail from pt stating she moved into new apartment but has no furniture or kitchen supplies. She is wondering if there are any local agencies that can help. SW sent message to [place] in [place] to inquire if they can help.     Bradly Bienenstock LCSWA, CHES

## 2017-12-10 MED FILL — BUPROPION HCL XL/150MG/TB24: BUPROPION HCL XL/150MG/TB24 | 30 days supply | Qty: 90 | Fill #8

## 2017-12-10 MED FILL — HYDROCHLOROTHIAZIDE/25MG/TABS: HYDROCHLOROTHIAZIDE/25MG/TABS | 30 days supply | Qty: 30 | Fill #8

## 2017-12-10 MED FILL — TAMSULOSIN/0.4MG/CAP: TAMSULOSIN/0.4MG/CAP | 30 days supply | Qty: 30 | Fill #8

## 2017-12-10 MED FILL — TIVICAY/50MG/TAB: TIVICAY/50MG/TAB | 30 days supply | Qty: 30 | Fill #0

## 2017-12-10 MED FILL — GABAPENTIN/300MG/CAPS: GABAPENTIN/300MG/CAPS | 30 days supply | Qty: 360 | Fill #7

## 2017-12-10 MED FILL — DESCOVY/200MG/25MG/TABS: DESCOVY/200MG/25MG/TABS | 30 days supply | Qty: 30 | Fill #0

## 2017-12-10 NOTE — Unmapped (Signed)
[facility] Refill Coordination Note  Specialty Medication(s): Descovy 200mg /25mg , Tivicay 50mg   Additional Medications shipped: Gabapentin 300mg , Bupropion 150mg , Tamsulosin 0.4mg , Hydrochlorothiazide 25mg     Ryan Robinson, DOB: [birth year]  Phone: [phone] (home) , Alternate phone contact: N/A  Phone or address changes today?: Yes  All above HIPAA information was verified with patient.  Shipping Address: [address]   [place] [address]  Insurance changes? No    Completed refill call assessment today to schedule patient's medication shipment from the [facility] [phone]).      Confirmed the medication and dosage are correct and have not changed: Yes, regimen is correct and unchanged.    Confirmed patient started or stopped the following medications in the past month:  No, there are no changes reported at this time.    Are you tolerating your medication?:  Ryan Robinson reports tolerating the medication.    ADHERENCE  Did you miss any doses in the past 4 weeks? No missed doses reported.    FINANCIAL/SHIPPING    Delivery Scheduled: Yes, Expected medication delivery date: 12/10/2017 via same day courier.     The patient will receive an FSI print out for each medication shipped and additional FDA Medication Guides as required.  Patient education from [name] or [name] may also be included in the shipment    Ryan Robinson did not have any additional questions at this time.    Delivery address validated in FSI scheduling system: Yes, address listed in FSI is correct.    We will follow up with patient monthly for standard refill processing and delivery.      Thank you,  Takyra Cantrall  Anders Grant   [facility] Specialty Pharmacist

## 2017-12-13 NOTE — Unmapped (Signed)
Last office Visit: 12/10/2016  Last VL: 40  3 Phone Calls, 1 Letter    Unable to retain patient in HIV care in the [facility] ID clinic.  Patient has been referred to the [facility] HIV Office manager for assistance in re-engagement in HIV care services.  [facility] services are no longer needed at this time.     Kirkland Hun, MS  ID Clinic Bridge Counselor    Duration of Service: 6 minutes

## 2017-12-13 NOTE — Unmapped (Signed)
Duration of Intervention: 15 minutes    Pt came to clinic to meet with SW and nurse. Pt in new apartment and has bed, sheets, and loveseat but still missing tables, TV, microwave and other kitchen supplies. Pt got this initial furniture from [facility]. Pt reported difficulty in stabilizing mental health and processing safety of new apartment. Pt is worried she will self-sabotage as she usually does with things she doesn't believe she's deserved. SW processed this feeling with pt. Pt is rx Wellbutrin by PCP at [facility] Dr. Marshall Cork. Pt expressed concerned with overactive bladder and believes it is from Flomax, Spiralactone and blood pressure rx ([facility]?), pt unsure the name. SW forwarded this to ID clinic pharmacist who stated pt should contact provider at [facility].     REASON FOR DISCHARGE: Pt referred to state bridge counselor.    EFFECTIVE DATE:  12/14/17    OUTSIDE REFERRALS MADE: None    Bradly Bienenstock LCSWA, CHES

## 2017-12-13 NOTE — Unmapped (Signed)
Spoke to patient who had several messages while this Clinical research associate was out;  Patient was in hospital for UTI but has not taken abx.  Patient states she is urinating blood and is having both back and abdominal pain.   Advised patient to go back to ED for further evaluation.  Patient has agreed but wants to stop by clinic to see SW, Bradly Bienenstock first.  Patient states she will go to ED after that meeting.   Carley Hammed, RN

## 2017-12-27 NOTE — Unmapped (Signed)
Called patient to advise him of his appointment scheduled
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
## 2018-01-10 NOTE — Unmapped (Signed)
[facility] Refill Coordination Note  Medication: Descovy and Tivicay    Unable to reach patient to schedule shipment for medication being filled at [facility]. Left voicemail on phone.  As this is the 3rd unsuccessful attempt to reach the patient, no additional phone call attempts will be made at this time.      Phone numbers attempted: [phone]    Last scheduled delivery: 12/10/17    Please call the [facility] at [phone] (option [phone]) should you have any further questions.      Thanks,  [facility] Specialty Team

## 2018-01-12 NOTE — Unmapped (Signed)
Duration of Intervention: 5 minutes    SW attempted to call pt but no answer. Left discreet voicemail. SW called pt housing case manager and left message for pt new phone number or update on situation.     Bradly Bienenstock LCSWA, CHES
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
## 2018-01-19 NOTE — Unmapped (Signed)
Duration of Intervention: 5 minutes    SW attempted to call pt to remind of appt tomorrow and assess for barriers but no answer. SW left voicemail.     Bradly Bienenstock LCSWA, CHES

## 2018-01-20 NOTE — Unmapped (Signed)
Duration of Intervention: 15 minutes    SW contacted pt housing case manager Sheree to make plan for pt to get back into care. Pt has appt with Sheree next week and the housing case manager will put it in their care plan for pt to come to appt.   Bradly Bienenstock LCSWA, CHES

## 2018-01-24 NOTE — Unmapped (Addendum)
01/28/18: UPDATE: Due to a shipping error, patient's Descovy and Tivicay was sent by UPS and was delivered patient's apartment complex office on 01/26/18 :GMR    [facility] Refill and Clinical Coordination Note: HIV     HIV Medication(s): Descovy and Tivicay  Additional Medication(s): gabapentin, bupropion XL, tamsulosin, and HCTZ     Ryan Robinson, DOB: [birth year]  Phone: [phone] (home) , Alternate phone contact: N/A  Shipping address: [address], [address]  [place] [address]  Phone or address changes today?: No  All above HIPAA information verified.  Insurance changes? No    Completed refill and clinical call assessment today to schedule patient's medication shipment from the [facility] [phone]).      MEDICATION RECONCILIATION    Confirmed the medication and dosage are correct and have not changed: Yes, regimen is correct and unchanged.    Were there any changes to your medication(s) in the past month:  No, there are no changes reported at this time.    HIV-related labs:  Lab Results   Component Value Date/Time    HIVRS Not Detected 08/10/2017 06:00 PM    HIVRS Not Detected 11/27/2016 02:53 PM    HIVRS Detected (A) 07/18/2016 07:57 AM    HIVCP <40 (H) 07/18/2016 07:57 AM    HIVCP 5614 08/13/2014 05:24 AM    HIV10  07/18/2016 07:57 AM      Comment:      <1.6 log    HIV10 3.75 08/13/2014 05:24 AM    HIVCM  08/10/2017 06:00 PM      Comment:      HIV-1 quantification by real-time RT-PCR is performed using the Abbott RealTime  HIV-1 test. This test is FDA approved and can quantitate HIV-1 RNA over the  range of 40 - 1,000,000 copies/mL (1.60 log(10) -6.00 log(10) copies/mL). The reference range for this assay is Not Detected.    HIVCM  11/27/2016 02:53 PM      Comment:      HIV-1 quantification by real-time RT-PCR is performed using the Abbott RealTime  HIV-1 test. This test is FDA approved and can quantitate HIV-1 RNA over the  range of 40 - 1,000,000 copies/mL (1.60 log(10) -6.00 log(10) copies/mL). The reference range for this assay is Not Detected.    HIVCM  07/18/2016 07:57 AM      Comment:      HIV-1 quantification by real-time RT-PCR is performed using the Abbott RealTime  HIV-1 test. This test is FDA approved and can quantitate HIV-1 RNA over the  range of 40 - 1,000,000 copies/mL (1.60 log(10) -6.00 log(10) copies/mL). The reference range for this assay is Not Detected.    HIVCM : 08/13/2014 05:24 AM    ACD4 333 (L) 12/02/2017 06:05 AM    ACD4 544 08/08/2017 09:35 AM    ACD4 413 (L) 11/27/2016 02:53 PM    ACD4 <10 (L) 08/13/2014 05:24 AM         ADHERENCE      Number of tablets of HIV Medication(s) at home: 0    Did you miss any doses in the past 4 weeks? Yes.  Ryan Robinson reports missing 1 days of medication therapy in the last 4 weeks.  Ryan Robinson reports feeling emotionally down and had difficulty calling us back when we were open as the cause of their non-adherance.  Adherence counseling provided? Not needed     SIDE EFFECT MANAGEMENT    Are you tolerating your medication?:  Ryan Robinson  reports tolerating the medication.  Side effect management discussed: None      Therapy is appropriate and should be continued.    The patient will receive a drug information handout and additional FDA Medication Guides as required.    Evidence of clinical benefit: See Epic note from 08/10/17      FINANCIAL/SHIPPING    Delivery Scheduled: Yes, Expected medication delivery date: 01/25/18 as a Same Day Delivery via [facility]   Additional medications refilled: No additional medications/refills needed at this time.    Ryan Robinson did not have any additional questions at this time.      Delivery address validated in Epic.    We will follow up with patient monthly for standard refill processing and delivery.      Thank you,  Roderic Palau   [facility] Specialty Pharmacist

## 2018-01-25 MED ORDER — EMTRICITABINE 200 MG-TENOFOVIR ALAFENAMIDE FUMARATE 25 MG TABLET
ORAL_TABLET | ORAL | 0 refills | 0 days | Status: CP
Start: 2018-01-25 — End: 2018-03-02

## 2018-01-25 MED ORDER — DOLUTEGRAVIR 50 MG TABLET
ORAL_TABLET | ORAL | 0 refills | 0 days | Status: CP
Start: 2018-01-25 — End: 2018-02-17
  Filled 2018-01-25: qty 30, 30d supply, fill #0

## 2018-01-25 MED FILL — HYDROCHLOROTHIAZIDE 25 MG TABLET: 30 days supply | Qty: 30 | Fill #0 | Status: AC

## 2018-01-25 MED FILL — DESCOVY 200 MG-25 MG TABLET: 30 days supply | Qty: 30 | Fill #0 | Status: AC

## 2018-01-25 MED FILL — TIVICAY 50 MG TABLET: 30 days supply | Qty: 30 | Fill #0 | Status: AC

## 2018-01-25 MED FILL — BUPROPION HCL XL 150 MG 24 HR TABLET, EXTENDED RELEASE: 30 days supply | Qty: 90 | Fill #0 | Status: AC

## 2018-01-25 MED FILL — GABAPENTIN 300 MG CAPSULE: 30 days supply | Qty: 360 | Fill #0

## 2018-01-25 MED FILL — GABAPENTIN 300 MG CAPSULE: 30 days supply | Qty: 360 | Fill #0 | Status: AC

## 2018-01-25 MED FILL — DESCOVY 200 MG-25 MG TABLET: ORAL | 30 days supply | Qty: 30 | Fill #0

## 2018-01-25 MED FILL — TAMSULOSIN 0.4 MG CAPSULE: 30 days supply | Qty: 30 | Fill #0 | Status: AC

## 2018-01-25 NOTE — Unmapped (Signed)
Patient requested refill of Wellbutrin XL 450 mg tablets daily.  Unable to fill as patient needs to follow up with his outpatient provider for ongoing  Management of his depression.   Bonnetta Barry PMHNP

## 2018-01-28 NOTE — Unmapped (Signed)
Left a second voice mail for Ryan Robinson to call the pharamacy regarding medicine not being received on 01/25/18.  I told patient to call the pharmacy immediately so we can resolve the problem.     [name]. [place], [place].[name]  [phone] option 4

## 2018-01-28 NOTE — Unmapped (Signed)
Left patient a voice mail at appoximately 04:35 pm on 01/27/18 to ask about medicine patient reported as missing on 01/25/18.  I told patient that her medicine was left on the front porch by the courier on 01/25/18 and a text was sent to her to notify her of the delivery.    I asked patient to call the pharmacy at [phone] option 4.    [name]. [place], [place].[name]  [phone] option 4

## 2018-01-30 NOTE — Unmapped (Addendum)
Talked to patient today and told her that her medication was left at the apartment complex office and was signed for by [employer].    I told her we have been leaving messages on her cell phone since 01/27/18 but have not heard back from her.  She stated that she did not get those messages and that sometimes her phone does not work well.  She said she will retrieve her HIV medications from her apartment complex office tomorrow, 01/31/18.    Also, she gave me permission to talk to her emergency contact if I ever have trouble reaching her again.    Ryan Robinson. [place], [place].[name]  [phone] option 4

## 2018-02-01 MED ORDER — ALBUTEROL SULFATE HFA 90 MCG/ACTUATION AEROSOL INHALER
RESPIRATORY_TRACT | 0 refills | 0 days | Status: SS
Start: 2018-02-01 — End: 2018-02-23

## 2018-02-01 NOTE — Unmapped (Signed)
Message left on nurse voicemail stating she has had a fever for 2 days and has a rash on her chest.  She states she has been near someone with active shingles.    I am sending this message to Bradly Bienenstock, SW who works with this patient in [place] and [place], Charity fundraiser.    Carley Hammed, RN

## 2018-02-03 MED FILL — VENTOLIN HFA 90 MCG/ACTUATION AEROSOL INHALER: 30 days supply | Qty: 18 | Fill #0 | Status: AC

## 2018-02-03 MED FILL — VENTOLIN HFA 90 MCG/ACTUATION AEROSOL INHALER: RESPIRATORY_TRACT | 30 days supply | Qty: 18 | Fill #0

## 2018-02-14 ENCOUNTER — Ambulatory Visit: Admit: 2018-02-14 | Discharge: 2018-02-15 | Discharge status: Against Medical Advice | Payer: MEDICARE

## 2018-02-14 NOTE — Unmapped (Signed)
Pt came to desk SOB and chest pain EKG ordered

## 2018-02-15 NOTE — Unmapped (Signed)
Bed: 92-A  Expected date:   Expected time:   Means of arrival:   Comments:

## 2018-02-15 NOTE — Unmapped (Signed)
Duration of Intervention: 5 minutes    SW called pt to check in but no response. SW left voicemail.     Bradly Bienenstock LCSWA, CHES

## 2018-02-17 MED ORDER — DOLUTEGRAVIR 50 MG TABLET
ORAL_TABLET | ORAL | 0 refills | 0 days | Status: CP
Start: 2018-02-17 — End: 2018-03-02
  Filled 2018-03-02: qty 30, 30d supply, fill #0

## 2018-02-17 NOTE — Unmapped (Signed)
These medications were prescribed while patient was in Emergency room.  Follow up will need to be with his primary care provider.  Patient should call to schedule an appointment with his outpatient treatment team.

## 2018-02-18 ENCOUNTER — Ambulatory Visit: Admit: 2018-02-18 | Discharge: 2018-03-02 | Disposition: A | Discharge status: Home / Self Care | Payer: MEDICARE

## 2018-02-18 DIAGNOSIS — R45851 Suicidal ideations: Principal | ICD-10-CM

## 2018-02-18 LAB — COMPREHENSIVE METABOLIC PANEL
ALKALINE PHOSPHATASE: 45 U/L (ref 38–126)
ALT (SGPT): 46 U/L (ref 13–69)
ANION GAP: 11 mmol/L (ref 7–15)
AST (SGOT): 43 U/L (ref 17–47)
BILIRUBIN TOTAL: 0.7 mg/dL (ref 0.0–1.2)
BLOOD UREA NITROGEN: 10 mg/dL (ref 7–21)
BUN / CREAT RATIO: 11
CALCIUM: 9.4 mg/dL (ref 8.5–10.2)
CHLORIDE: 103 mmol/L (ref 98–107)
CO2: 22 mmol/L (ref 22.0–30.0)
EGFR CKD-EPI AA FEMALE: 85 mL/min/{1.73_m2} (ref >=60)
EGFR CKD-EPI AA MALE: >90 mL/min/{1.73_m2} (ref >=60)
EGFR CKD-EPI NON-AA FEMALE: 74 mL/min/{1.73_m2} (ref >=60)
EGFR CKD-EPI NON-AA MALE: >90 mL/min/{1.73_m2} (ref >=60)
GLUCOSE RANDOM: 102 mg/dL (ref 65–179)
POTASSIUM: 3.7 mmol/L (ref 3.5–5.0)
PROTEIN TOTAL: 8.1 g/dL (ref 6.5–8.3)
SODIUM: 136 mmol/L (ref 135–145)

## 2018-02-18 LAB — CBC W/ AUTO DIFF
BASOPHILS ABSOLUTE COUNT: 0.1 10*9/L (ref 0.0–0.1)
BASOPHILS RELATIVE PERCENT: 1.3 %
EOSINOPHILS ABSOLUTE COUNT: 0 10*9/L (ref 0.0–0.4)
EOSINOPHILS RELATIVE PERCENT: 0.3 %
HEMOGLOBIN: 15.6 g/dL (ref 13.5–16.0)
LARGE UNSTAINED CELLS: 2 % (ref 0–4)
LYMPHOCYTES ABSOLUTE COUNT: 2.9 10*9/L (ref 1.5–5.0)
LYMPHOCYTES RELATIVE PERCENT: 37 %
MEAN CORPUSCULAR HEMOGLOBIN CONC: 34.2 g/dL (ref 31.0–37.0)
MEAN CORPUSCULAR HEMOGLOBIN: 32 pg (ref 26.0–34.0)
MEAN CORPUSCULAR VOLUME: 93.5 fL (ref 80.0–100.0)
MEAN PLATELET VOLUME: 7.3 fL (ref 7.0–10.0)
MONOCYTES ABSOLUTE COUNT: 0.5 10*9/L (ref 0.2–0.8)
MONOCYTES RELATIVE PERCENT: 6.6 %
NEUTROPHILS ABSOLUTE COUNT: 4.1 10*9/L (ref 2.0–7.5)
NEUTROPHILS RELATIVE PERCENT: 52.8 %
PLATELET COUNT: 311 10*9/L (ref 150–440)
RED CELL DISTRIBUTION WIDTH: 12.9 % (ref 12.0–15.0)

## 2018-02-18 LAB — COCAINE(METAB.)SCREEN, URINE

## 2018-02-18 LAB — TOXICOLOGY SCREEN, URINE
BARBITURATE SCREEN URINE: <200
METHADONE SCREEN, URINE: <300
OPIATE SCREEN URINE: <300

## 2018-02-18 LAB — URINALYSIS WITH CULTURE REFLEX
BILIRUBIN UA: NEGATIVE
BLOOD UA: NEGATIVE
GLUCOSE UA: NEGATIVE
KETONES UA: NEGATIVE
NITRITE UA: NEGATIVE
PH UA: 5.5 (ref 5.0–9.0)
SPECIFIC GRAVITY UA: 1.022 (ref 1.003–1.030)
SQUAMOUS EPITHELIAL: 1 /HPF (ref 0–5)
UROBILINOGEN UA: 2 — AB
WBC UA: 56 /HPF — ABNORMAL HIGH (ref <=2)

## 2018-02-18 LAB — THYROID STIMULATING HORMONE: Thyrotropin:ACnc:Pt:Ser/Plas:Qn:: 1.359

## 2018-02-18 LAB — AST (SGOT): Aspartate aminotransferase:CCnc:Pt:Ser/Plas:Qn:: 43

## 2018-02-18 LAB — EOSINOPHILS ABSOLUTE COUNT: Lab: 0

## 2018-02-18 LAB — PROTEIN UA

## 2018-02-18 LAB — ETHANOL: Ethanol:MCnc:Pt:Ser/Plas:Qn:GC: <10

## 2018-02-18 NOTE — Unmapped (Addendum)
First pt interaction. Pt agitated, pacing, and using profanity towards staff and saying that they will just leave because no one is talking to them. This RN attempting to diffuse situation but pt continuing to be hostile and saying you don't know me bitch! Get out of my face!. Pt continuing to be verbally abusive. This Rn offering multiple things and pt continuing to refuse. This RN working on getting pt a room out of the hall.   Pt rounding complete. No call bell present due to safety precautions. Pt in NAD, remains on q 15 minute checks monitored by NA. This RN visualized respirations even and unlabored. Pt belongings remain secured outside of pt room.

## 2018-02-18 NOTE — Unmapped (Signed)
Here for SI for a few weeks that is worse today. Reports plan to OD. Attempted to OD last month. Pt recent stress: Just personal life. Hx of bipolar do and has not been taking psych meds as prescribed since 8 days ago. Pt reports, My doctor did not send it to the right pharmacy. Reports not taking HIV meds since 2 months ago for same reason. Pt reports binge using crack/cocaine and marijuana recently.

## 2018-02-18 NOTE — Unmapped (Signed)
PT provided graham crackers and pb and ginger ale. Pt more calm at present.
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
## 2018-02-18 NOTE — Unmapped (Signed)
[name] in to see pt.

## 2018-02-19 NOTE — Unmapped (Signed)
Pt rounding complete. No call bell present due to safety precautions. Pt in NAD, remains on q 15 minute checks monitored by NA. This RN visualized respirations even and unlabored. Pt belongings remain secured outside of pt room.
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
## 2018-02-19 NOTE — Unmapped (Signed)
Report called to Morrie Sheldon, RN in [facility]

## 2018-02-19 NOTE — Unmapped (Signed)
Patient rounds completed. The following patient needs were addressed:  Pain, Toileting, Plan of Care and Bed Position Low . Pt resting in stretcher. NAD noted. VSS. CCTV monitoring, Q81min checks, bed lowered to ground, side rails up and safety maintained. RN will continue to monitor Pt. No sharp items noted. Environmental check completed. Pt room safe and appropriate. Q36min checks being completed by CST/PNA on treatment floor, CST/PNA continuous monitoring CCTV, and RN monitoring completed. Safety maintained.

## 2018-02-19 NOTE — Unmapped (Signed)
Bed: G111  Expected date:   Expected time:   Means of arrival:   Comments:  rm 25

## 2018-02-19 NOTE — Unmapped (Signed)
PT eating initial mealtray, calm and cooperative.

## 2018-02-19 NOTE — Unmapped (Signed)
Patient has had breakfast and is in her room watching tv

## 2018-02-19 NOTE — Unmapped (Signed)
Patient rounds completed. The following patient needs were addressed:  Pain, Toileting, Plan of Care and Bed Position Low . Pt resting in stretcher. NAD noted. VSS. CCTV monitoring, Q42min checks, bed lowered to ground, side rails up and safety maintained. RN will continue to monitor Pt. No sharp items noted. Environmental check completed. Pt room safe and appropriate. Pt sleeping in bed at this time. RR equal and unlabored. Chest rise equal. Q54min checks being completed by CST/PNA on treatment floor, CST/PNA continuous monitoring CCTV, and RN monitoring completed. Safety maintained.

## 2018-02-19 NOTE — Unmapped (Signed)
Patient has been in bed resting. She said she needed some quiet time
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
## 2018-02-19 NOTE — Unmapped (Addendum)
Patient rounds completed. The following patient needs were addressed:  Pain, Toileting, Plan of Care and Bed Position Low . Pt resting in stretcher. NAD noted. VSS. CCTV monitoring, Q76min checks, bed lowered to ground, side rails up and safety maintained. RN will continue to monitor Pt. No sharp items noted. Environmental check completed. Pt room safe and appropriate. Q14min checks being completed by CST/PNA on treatment floor, CST/PNA continuous monitoring CCTV, and RN monitoring completed. Safety maintained.  RN report to AM RN.  Zero visitors at this time.    Pt has been cooperative and calm this evening. Pt had some issues attempting to sleep but fell asleep around 0150. Pt denies HI & AVH but endorses SI. Pt contracts for safety on unit. Pt denies needs. RN will continue to monitor.

## 2018-02-19 NOTE — Unmapped (Signed)
Report given to [facility], RN

## 2018-02-19 NOTE — Unmapped (Addendum)
Patient rounds completed. The following patient needs were addressed:  Pain, Toileting, Plan of Care and Bed Position Low . Pt resting in stretcher. NAD noted. VSS. CCTV monitoring, Q81min checks, bed lowered to ground, side rails up and safety maintained. RN will continue to monitor Pt. No sharp items noted. Environmental check completed. Pt room safe and appropriate. Q36min checks being completed by CST/PNA on treatment floor, CST/PNA continuous monitoring CCTV, and RN monitoring completed. Safety maintained.

## 2018-02-19 NOTE — Unmapped (Signed)
Pt switched to a more private room. PT continues to be irritable and withdrawn while making accusations that i've been here for hours and everyone is allowed to just lie to me. Pt refusing to talk to this RN and citing that just a lot of personal things are going on.  This RN explaining that patient is coming to the ED for help but that we are unable to help her if pt is not going to talk to [place]. Pt still refusing to discuss anything. This Rn asking pt if pt is having any pain. Pt now saying that for the last two months, pt has not taken any of the HIV medications b/c they were sent to the wrong pharmacy. Pt also reports not taking other regularly scheduled medications for the last eight days. Pt endorses weight loss, sweats, decreased appetite, general malaise, and increased fatigue. Pt endorses plan to OD but unwilling to discuss it further and saying that it is personal and I don't want to start crying. Pt denies other complaints, now agreeable to treatment plan.  Pt rounding complete. No call bell present due to safety precautions. Pt in NAD, remains on q 15 minute checks monitored by NA. This RN visualized respirations even and unlabored. Pt belongings remain secured outside of pt room.

## 2018-02-19 NOTE — Unmapped (Signed)
Pt at nursing station using phone. 

## 2018-02-19 NOTE — Unmapped (Signed)
The patient bed is lock, low and safe. Pt ate 100% of her dinner.

## 2018-02-19 NOTE — Unmapped (Signed)
Pt resting comfortably in stretcher at this time, NAD. Resp equal and unlabored. Skin pink. Pt refusing to answer any further questions from this RN at this time. Remains calm. PNA remains at bedside at this time. Bed in low, locked position.
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
## 2018-02-19 NOTE — Unmapped (Signed)
Pt. Received lunch tray  °

## 2018-02-20 LAB — BACTERIA

## 2018-02-20 LAB — URINALYSIS WITH CULTURE REFLEX
BILIRUBIN UA: NEGATIVE
BLOOD UA: NEGATIVE
GLUCOSE UA: NEGATIVE
KETONES UA: NEGATIVE
NITRITE UA: NEGATIVE
PH UA: 5.5 (ref 5.0–9.0)
RBC UA: 3 /HPF (ref <=3)
SPECIFIC GRAVITY UA: 1.03 (ref 1.003–1.030)
SQUAMOUS EPITHELIAL: 1 /HPF (ref 0–5)
UROBILINOGEN UA: 0.2
WBC UA: 25 /HPF — ABNORMAL HIGH (ref <=2)

## 2018-02-20 NOTE — Unmapped (Signed)
Patient rounds completed. The following patient needs were addressed:  Pain, Toileting, Plan of Care and Bed Position Low . Pt resting in stretcher. NAD noted. VSS. CCTV monitoring, Q42min checks, bed lowered to ground, side rails up and safety maintained. RN will continue to monitor Pt. No sharp items noted. Environmental check completed. Pt room safe and appropriate. Pt sleeping in bed at this time. RR equal and unlabored. Chest rise equal. Q54min checks being completed by CST/PNA on treatment floor, CST/PNA continuous monitoring CCTV, and RN monitoring completed. Safety maintained.

## 2018-02-20 NOTE — Unmapped (Signed)
This RN was approached by pt and asked for melatonin. This RN stated that due to not being primary nurse, it was unknown what orders pt had. Pt became frustrated and asked why can't you just look?    When this RN looked at orders, per [facility], melatonin was given yesterday (10/5) at 1711. This RN relayed this to pt, stating no other orders were available, pt replied that's a lie. Forget about it, go back to your attitude.

## 2018-02-20 NOTE — Unmapped (Signed)
Patient rounds completed. The following patient needs were addressed:  Pain, Toileting, Plan of Care and Bed Position Low . Pt resting in stretcher. NAD noted. VSS. CCTV monitoring, Q102min checks, bed lowered to ground, side rails up and safety maintained. RN will continue to monitor Pt. No sharp items noted. Environmental check completed. Pt room safe and appropriate. Q29min checks being completed by CST/PNA on treatment floor, CST/PNA continuous monitoring CCTV, and RN monitoring completed. Safety maintained.  RN report to AM RN.  Zero visitors at this time.    Pt slept for four hours throughout night. Pt stated she felt she had too much on her mind and is unsure that she will have anything to return to once she leaves hospital/rehab. Pt is upset that she is not able to get her belongings from home as her ex will not bring her belongings to unit. Pt explained that she is concerned about her life and the decisions regarding her own health and is focused on whether or not she may have full blown AIDS as she has been off her meds. RN explained that labs will not result for 24 hours, pt demonstrated understanding. NAD noted. Pt was cooperative w/ RN, talkative and appreciative of care. Pt admitted to passive SI, w/o plan. Pt denied HI, AVH. RN will continue to montior.

## 2018-02-20 NOTE — Unmapped (Signed)
Patient rounds completed. The following patient needs were addressed:  Pain, Toileting, Plan of Care and Bed Position Low . Pt resting in stretcher. NAD noted. VSS. CCTV monitoring, Q36min checks, bed lowered to ground, side rails up and safety maintained. RN will continue to monitor Pt. No sharp items noted. Environmental check completed. Pt room safe and appropriate. Q3min checks being completed by CST/PNA on treatment floor, CST/PNA continuous monitoring CCTV, and RN monitoring completed. Safety maintained.  RN report from [employer].  Zero visitors at this time.

## 2018-02-21 LAB — LYMPH MARKER LIMITED,FLOW
ABSOLUTE CD4 CNT: 248 {cells}/uL — ABNORMAL LOW (ref 510–2320)
CD3% (T CELLS)": 82 % (ref 61–86)
CD4:CD8 RATIO: 0.1 — ABNORMAL LOW (ref 0.9–4.8)
CD8% T SUPPRESR": 73 % — ABNORMAL HIGH (ref 12–38)

## 2018-02-21 LAB — HIV RNA LOG(10): Lab: 3.81 — ABNORMAL HIGH

## 2018-02-21 LAB — HIV RNA, QUANTITATIVE, PCR: HIV RNA LOG(10): 3.81 {Log_copies}/mL — ABNORMAL HIGH (ref <0.00)

## 2018-02-21 LAB — SYPHILIS RPR SCREEN: Reagin Ab:PrThr:Pt:Ser:Ord:RPR: NONREACTIVE

## 2018-02-21 LAB — HIV RESULT INTERPRETATION

## 2018-02-21 LAB — HIV SUPPLEMENTAL

## 2018-02-21 LAB — CD8% T SUPPRESR": Lab: 73 — ABNORMAL HIGH

## 2018-02-21 NOTE — Unmapped (Signed)
Pt. Labile at times. Pt. Feeling hopeless but denies having thoughts of SI during the day. Pt.very open with feelings and understand she needs help with rehab but wanting to go to [facility], but not sure if she can go. No other voice concerns. Will continue to monitor.

## 2018-02-21 NOTE — Unmapped (Signed)
Pt on the phone at this time 

## 2018-02-21 NOTE — Unmapped (Signed)
Report given to [facility]. Patient care transferred at this time.

## 2018-02-21 NOTE — Unmapped (Signed)
Pt.woke up c/o pain in her right hip. MD. Paged and informed. Tylenol 650mg  ordered and given to pt.
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
## 2018-02-21 NOTE — Unmapped (Signed)
Pt.observed standing near the nursing station. Pt.is labile. Pt.is able to express feelings freely. States I know I have a problem and I need help. I just don't want to be tossedjust any where. Pt.continued to verbalize that she needed more than drug rehab but also psychiatric help. Pt.reassured that the case managers and social workers are trying to find an appropriate place for her. Pt.is very verbal. She is pleasant and appreciative of care. Compliant with meds and unit routine. Denies c/o of pain or discomfort at this time. Pysch/safety precautions in place. Will continue to monitor and offer pt.support.

## 2018-02-21 NOTE — Unmapped (Signed)
Pt.transferred to team D. Left unit accompanied by security,NA and Nurse.

## 2018-02-21 NOTE — Unmapped (Signed)
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 She reports that she needs structure and someone to be accountable to in order to stay well. Ryan Robinson expressed an interest in [facility] despite stating that it is a difficult place 'for people like me'  Ryan Robinson could benefit from skilled occupational therapy services to support positive occupational engagement through developing emotional and behavioral regulation, self-accountability and improved sleep and wake routines.    Today's Interventions: Education - Patient    PLAN OF CARE  1x per day, for: 2-3x week while in acute setting for 2 weeks    OT Post Acute Discharge Recommendations: OT services not indicated  Additional Discharge Recommendations: Outpatient Mental Health Therapy   OT DME Recommendations: None  Planned Interventions: Life Skill Training;Community Reintegration;Emotion regulation Skills;Sensory based strategies;Education - Patient;Functional cognition;Health management training;Relapse prevention      Patient/Family Goals:   Ryan Robinson wants to get back on her meds and get clean     Short Term Goals:   By 03/07/18 Ryan Robinson will identify 4 relapse prevention strategies with minimal cues for improved participation in life roles.   Time Frame : 2 weeks   By 03/07/18 Ryan Robinson will develop 3 strategies for holding herself accountable to improve her health maintenance   Time Frame : 2 weeks   By 03/07/18 Ryan Robinson will ID 2 sleep strategies to improve sleep hygiene. Time Frame : 2 weeks                     Long Term Goal #1: By date of discharge, Ryan Robinson will independently demonstrate 2 health management skills daily for improved ability to manage daily routines and life roles upon discharge.      Planned Interventions:  Life Skill Training;Community Reintegration;Emotion regulation Skills;Sensory based strategies;Education - Patient;Functional cognition;Health management training;Relapse prevention    Prognosis: Fair   Positive Indicators: Medication compliance;Outpatient follow-up;Values interests;Spirituality;Positive communication skills;Appropriate hygiene  Barriers: Difficulty modulating behavior;Frequent hospitalizations;Hx of substance abuse    SUBJECTIVE  Patient / Caregiver reports: being left alone is not good for me. I need structure  Communication Preference: Verbal     Equipment / Environment: None    Current Facility-Administered Medications   Medication Dose Route Frequency Provider Last Rate Last Dose   ??? albuterol (PROVENTIL HFA;VENTOLIN HFA) 90 mcg/actuation inhaler 2 puff  2 puff Inhalation Q4H PRN Dossie Der, MD       ??? buprenorphine-naloxone (SUBOXONE) 2-0.5 mg SL film 4 mg  4 mg Sublingual Mon-Fri Santa Genera, NP       ??? buprenorphine-naloxone (SUBOXONE) 8-2 mg SL film 8 mg  8 mg Sublingual BID Santa Genera, NP       ??? buPROPion (WELLBUTRIN XL) 24 hr tablet 450 mg  450 mg Oral Daily Charlene Brooke, PMHNP   450 mg at 02/21/18 0958   ??? dolutegravir (TIVICAY) TABLET Tab 50 mg  50 mg Oral Daily Dossie Der, MD   50 mg at 02/21/18 8119   ??? emtricitabine-tenofovir alafen (Descovy) 200-25 mg tablet 1 tablet  1 tablet Oral Daily Dossie Der, MD   1 tablet at 02/21/18 [phone]   ??? gabapentin (NEURONTIN) capsule 1,200 mg  1,200 mg Oral TID Dossie Der, MD   1,200 mg at 02/21/18 9604   ??? hydroCHLOROthiazide (HYDRODIURIL) tablet 25 mg  25 mg Oral Daily Dossie Der, MD   25 mg at 02/21/18 5409   ??? ibuprofen (ADVIL,MOTRIN) tablet 600 mg  600 mg Oral Q6H PRN Santa Genera, NP   600 mg at 02/21/18 1216   ??? medroxyPROGESTERone (PROVERA) tablet 10 mg  10 mg Oral Daily Dossie Der, MD   10 mg at 02/21/18 8119   ??? melatonin tablet 6 mg  6 mg Oral Nightly PRN Santa Genera, NP   6 mg at 02/20/18 2057   ??? nicotine (NICODERM CQ) 21 mg/24 hr patch 1 patch  1 patch Transdermal Daily PRN Santa Genera, NP   1 patch at 02/21/18 0900   ??? nicotine polacrilex (NICORETTE) gum 4 mg  4 mg Buccal Q4H PRN Santa Genera, NP   4 mg at 02/21/18 0900   ??? spironolactone (ALDACTONE) tablet 100 mg  100 mg Oral Daily Dossie Der, MD   100 mg at 02/21/18 1478   ??? tamsulosin (FLOMAX) 24 hr capsule 0.4 mg  0.4 mg Oral Nightly Dossie Der, MD   0.4 mg at 02/20/18 2057     Current Outpatient Medications   Medication Sig Dispense Refill   ??? albuterol (PROVENTIL HFA;VENTOLIN HFA) 90 mcg/actuation inhaler Inhale 2 puffs every four (4) hours as needed for wheezing. 1 Inhaler 0   ??? albuterol HFA 90 mcg/actuation inhaler INHALE 2 PUFFS BY MOUTH EVERY 4 TO 6 HOURS AS NEEDED FOR WHEEZING 18 g 1   ??? albuterol HFA 90 mcg/actuation inhaler INHALE 2 PUFFS BY MOUTH EVERY 4 TO 6 HOURS AS NEEDED FOR WHEEZNG 18 g 0   ??? buprenorphine-naloxone (SUBOXONE) 2-0.5 mg Film DISSOLVE 2 FILMS UNDER THE TONGUE EVERY EVENING FOR 14 DAYS 28 each 0   ??? buprenorphine-naloxone (SUBOXONE) 4-1 mg Film sublingual film Place 1 Film (4 mg total) under the tongue every evening. for 14 days 14 Film 0   ??? buprenorphine-naloxone (SUBOXONE) 8-2 mg sublingual film DISSOLVE 1 FILM UNDER THE TONGUE TWICE DAILY FOR 14 DAYS 28 each 0   ??? buPROPion (WELLBUTRIN XL) 150 MG 24 hr tablet TAKE 3 TABLETS BY MOUTH ONCE DAILY FOR 14 DAYS 42 tablet 0   ??? buPROPion (WELLBUTRIN XL) 150 MG 24 hr tablet TAKE 3 TABLETS (450 MG TOTAL) BY MOUTH ONCE DAILY 90 tablet 0   ??? buPROPion (WELLBUTRIN XL) 150 MG 24 hr tablet TAKE 3 TABLETS (450 MG) BY MOUTH ONCE DAILY (USED FOR MOOD) 90 tablet 9   ??? buPROPion 450 mg Tb24 Take 450 mg by mouth daily. 30 tablet 0   ??? buPROPion 450 mg Tb24 Take 450 mg by mouth daily. 30 tablet 0   ??? dolutegravir (TIVICAY) 50 mg Tab TABLET TAKE 1 TABLET BY MOUTH DAILY 30 tablet 0   ??? emtricitabine-tenofovir alafen (DESCOVY) 200-25 mg tablet TAKE 1 TABLET BY MOUTH ONCE DAILY FOR 7 DAYS 7 tablet 0   ??? emtricitabine-tenofovir alafen (DESCOVY) 200-25 mg tablet TAKE 1 TABLET BY MOUTH DAILY 30 tablet 0   ??? estradiol valerate (DELESTROGEN) 10 mg/mL Oil injection Inject 10 mg into the muscle every seven (7) days.     ??? gabapentin (NEURONTIN) 300 MG capsule TAKE 4 CAPSULES (1200 MG) BY  MOUTH THREE TIMES DAILY FOR PAIN 360 capsule 9   ??? hydroCHLOROthiazide (HYDRODIURIL) 25 MG tablet TAKE 1 TABLET BY MOUTH ONCE DAILY IN THE MORNING ( USED FOR HIGH BLOOD PRESSURE ) 30 tablet 9   ??? levoFLOXacin (LEVAQUIN) 750 MG tablet TAKE 1 TABLET BY MOUTH ONCE DAILY FOR 8 DAYS 8 each 0   ??? medroxyPROGESTERone (PROVERA) 10 MG tablet TAKE 1 TABLET BY MOUTH ONCE DAILY FOR 14 DAYS 14 tablet 0   ??? melatonin 3 mg Tab TAKE 2 TABLETS BY MOUTH NIGHTLY AS NEEDED FOR SLEEP 100 each 0   ??? nicotine (NICODERM CQ) 21 mg/24 hr patch USE 1 PATCH TOPICALLY ONCE DAILY (REMOVE OLD PATCH BEFORE APPLYING NEW PATCH) 28 each 0   ??? nicotine polacrilex (NICORETTE) 2 mg gum PARK 1 IN CHEEK EVERY 4 HOURS AS NEEDED FOR SMOKING CESSATION 110 each 0   ??? promethazine (PHENERGAN) 12.5 MG tablet TAKE 1 TABLET BY MOUTH EVERY 6 HOURS AS NEEDED FOR NAUSEA 21 tablet 0   ??? spironolactone (ALDACTONE) 100 MG tablet TAKE 1 TABLET BY MOUTH TWICE DAILY FOR 14 DAYS 28 tablet 0   ??? tamsulosin (FLOMAX) 0.4 mg capsule TAKE 1 CAPSULE BY MOUTH ONCE DAILY 90 capsule 0   ??? tamsulosin (FLOMAX) 0.4 mg capsule TAKE 1 CAPSULE BY MOUTH ONCE DAILY 30 capsule 9       Past Medical History:   Diagnosis Date   ??? ADHD (attention deficit hyperactivity disorder)    ??? Alcoholism (CMS-HCC)    ??? Anorexia nervosa    ??? Anxiety    ??? Bipolar disorder (CMS-HCC)    ??? Depression    ??? Depressive disorder    ??? HIV (human immunodeficiency virus infection) (CMS-HCC)    ??? Migraines    ??? Panic disorder    ??? R/O Borderline personality disorder 12/02/2015   ??? Seizures (CMS-HCC)    ??? Substance abuse (CMS-HCC)    ??? Substance Use Treatment History       Social History     Tobacco Use   ??? Smoking status: Current Every Day Smoker     Packs/day: 1.00     Years: 20.00     Pack years: 20.00     Types: Cigarettes   ??? Smokeless tobacco: Never Used   Substance Use Topics   ??? Alcohol use: Yes     Comment: 3-4 drinks of 24oz daily     Past Surgical History:   Procedure Laterality Date   ??? HIP ARTHROPLASTY     ??? 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 Bipolar disorder Mother    ??? ADD / ADHD Mother    ??? Anxiety disorder Mother    ??? Depression Mother    ??? Drug abuse Mother    ??? Anxiety disorder Father    ??? Anxiety disorder Sister    ??? Bipolar disorder Sister    ??? Depression Sister    ??? Paranoid behavior Sister    ??? Seizures Sister    ??? Drug abuse Brother    ??? Bipolar disorder Maternal Aunt    ??? Depression Maternal Aunt    ??? Bipolar disorder Cousin    ??? Depression Cousin    ??? Drug abuse Cousin        Allergies:   Abilify [aripiprazole]; Amoxicillin; Penicillins; Toradol [ketorolac]; and Ultram [tramadol]     OBJECTIVE  General  Medical Tests / Procedures: medical chart reviewed  Pain Comments: headache, nursing aware    Risk Factors  Risk Factors: None    Precautions / Restrictions  Precautions: Non-applicable  Weight Bearing Status: Non-applicable  Required Braces or Orthoses: Non-applicable      OCCUPATIONAL PROFILE       Occupational Profile Summary: Ryan Robinson reports that she needs a structured environment and someone to be accountable to. Having broken up and then not having access to her medications left her on a 'downward spiral' and this triggered her relapse on alcohol and cocaine. Ryan Robinson stated that she is willing to go to rehab but knows that rehab will not work until she has dealt with the underlying issues. When well she enjoys movies, TV, exercising. Recently she has been going to her appointments and sometimes going to the mall.  She expresses a desire to volunteer but knows she must get better first. Additionally she enjoys learning and will research areas of interest on the internet.     ROLES  Sibling SHOPPING  Not performing routinely   ADLS  Not performing routinely CAREGIVING Not Responsible to complete   HOMEMAKING Not performing routinely BILL PAYING and FINANCES  Needs assistance   MEAL PREP  Not performing routinely DRIVING and COMMUNITY MOBILITY  Able to use public transportation   MEDICATION MANAGEMENT  Independent         Education Level High School Not Completed  Vocation On disability  Living Environment Apartment,    Lives With Alone(recent break up - now living alone)  Leisure / Play not engaging routinely;requires additional structure  Rest and Sleep Impaired(difficulty getting to and staying asleep)    CLIENT FACTORS     Spiritual Beliefs : Reports that she prays to God every day  Cognition: WFL  Sensory Functions: appear intact  Body Structures: intact    PARTICIPATION Active participation THOUGHT PROCESSES Coherent   EYE CONTACT Good;Brief;Other(Kept her eyes closed for much of the assessment ) HALLUCINATION  Does not appear to be responding to internal stimuli   MOOD Calm DELUSIONS None noted   SPEECH  Understandable SAFETY JUDGEMENT Acknowledgement of mental health diagnosis;Understanding of treatment options         PERFORMANCE SKILLS     MOTOR SKILLS appears to be [facility] SOCIAL INTERACTION SKILLS cooperative   PROCESS SKILLS impaired;impaired decision making;impaired organization EMOTION REGULATION SKILLS impaired;Impulsive         PERFORMANCE PATTERNS     Habits - Useful: attending appointments, going to the mall  Habits - Impoverished: sobriety, self regulation and accountablility  Habits - Dominating: substance abuse,   Routines - Satisfying: does not currently  have satisfying routines  Routines - Damaging: substance use, difficulty maintaining a healthy routine and caring for self when living alone      I attest that I have reviewed the above information.  Signed: Marcial Pacas, PhD, OTR/L  Filed 02/21/2018

## 2018-02-21 NOTE — Unmapped (Signed)
Report given to [facility].  Patient care transferred at this time.
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
## 2018-02-22 LAB — DIAGNOSTIC HIV AG/AB: HIV ANTIGEN/ANTIBODY COMBO: POSITIVE — AB

## 2018-02-22 LAB — HIV ANTIGEN/ANTIBODY COMBO: HIV 1+2 Ab+HIV1 p24 Ag:PrThr:Pt:Ser/Plas:Ord:IA: POSITIVE — AB

## 2018-02-22 MED ORDER — HYDROCHLOROTHIAZIDE 25 MG TABLET
ORAL_TABLET | 9 refills | 0 days
Start: 2018-02-22 — End: 2019-03-16

## 2018-02-22 MED ORDER — GABAPENTIN 300 MG CAPSULE
ORAL_CAPSULE | 9 refills | 0 days
Start: 2018-02-22 — End: 2019-03-16

## 2018-02-22 NOTE — Unmapped (Signed)
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 0.2 mg/dL 0.2 mg/dL, 1.0 mg/dL    Bilirubin, UA Negative Negative    Blood, UA Negative Negative    RBC, UA 3 <=3 /HPF    WBC, UA 25 (H) <=2 /HPF    Squam Epithel, UA 1 0 - 5 /HPF    Bacteria, UA Few (A) None Seen /HPF    Mucus, UA Rare (A) None Seen /HPF   LYMPH MARKER LIMITED,FLOW   Result Value Ref Range    CD3% (T Cells) 82 61 - 86 %    Absolute CD3 Count 3,383 915-3,400 /uL    CD4% (T Helper) 6 (L) 34 - 58 %    Absolute CD4 Count 248 (L) 510-2,320 /uL    CD8% T Suppressor 73 (H) 12 - 38 %    Absolute CD8 Count 3,011 (H) 180-1,520 /uL    CD4:CD8 Ratio 0.1 (L) 0.9 - 4.8   HIV Supplemental   Result Value Ref Range    HIV AB Supplemental Interpretation Reactive for HIV-1 antibodies (A) Nonreactive    HIV Result Interpretation See results in comment field    ECG 12 lead (Adult)   Result Value Ref Range    EKG Systolic BP  mmHg    EKG Diastolic BP  mmHg    EKG Ventricular Rate 84 BPM    EKG Atrial Rate 84 BPM    EKG P-R Interval 148 ms    EKG QRS Duration 88 ms    EKG Q-T Interval 396 ms    EKG QTC Calculation 467 ms    EKG Calculated P Axis 66 degrees    EKG Calculated R Axis 28 degrees    EKG Calculated T Axis 48 degrees    QTC Fredericia 442 ms   CBC w/ Differential   Result Value Ref Range    WBC 7.8 4.5 - 11.0 10*9/L    RBC 4.88 4.50 - 5.20 10*12/L    HGB 15.6 13.5 - 16.0 g/dL    HCT 04.5 40.9 - 81.1 %    MCV 93.5 80.0 - 100.0 fL    MCH 32.0 26.0 - 34.0 pg    MCHC 34.2 31.0 - 37.0 g/dL    RDW 91.4 78.2 - 95.6 %    MPV 7.3 7.0 - 10.0 fL    Platelet 311 150 - 440 10*9/L    Neutrophils % 52.8 %    Lymphocytes % 37.0 %    Monocytes % 6.6 %    Eosinophils % 0.3 %    Basophils % 1.3 %    Absolute Neutrophils 4.1 2.0 - 7.5 10*9/L    Absolute Lymphocytes 2.9 1.5 - 5.0 10*9/L    Absolute Monocytes 0.5 0.2 - 0.8 10*9/L    Absolute Eosinophils 0.0 0.0 - 0.4 10*9/L    Absolute Basophils 0.1 0.0 - 0.1 10*9/L    Large Unstained Cells 2 0 - 4 %     Imaging: None    Psychometrics:  To be completed per unit protocol

## 2018-02-22 NOTE — Unmapped (Signed)
Peer Support Specialist Brief Intervention    Patient Information:  Name: Ryan Robinson  MRN: [id number]  Age: 42 y.o.  Chief Complaint   Patient presents with   ??? Suicidal     Patient Active Problem List   Diagnosis   ??? HIV (human immunodeficiency virus infection) (CMS-HCC)   ??? Major depression, recurrent, severe without psychosis   ??? Substance induced mood disorder (CMS-HCC)   ??? Alcohol use disorder, moderate, dependence (CMS-HCC)   ??? Essential hypertension (RAF-HCC)   ??? R/O Borderline personality disorder   ??? Urethral stricture   ??? 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 Cigarette nicotine dependence without complication   ??? Bipolar disorder in partial remission (CMS-HCC)   ??? Substance-induced depressive disorder (CMS-HCC)   ??? Cocaine use disorder, moderate, dependence (CMS-HCC)   ??? Cocaine abuse (CMS-HCC)   ??? Acute cystitis   ??? 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  albuterol (PROVENTIL HFA;VENTOLIN HFA) 90 mcg/actuation inhaler 2 puff, 2 puff, Inhalation, Q4H PRN, Dossie Der, MD  ???  buprenorphine-naloxone (SUBOXONE) 2-0.5 mg SL film 4 mg, 4 mg, Sublingual, Mon-Fri, [name] Fredricka Bonine, NP, 4 mg at 02/21/18 1734  ???  buprenorphine-naloxone (SUBOXONE) 8-2 mg SL film 8 mg, 8 mg, Sublingual, BID, Santa Genera, NP, 8 mg at 02/22/18 0553  ???  buPROPion (WELLBUTRIN XL) 24 hr tablet 450 mg, 450 mg, Oral, Daily, Charlene Brooke, PMHNP, 450 mg at 02/22/18 0850  ???  dolutegravir (TIVICAY) TABLET Tab 50 mg, 50 mg, Oral, Daily, Dossie Der, MD, 50 mg at 02/22/18 0851  ???  emtricitabine-tenofovir alafen (Descovy) 200-25 mg tablet 1 tablet, 1 tablet, Oral, Daily, Dossie Der, MD, 1 tablet at 02/22/18 0851  ???  gabapentin (NEURONTIN) capsule 1,200 mg, 1,200 mg, Oral, TID, Dossie Der, MD, 1,200 mg at 02/22/18 0849  ???  hydroCHLOROthiazide (HYDRODIURIL) tablet 25 mg, 25 mg, Oral, Daily, Dossie Der, MD, 25 mg at 02/22/18 0853  ???  ibuprofen (ADVIL,MOTRIN) tablet 600 mg, 600 mg, Oral, Q6H PRN, Santa Genera, NP, 600 mg at 02/21/18 1216  ???  medroxyPROGESTERone (PROVERA) tablet 10 mg, 10 mg, Oral, Daily, Dossie Der, MD, 10 mg at 02/22/18 0851  ???  melatonin tablet 6 mg, 6 mg, Oral, Nightly PRN, Santa Genera, NP, 6 mg at 02/21/18 2113  ???  nicotine (NICODERM CQ) 21 mg/24 hr patch 1 patch, 1 patch, Transdermal, Daily PRN, Santa Genera, NP, Stopped at 02/22/18 0845  ???  nicotine polacrilex (NICORETTE) gum 4 mg, 4 mg, Buccal, Q4H PRN, Santa Genera, NP, 4 mg at 02/21/18 1547  ???  spironolactone (ALDACTONE) tablet 100 mg, 100 mg, Oral, Daily, Dossie Der, MD, 100 mg at 02/22/18 0849  ???  tamsulosin (FLOMAX) 24 hr capsule 0.4 mg, 0.4 mg, Oral, Nightly, Dossie Der, MD, 0.4 mg at 02/21/18 2041    Current Outpatient Medications:   ???  albuterol (PROVENTIL HFA;VENTOLIN HFA) 90 mcg/actuation inhaler, Inhale 2 puffs every four (4) hours as needed for wheezing., Disp: 1 Inhaler, Rfl: 0  ???  albuterol HFA 90 mcg/actuation inhaler, INHALE 2 PUFFS BY MOUTH EVERY 4 TO 6 HOURS AS NEEDED FOR WHEEZING, Disp: 18 g, Rfl: 1  ???  albuterol HFA 90 mcg/actuation inhaler, INHALE 2 PUFFS BY MOUTH EVERY 4 TO 6 HOURS AS NEEDED FOR WHEEZNG, Disp: 18 g, Rfl: 0  ???  buprenorphine-naloxone (SUBOXONE) 2-0.5  mg Film, DISSOLVE 2 FILMS UNDER THE TONGUE EVERY EVENING FOR 14 DAYS, Disp: 28 each, Rfl: 0  ???  buprenorphine-naloxone (SUBOXONE) 4-1 mg Film sublingual film, Place 1 Film (4 mg total) under the tongue every evening. for 14 days, Disp: 14 Film, Rfl: 0  ???  buprenorphine-naloxone (SUBOXONE) 8-2 mg sublingual film, DISSOLVE 1 FILM UNDER THE TONGUE TWICE DAILY FOR 14 DAYS, Disp: 28 each, Rfl: 0  ???  buPROPion (WELLBUTRIN XL) 150 MG 24 hr tablet, TAKE 3 TABLETS BY MOUTH ONCE DAILY FOR 14 DAYS, Disp: 42 tablet, Rfl: 0  ???  buPROPion (WELLBUTRIN XL) 150 MG 24 hr tablet, TAKE 3 TABLETS (450 MG TOTAL) BY MOUTH ONCE DAILY, Disp: 90 tablet, Rfl: 0  ??? buPROPion (WELLBUTRIN XL) 150 MG 24 hr tablet, TAKE 3 TABLETS (450 MG) BY MOUTH ONCE DAILY (USED FOR MOOD), Disp: 90 tablet, Rfl: 9  ???  buPROPion 450 mg Tb24, Take 450 mg by mouth daily., Disp: 30 tablet, Rfl: 0  ???  buPROPion 450 mg Tb24, Take 450 mg by mouth daily., Disp: 30 tablet, Rfl: 0  ???  dolutegravir (TIVICAY) 50 mg Tab TABLET, TAKE 1 TABLET BY MOUTH DAILY, Disp: 30 tablet, Rfl: 0  ???  emtricitabine-tenofovir alafen (DESCOVY) 200-25 mg tablet, TAKE 1 TABLET BY MOUTH ONCE DAILY FOR 7 DAYS, Disp: 7 tablet, Rfl: 0  ???  emtricitabine-tenofovir alafen (DESCOVY) 200-25 mg tablet, TAKE 1 TABLET BY MOUTH DAILY, Disp: 30 tablet, Rfl: 0  ???  estradiol valerate (DELESTROGEN) 10 mg/mL Oil injection, Inject 10 mg into the muscle every seven (7) days., Disp: , Rfl:   ???  gabapentin (NEURONTIN) 300 MG capsule, TAKE 4 CAPSULES (1200 MG) BY MOUTH THREE TIMES DAILY FOR PAIN, Disp: 360 capsule, Rfl: 9  ???  hydroCHLOROthiazide (HYDRODIURIL) 25 MG tablet, TAKE 1 TABLET BY MOUTH ONCE DAILY IN THE MORNING ( USED FOR HIGH BLOOD PRESSURE ), Disp: 30 tablet, Rfl: 9  ???  levoFLOXacin (LEVAQUIN) 750 MG tablet, TAKE 1 TABLET BY MOUTH ONCE DAILY FOR 8 DAYS, Disp: 8 each, Rfl: 0  ???  medroxyPROGESTERone (PROVERA) 10 MG tablet, TAKE 1 TABLET BY MOUTH ONCE DAILY FOR 14 DAYS, Disp: 14 tablet, Rfl: 0  ???  melatonin 3 mg Tab, TAKE 2 TABLETS BY MOUTH NIGHTLY AS NEEDED FOR SLEEP, Disp: 100 each, Rfl: 0  ???  nicotine (NICODERM CQ) 21 mg/24 hr patch, USE 1 PATCH TOPICALLY ONCE DAILY (REMOVE OLD PATCH BEFORE APPLYING NEW PATCH), Disp: 28 each, Rfl: 0  ???  nicotine polacrilex (NICORETTE) 2 mg gum, PARK 1 IN CHEEK EVERY 4 HOURS AS NEEDED FOR SMOKING CESSATION, Disp: 110 each, Rfl: 0  ???  promethazine (PHENERGAN) 12.5 MG tablet, TAKE 1 TABLET BY MOUTH EVERY 6 HOURS AS NEEDED FOR NAUSEA, Disp: 21 tablet, Rfl: 0  ???  spironolactone (ALDACTONE) 100 MG tablet, TAKE 1 TABLET BY MOUTH TWICE DAILY FOR 14 DAYS, Disp: 28 tablet, Rfl: 0  ???  tamsulosin (FLOMAX) 0.4 mg capsule, TAKE 1 CAPSULE BY MOUTH ONCE DAILY, Disp: 90 capsule, Rfl: 0  ???  tamsulosin (FLOMAX) 0.4 mg capsule, TAKE 1 CAPSULE BY MOUTH ONCE DAILY, Disp: 30 capsule, Rfl: 9      Reason for Consult: substance use    Primary Substance Use Disorder: Cocaine/Crack    Secondary Substance Use Disorder(s): Alcohol    This patient's Change assessment indicates that they are in the Planning stage.    Pre-Contemplation   Contemplation Planning Action Maintenance   Not thinking about or has rejected change.  Thinking and talking about change.  Seeks out support. Planning what it would take to make change happen. Taking positive steps by putting the plan into practice. Achieving positive and concrete developments with continuing and potentially little support.       Does the patient desire treatment for substance use disorder? Yes, Desires treatment    - If YES, what type of treatment does the patient want? Residential    Is the patient interested in Medication Assisted Treatment for Alcohol and or Opioid Use Disorder? No, Not interested in MAT    Resources provided to the patient:  AA/NA Info and Other (Comment)      Notes:   PSS met with the pt to discuss the [facility] Recovery Process:     ? H ealth???overcoming or managing one's disease(s) or symptoms and making informed, healthy choices that support physical, mental and spiritual well-being.  ? E nvironment???having a stable and safe place to live and a supportive, encouraging and recovery minded support system.  ? A spirations???conducting meaningful daily activities that reflect one's purpose and passion and having the independence, income, and resources to participate in society.  ? L Ife???having relationships and social networks that provide support, friendship, love, and hope.  Living and sharing life with others.  ? S ervice???experience the joy of giving back to society by using one's recovery story, habits and hurts to assist others reach their full life potential. Patient identifies the following obstacles and/or goals in their personal recovery plan:    ? H ealth - not been taking medication regularly  ? E nvironment - lacks stable, safe housing; interested in finding a long-term SUD facility  ? A spirations - hopes to be an advocate for transgender persons  ? L Ife - lacks safe, sober supports. Has done well in 12-step circles before  ? S ervice - hopes to be an advocate for transgender persons    Patient states she would be interested in 30-60 day program for substance use treatment. Patient anxious about going far from [facility], but is open to exploring her options.

## 2018-02-22 NOTE — Unmapped (Signed)
Pt compliant with HS medication regimen, pt is very social, animated, boisterous, pt denies si/hi when asked, will continue to monitor for safety.

## 2018-02-22 NOTE — Unmapped (Signed)
Pt pleasant calm and cooperative. transferring back to [facility], called and gave report to Diane RN.

## 2018-02-22 NOTE — Unmapped (Signed)
Pt has arrived to unit.  Assumed care of patient at this time.  Pt appears to be calm and in NAD.

## 2018-02-22 NOTE — Unmapped (Signed)
ED Progress Note    February 22, 2018 12:08 PM    HIV RNA 6,420, CD4 248, no evidence of opportunistic infection. Prophylaxis not indicated at this time. STI testing pending.     Pt denies any complaints or concerns at this time.

## 2018-02-22 NOTE — Unmapped (Signed)
Report given to [facility]. Patient care transferred at this time.

## 2018-02-22 NOTE — Unmapped (Signed)
Patient currently in the hospital. Pushing call out a week, or until discharged.

## 2018-02-22 NOTE — Unmapped (Addendum)
ED Course: Patient presented to the ED on 02/18/2018.  They presented as a voluntarily patient. On admission to psychiatry, patient will be Voluntary. Symptoms on presentation included SI, depression, hopelessness. During the patient's time in the ED, symptoms have Improved: Improved. Total length of time in the ED was: 95 hrs    The following interventions took place during the patient's ED stay:    Psychopharmacological Interventions:   --The following psychiatric conditions were managed by continuation of home medications:    ?? Continue buprenorphine-naloxone (SUBOXONE) 2-0.5 mg SL film 4 mg  ?? buprenorphine-naloxone (SUBOXONE) 8-2 mg SL film 8 mg  ?? Wellbutrin XL 450mg  Daily  ?? melatonin tablet 6 mg    --Forced Medications were: were or were not required.   --Medications used for EFMs included: None  --Medications used for EFMs not applicable effective.     Behavioral interventions:   --Number of Restraints: 0  --Number of Therapeutic Holds: 0  --Number of Seclusions: 0                    General Medical Interventions:   --The following medical conditions were managed by continuation of home medications:   ?? albuterol (PROVENTIL HFA;VENTOLIN HFA) 90 mcg/actuation inhaler 2 puff  ?? dolutegravir (TIVICAY) TABLET Tab 50 mg  ?? emtricitabine-tenofovir alafen (Descovy) 200-25 mg tablet 1 tablet  ?? gabapentin (NEURONTIN) capsule 1,200 mg  ?? hydroCHLOROthiazide (HYDRODIURIL) tablet 25 mg  ?? ibuprofen (ADVIL,MOTRIN) tablet 600 mg  ?? medroxyPROGESTERone (PROVERA) tablet 10 mg  ?? melatonin tablet 6 mg  ?? tamsulosin (FLOMAX) 24 hr capsule 0.4 mg    This Clinical research associate met with pt and informed of plan to be admitted to in-pt unit. Continues to request voluntary admission for psychiatric stabilization. Eager to be admitted today.
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
## 2018-02-22 NOTE — Unmapped (Signed)
Pt received breakfast meal tray

## 2018-02-22 NOTE — Unmapped (Addendum)
cALLED REPORT TO [facility] RN.

## 2018-02-22 NOTE — Unmapped (Signed)
Received report from Diane RN, pt awake on the unit, walking in the hallway,  no distress noted, will continue to monitor for safety.

## 2018-02-22 NOTE — Unmapped (Signed)
Bed: G106  Expected date: 02/21/18  Expected time: 4:57 PM  Means of arrival:   Comments:  70D

## 2018-02-22 NOTE — Unmapped (Signed)
Patient provided with clean linen and hygiene products. Pt supervised shaving with no incidents. Pt in bathroom taking shower now.

## 2018-02-23 NOTE — Unmapped (Signed)
Pt has been compliant with unit routines. She attended some groups today. She has been interacting well with other patients. She is compliant with taking all medications. She continues to endorse SI without any plan, and she contracts for safety. No unsafe behaviors noted. She complained of a headache and received ibuprofen with relief. She has also received prn nicorette gum for craving with good relief. Staff continue to monitor and offer support.   Problem: Suicide Risk  Goal: Absence of Self-Harm  Outcome: Progressing     Problem: Adult Behavioral Health Plan of Care  Goal: Plan of Care Review  Outcome: Progressing  Goal: Patient-Specific Goal (Individualization)  Outcome: Progressing  Goal: Adheres to Safety Considerations for Self and Others  Outcome: Progressing  Goal: Optimized Coping Skills in Response to Life Stressors  Outcome: Progressing  Goal: Develops/Participates in Therapeutic Alliance to Support Successful Transition  Outcome: Progressing  Goal: Rounds/Family Conference  Outcome: Progressing
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
## 2018-02-23 NOTE — Unmapped (Signed)
Verbal order completed: static/dynamic stretching

## 2018-02-23 NOTE — Unmapped (Signed)
Pt already on OT caseload.  See evaluation completed by Marcial Pacas, OT on 02/21/18 with PMHNP orders attached from Charlene Brooke.  Thanks!    I attest that I have reviewed the above information.  Signed: Conception Oms, OT  Filed 02/23/2018

## 2018-02-23 NOTE — Unmapped (Signed)
Recreational Therapy Evaluation  02/23/2018     Patient Name:  Ryan Robinson       Medical Record Number: [id number]   Date of Birth: December 01, 1975  Sex: Adult          Room/Bed:  4121/4121-01    Eval Duration: 10 Min.    Assessment  Patient oriented to inpatient unit and LRT reviewed group times and expectations. LRT completed patient's PAR-Q. Patient's current treatment plan and goals remain appropriate.    Plan of Care  1x per day for: 2-3x week   Planned Treatment Duration 2 weeks             Goals:  1. By 02/28/18, patient will demonstrate 3 healthy coping strategies during Recreational Therapy intervention sessions, with fewer than 2 cues, demonstrating improved ability to manage emotional distress.    2. By 03/07/18, patient will identify a plan to independently implement two new coping strategy or leisure interest, demonstrating improved ability to maintain safety outside the hospital setting.              Subjective         Past Medical History:   Diagnosis Date   ??? ADHD (attention deficit hyperactivity disorder)    ??? Alcoholism (CMS-HCC)    ??? Anorexia nervosa    ??? Anxiety    ??? Bipolar disorder (CMS-HCC)    ??? Cocaine use disorder, moderate, dependence (CMS-HCC) 08/08/2017   ??? Depression    ??? Depressive disorder    ??? HIV (human immunodeficiency virus infection) (CMS-HCC)    ??? Migraines    ??? Panic disorder    ??? R/O Borderline personality disorder 12/02/2015   ??? Seizures (CMS-HCC)    ??? Substance abuse (CMS-HCC)    ??? Substance Use Treatment History     Social History     Tobacco Use   ??? Smoking status: Current Every Day Smoker     Packs/day: 1.00     Years: 20.00     Pack years: 20.00     Types: Cigarettes   ??? Smokeless tobacco: Never Used   Substance Use Topics   ??? Alcohol use: Yes     Comment: 3-4 drinks of 24oz daily      Past Surgical History:   Procedure Laterality Date   ??? HIP ARTHROPLASTY     ??? 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 Bipolar disorder Mother    ??? ADD / ADHD Mother    ??? Anxiety disorder Mother    ??? Depression Mother    ??? Drug abuse Mother    ??? Anxiety disorder Father    ??? Anxiety disorder Sister    ??? Bipolar disorder Sister    ??? Depression Sister    ??? Paranoid behavior Sister    ??? Seizures Sister    ??? Drug abuse Brother    ??? Bipolar disorder Maternal Aunt    ??? Depression Maternal Aunt    ??? Bipolar disorder Cousin    ??? Depression Cousin    ??? Drug abuse Cousin         Allergies: Abilify [aripiprazole]; Amoxicillin; Penicillins; Toradol [ketorolac]; and Ultram [tramadol]     Objective                   Physical Domain  Exercise readiness comment: PAR-Q completed, with patient noting history of back and hip  surgeries.  Additional Physical Domain comments: Patient with HIV, HTN (on medications), and hormone replacement therapy                 I attest that I have reviewed the above information.  Signed by Herbert Seta  Filed 02/23/2018
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
## 2018-02-23 NOTE — Unmapped (Signed)
Duplicate order, please see evaluation completed 02/23/18.
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
## 2018-02-23 NOTE — Unmapped (Signed)
Patient endorses feeling depressed and suicidal. States that if she was outside she would overdose on opiates. That she didn't take her medication as she should, that she would only take it some time and that just everything over the last 2 months have lead to her feeling this way. She is medication compliant. She denies HI and hallucinations. Will continue to monitor.      Problem: Suicide Risk  Goal: Absence of Self-Harm  Outcome: Progressing     Problem: Adult Behavioral Health Plan of Care  Goal: Plan of Care Review  Outcome: Progressing  Goal: Patient-Specific Goal (Individualization)  Outcome: Progressing  Goal: Adheres to Safety Considerations for Self and Others  Outcome: Progressing  Goal: Optimized Coping Skills in Response to Life Stressors  Outcome: Progressing  Goal: Develops/Participates in Therapeutic Alliance to Support Successful Transition  Outcome: Progressing  Goal: Rounds/Family Conference  Outcome: Progressing

## 2018-02-23 NOTE — Unmapped (Signed)
LATE ENTRY:  Pt arrived to unit A&Ox4, MAE, speech clear & coherent.  Pt calm & cooperative on arrival, agreeable to RN admission assessment.  Pt oriented to unit, room & dietary.  Pt skin clean, dry & intact.  Pt endorses SI outside of hospital without a plan, denies wanting to harm self while inpatient.  Pt in NAD, will continue to monitor.      Problem: Suicide Risk  Goal: Absence of Self-Harm  Outcome: Progressing     Problem: Adult Behavioral Health Plan of Care  Goal: Plan of Care Review  Outcome: Progressing  Goal: Patient-Specific Goal (Individualization)  Outcome: Progressing  Goal: Adheres to Safety Considerations for Self and Others  Outcome: Progressing  Goal: Optimized Coping Skills in Response to Life Stressors  Outcome: Progressing  Goal: Develops/Participates in Therapeutic Alliance to Support Successful Transition  Outcome: Progressing  Goal: Rounds/Family Conference  Outcome: Progressing

## 2018-02-24 MED FILL — GABAPENTIN 300 MG CAPSULE: 30 days supply | Qty: 360 | Fill #0 | Status: AC

## 2018-02-24 MED FILL — HYDROCHLOROTHIAZIDE 25 MG TABLET: 30 days supply | Qty: 30 | Fill #0

## 2018-02-24 MED FILL — HYDROCHLOROTHIAZIDE 25 MG TABLET: 30 days supply | Qty: 30 | Fill #0 | Status: AC

## 2018-02-24 MED FILL — GABAPENTIN 300 MG CAPSULE: 30 days supply | Qty: 360 | Fill #0

## 2018-02-24 NOTE — Unmapped (Signed)
Patient is compliant with medication regimen and attends group. Patient states that she wants medications adjusted prior to discharge, plans to return to self care after hospitalization, denies SI/HI/AVH.      Problem: Suicide Risk  Goal: Absence of Self-Harm  Outcome: Progressing  Note:   Patient denies SI/HI/AVH      Problem: Adult Behavioral Health Plan of Care  Goal: Plan of Care Review  Outcome: Progressing  Flowsheets (Taken 02/24/2018 1532)  Progress: improving  Plan of Care Reviewed With: patient  Patient Agreement with Plan of Care: agrees  Goal: Patient-Specific Goal (Individualization)  Outcome: Progressing  Flowsheets  Taken 02/24/2018 1532 by Ellsworth Lennox, RN  Patient Personal Strengths: expressive of emotions;expressive of needs  Taken 02/23/2018 2344 by Laverta Baltimore, RN  Patient-Specific Goals (Include Timeframe): Patient reports she would like to have hope for the future and not be depressed by 02/01/18  Goal: Adheres to Safety Considerations for Self and Others  Outcome: Progressing  Goal: Optimized Coping Skills in Response to Life Stressors  Outcome: Progressing  Goal: Develops/Participates in Therapeutic Alliance to Support Successful Transition  Outcome: Progressing  Goal: Rounds/Family Conference  Outcome: Progressing  Flowsheets (Taken 02/24/2018 1532)  Participants: psychiatrist; patient; social work; nursing     Problem: Feelings of Worthlessness, Hopelessness, Excessive Guilt (Depressive Signs/Symptoms)  Goal: Enhanced Self-Esteem and Confidence (Depressive Signs/Symptoms)  Outcome: Progressing  Flowsheets (Taken 02/24/2018 1532)  Mutually Determined Action Steps (Promote Confidence and Self-Esteem): verbalizes successes     Problem: Social, 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  Flowsheets (Taken 02/24/2018 1532)  Mutually Determined Action Steps (Promote Social/Occupational/Functional Ability): identifies personal strengths

## 2018-02-24 NOTE — Unmapped (Signed)
Patient in dayroom watching TV during the shift when dayroom closed patient moved to TV room.  Patient reports that she still has thoughts of killing herself and does think about how she is going to do it, however she contracts for safety and reports that she would not do anything here.  Patient requested her medication be given at 21:30 and came to get it at that time.  Pt is alert and oriented, pleasant and cooperative, no complaints of pain, in no acute distress. Denies SI/HI & AVH, contracts for safety.     Problem: Suicide Risk  Goal: Absence of Self-Harm  Outcome: Progressing     Problem: Adult Behavioral Health Plan of Care  Goal: Plan of Care Review  Outcome: Progressing  Goal: Patient-Specific Goal (Individualization)  Outcome: Progressing  Flowsheets (Taken 02/23/2018 2344)  Patient-Specific Goals (Include Timeframe): Patient reports she would like to have hope for the future and not be depressed by 02/01/18  Goal: Adheres to Safety Considerations for Self and Others  Outcome: Progressing  Goal: Optimized Coping Skills in Response to Life Stressors  Outcome: Progressing  Goal: Develops/Participates in Therapeutic Alliance to Support Successful Transition  Outcome: Progressing  Goal: Rounds/Family Conference  Outcome: Progressing     Problem: Feelings of Worthlessness, Hopelessness, Excessive Guilt (Depressive Signs/Symptoms)  Goal: Enhanced Self-Esteem and Confidence (Depressive Signs/Symptoms)  Outcome: Progressing     Problem: Social, 

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
## 2018-02-25 NOTE — Unmapped (Signed)
Patient is complaint with medication regimen. Patient talked about finding another place to live, states that current apartment is too big and she feels lonely in the apartment since breakup with significant other. Patient denies SI/HI/AVH, plans to discharge to self care after hospitalization.      Problem: Suicide Risk  Goal: Absence of Self-Harm  Outcome: Progressing  Note:   Patient denies SI/HI/AVH.     Problem: Adult Behavioral Health Plan of Care  Goal: Plan of Care Review  Outcome: Progressing  Flowsheets (Taken 02/24/2018 1532)  Progress: improving  Plan of Care Reviewed With: patient  Patient Agreement with Plan of Care: agrees  Goal: Patient-Specific Goal (Individualization)  Outcome: Progressing  Flowsheets  Taken 02/24/2018 1532 by Ellsworth Lennox, RN  Patient Personal Strengths: expressive of emotions;expressive of needs  Taken 02/23/2018 2344 by Laverta Baltimore, RN  Patient-Specific Goals (Include Timeframe): Patient reports she would like to have hope for the future and not be depressed by 02/01/18  Goal: Adheres to Safety Considerations for Self and Others  Outcome: Progressing  Goal: Optimized Coping Skills in Response to Life Stressors  Outcome: Progressing  Goal: Develops/Participates in Therapeutic Alliance to Support Successful Transition  Outcome: Progressing  Goal: Rounds/Family Conference  Outcome: Progressing  Flowsheets (Taken 02/24/2018 1532)  Participants: psychiatrist;patient;social work;nursing     Problem: Feelings of Worthlessness, Hopelessness, Excessive Guilt (Depressive Signs/Symptoms)  Goal: Enhanced Self-Esteem and Confidence (Depressive Signs/Symptoms)  Outcome: Progressing  Flowsheets (Taken 02/24/2018 1532)  Mutually Determined Action Steps (Promote Confidence and Self-Esteem): verbalizes successes     Problem: Social, Occupational or Functional Impairment (Excessive Substance Use)  Goal: Improved Social, Occupational and Functional Skills (Excessive Substance Use) Outcome: Progressing  Flowsheets (Taken 02/24/2018 1532)  Mutually Determined Action Steps (Promote Social/Occupational/Functional Ability): identifies personal strengths

## 2018-02-26 NOTE — Unmapped (Signed)
Pt alert and oriented. Clutter free environment maintained. Pt calm and cooperative. No unsafe behavior noted Pt denies SI/HI/AVH.  PRN medication given to manage pain, anxiety.  Pt visible in mileu throughout day.  Attention seeking behavior evident.  Appropriate interaction with peers and staff. Will continue to monitor          Problem: Suicide Risk  Goal: Absence of Self-Harm  Outcome: Progressing     Problem: Adult Behavioral Health Plan of Care  Goal: Plan of Care Review  Outcome: Progressing  Goal: Patient-Specific Goal (Individualization)  Outcome: Progressing  Goal: Adheres to Safety Considerations for Self and Others  Outcome: Progressing  Goal: Optimized Coping Skills in Response to Life Stressors  Outcome: Progressing  Goal: Develops/Participates in Therapeutic Alliance to Support Successful Transition  Outcome: Progressing  Goal: Rounds/Family Conference  Outcome: Progressing     Problem: Feelings of Worthlessness, Hopelessness, Excessive Guilt (Depressive Signs/Symptoms)  Goal: Enhanced Self-Esteem and Confidence (Depressive Signs/Symptoms)  Outcome: Progressing     Problem: Social, 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

## 2018-02-26 NOTE — Unmapped (Signed)
Patient  calm and cooperative, denies current SI/HI/AVH, observed interacting with peers and staff.  Pt stated she slept better last night and less anxious.  Plan of care, monitoring continues.    Problem: Suicide Risk  Goal: Absence of Self-Harm  Outcome: Progressing     Problem: Adult Behavioral Health Plan of Care  Goal: Plan of Care Review  Outcome: Progressing  Goal: Patient-Specific Goal (Individualization)  Outcome: Progressing  Flowsheets (Taken 02/26/2018 0356 by Luella Cook, RN)  Patient-Specific Goals (Include Timeframe): be more hopeful by Tuesday (10/15)  Goal: Adheres to Safety Considerations for Self and Others  Outcome: Progressing  Goal: Optimized Coping Skills in Response to Life Stressors  Outcome: Progressing  Goal: Develops/Participates in Therapeutic Alliance to Support Successful Transition  Outcome: Progressing  Goal: Rounds/Family Conference  Outcome: Progressing     Problem: Feelings of Worthlessness, Hopelessness, Excessive Guilt (Depressive Signs/Symptoms)  Goal: Enhanced Self-Esteem and Confidence (Depressive Signs/Symptoms)  Outcome: Progressing     Problem: Social, 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

## 2018-02-26 NOTE — Unmapped (Signed)
Pt A&OX4, visible in the TV room & socializing w/peers during the evening.  Pleasant, attention seeking, and cooperative - no complaints of pain, in NAD. Denies SI/HI & AVH, verbally contracts for safety with staff.  Pt reassured and supported, monitored as ordered.  POC continued.    Problem: Suicide Risk  Goal: Absence of Self-Harm  Outcome: Progressing     Problem: Adult Behavioral Health Plan of Care  Goal: Plan of Care Review  Outcome: Progressing  Goal: Patient-Specific Goal (Individualization)  Outcome: Progressing  Flowsheets (Taken 02/26/2018 0356)  Patient-Specific Goals (Include Timeframe): be more hopeful by Tuesday (10/15)
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
## 2018-02-27 NOTE — Unmapped (Signed)
Pt A&OX4, visible in the TV room & socializing w/peers during the evening.  Denies SI/HI & AVH, verbally contracts for safety with staff.   Labile mood this evening - ranging fom talkative and pleasant to talkative and irritable.  Attempts to get [name] to re-frame frustrating exchanges with day staff - imagining ways that conversations could have been handled in a more productive manner.  Pt remains attention seeking, and largely cooperative with care - no complaints of pain, in NAD.    Pt encouraged, supported, and monitored as ordered.  POC continued.    Problem: Suicide Risk  Goal: Absence of Self-Harm  Outcome: Progressing     Problem: Adult Behavioral Health Plan of Care  Goal: Plan of Care Review  Outcome: Progressing  Goal: Patient-Specific Goal (Individualization)  Outcome: Progressing  Flowsheets (Taken 02/26/2018 0356)  Patient-Specific Goals (Include Timeframe): be more hopeful by Tuesday (10/15)  Goal: Adheres to Safety Considerations for Self and Others  Outcome: Progressing  Goal: Optimized Coping Skills in Response to Life Stressors  Outcome: Progressing  Goal: Develops/Participates in Therapeutic Alliance to Support Successful Transition  Outcome: Progressing  Goal: Rounds/Family Conference  Outcome: Progressing
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
## 2018-02-27 NOTE — Unmapped (Signed)
Patient is alert and oriented x4 on the unit.  Visible in the milieu and interacting well with peers and staff.   Pt denies SI/HI/AVH.  Patient calm and cooperative. No unsafe behaviors noted or reported during this shift.  Patient is medication complaint.  PRN medication given as needed to manage symptoms.  Plan of care, coping support and monitoring continues.   Problem: Suicide Risk  Goal: Absence of Self-Harm  Outcome: Progressing     Problem: Adult Behavioral Health Plan of Care  Goal: Plan of Care Review  Outcome: Progressing  Goal: Patient-Specific Goal (Individualization)  Outcome: Progressing  Flowsheets (Taken 02/26/2018 0356 by Luella Cook, RN)  Patient-Specific Goals (Include Timeframe): be more hopeful by Tuesday (10/15)  Goal: Adheres to Safety Considerations for Self and Others  Outcome: Progressing  Goal: Optimized Coping Skills in Response to Life Stressors  Outcome: Progressing  Goal: Develops/Participates in Therapeutic Alliance to Support Successful Transition  Outcome: Progressing  Goal: Rounds/Family Conference  Outcome: Progressing     Problem: Feelings of Worthlessness, Hopelessness, Excessive Guilt (Depressive Signs/Symptoms)  Goal: Enhanced Self-Esteem and Confidence (Depressive Signs/Symptoms)  Outcome: Progressing     Problem: Social, 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

## 2018-02-28 NOTE — Unmapped (Signed)
Pt A&OX4, visible in the TV room & socializing w/peers during the evening.  Denies SI/HI & AVH, verbally contracts for safety with staff. Pt frustrated with living situation at her apartment in [place].  Pt remains attention seeking, and cooperative with care - no complaints of pain, in NAD.    Pt encouraged, supported, and monitored as ordered.  POC continued.    Problem: Suicide Risk  Goal: Absence of Self-Harm  02/28/2018 0308 by Luella Cook, RN  Outcome: Progressing  02/28/2018 0308 by Luella Cook, RN  Outcome: Progressing     Problem: Adult Behavioral Health Plan of Care  Goal: Plan of Care Review  02/28/2018 0308 by Luella Cook, RN  Outcome: Progressing  02/28/2018 0308 by Luella Cook, RN  Outcome: Progressing  Goal: Patient-Specific Goal (Individualization)  02/28/2018 0308 by Luella Cook, RN  Outcome: Progressing  Flowsheets (Taken 02/26/2018 0356)  Patient-Specific Goals (Include Timeframe): be more hopeful by Tuesday (10/15)  02/28/2018 0308 by Luella Cook, RN  Outcome: Progressing  Goal: Adheres to Safety Considerations for Self and Others  02/28/2018 0308 by Luella Cook, RN  Outcome: Ongoing - Unchanged  02/28/2018 0308 by Luella Cook, RN  Outcome: Progressing  Goal: Optimized Coping Skills in Response to Life Stressors  02/28/2018 0308 by Luella Cook, RN  Outcome: Ongoing - Unchanged  02/28/2018 0308 by Luella Cook, RN  Outcome: Progressing  Goal: Develops/Participates in Therapeutic Alliance to Support Successful Transition  Outcome: Progressing  Goal: Rounds/Family Conference  Outcome: Progressing     Problem: Feelings of Worthlessness, Hopelessness, Excessive Guilt (Depressive Signs/Symptoms)  Goal: Enhanced Self-Esteem and Confidence (Depressive Signs/Symptoms)  02/28/2018 0308 by Luella Cook, RN  Outcome: Progressing  02/28/2018 0308 by Luella Cook, RN  Outcome: Progressing     Problem: Social, Occupational or Functional Impairment (Excessive Substance Use)  Goal: Improved Social, Occupational and Functional Skills (Excessive Substance Use)  02/28/2018 0308 by Luella Cook, RN  Outcome: Progressing  02/28/2018 0308 by Luella Cook, RN  Outcome: Progressing
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
## 2018-03-01 NOTE — Unmapped (Signed)
Pt plan of care discussed, verbalizes understanding. Talkative with staff and other patients, states she  wants to stay mindful to not worry about future, to leave past in the past, no SI, no HI.   Problem: Suicide Risk  Goal: Absence of Self-Harm  Outcome: Progressing     Problem: Adult Behavioral Health Plan of Care  Goal: Plan of Care Review  Outcome: Progressing  Goal: Patient-Specific Goal (Individualization)  Outcome: Progressing  Flowsheets (Taken 03/01/2018 1504)  Patient-Specific Goals (Include Timeframe): Pt states staying mindful to not worry about the future, and past is in the past.. continue diverting my mind by 10/16  Goal: Adheres to Safety Considerations for Self and Others  Outcome: Progressing  Goal: Optimized Coping Skills in Response to Life Stressors  Outcome: Progressing

## 2018-03-01 NOTE — Unmapped (Signed)
Patient is cooperative this shift.  Patient is intrusive with others and to staff. Other patients on the unit have told me that Ryan Robinson makes them uncomfortable and that she also told another patient on the unit that the other patient was useless and worthless and shouldn't be here. Patient has been redirected about these behaviors and her promiscuous dress around the unit. Will continue to monitor and support.      Problem: Suicide Risk  Goal: Absence of Self-Harm  Outcome: Progressing     Problem: Adult Behavioral Health Plan of Care  Goal: Plan of Care Review  Outcome: Progressing  Goal: Patient-Specific Goal (Individualization)  Outcome: Progressing  Flowsheets (Taken 02/26/2018 0356 by Luella Cook, RN)  Patient-Specific Goals (Include Timeframe): be more hopeful by Tuesday (10/15)  Goal: Adheres to Safety Considerations for Self and Others  Outcome: Progressing  Goal: Optimized Coping Skills in Response to Life Stressors  Outcome: Progressing  Goal: Develops/Participates in Therapeutic Alliance to Support Successful Transition  Outcome: Progressing  Goal: Rounds/Family Conference  Outcome: Progressing     Problem: Feelings of Worthlessness, Hopelessness, Excessive Guilt (Depressive Signs/Symptoms)  Goal: Enhanced Self-Esteem and Confidence (Depressive Signs/Symptoms)  Outcome: Progressing     Problem: Social, 

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
## 2018-03-01 NOTE — Unmapped (Signed)
Received pt in the milieu socializing and interacting with peers in the unit. She  is pleasant and cooperative. No inappropriate behavior exhibited. She is compliant with all her medications prescribed this shift. She denies SI/HI. No unsafe behavior observed. All needs attended. Will continue to monitor and provide support.  Problem: Suicide Risk  Goal: Absence of Self-Harm  Outcome: Ongoing - Unchanged     Problem: Adult Behavioral Health Plan of Care  Goal: Plan of Care Review  Outcome: Ongoing - Unchanged  Goal: Patient-Specific Goal (Individualization)  Outcome: Ongoing - Unchanged  Flowsheets (Taken 03/01/2018 0026)  Patient-Specific Goals (Include Timeframe): Pt would like to sleep atleast 6 hours by 03/04/18  Goal: Adheres to Safety Considerations for Self and Others  Outcome: Ongoing - Unchanged  Goal: Optimized Coping Skills in Response to Life Stressors  Outcome: Ongoing - Unchanged  Goal: Develops/Participates in Therapeutic Alliance to Support Successful Transition  Outcome: Ongoing - Unchanged  Goal: Rounds/Family Conference  Outcome: Ongoing - Unchanged     Problem: Feelings of Worthlessness, Hopelessness, Excessive Guilt (Depressive Signs/Symptoms)  Goal: Enhanced Self-Esteem and Confidence (Depressive Signs/Symptoms)  Outcome: Ongoing - Unchanged     Problem: Social, 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

## 2018-03-02 MED ORDER — PRAZOSIN 1 MG CAPSULE
ORAL_CAPSULE | Freq: Every evening | ORAL | 0 refills | 0 days | Status: CP
Start: 2018-03-02 — End: 2018-04-01
  Filled 2018-03-02: qty 90, 30d supply, fill #0

## 2018-03-02 MED ORDER — TAMSULOSIN 0.4 MG CAPSULE
ORAL_CAPSULE | Freq: Every evening | ORAL | 3 refills | 0 days | Status: CP
Start: 2018-03-02 — End: 2019-03-02
  Filled 2018-03-02: qty 90, 90d supply, fill #0

## 2018-03-02 MED ORDER — BUPROPION HCL XL 150 MG 24 HR TABLET, EXTENDED RELEASE
ORAL_TABLET | Freq: Every day | ORAL | 0 refills | 0.00000 days | Status: CP
Start: 2018-03-02 — End: 2018-03-25
  Filled 2018-03-02 (×2): qty 30, 30d supply, fill #0

## 2018-03-02 MED ORDER — NICOTINE 21 MG/24 HR DAILY TRANSDERMAL PATCH
MEDICATED_PATCH | Freq: Every day | TRANSDERMAL | 0 refills | 0 days | Status: CP | PRN
Start: 2018-03-02 — End: 2018-06-03
  Filled 2018-03-02: qty 28, 28d supply, fill #0
  Filled 2018-03-02: qty 110, 19d supply, fill #0

## 2018-03-02 MED ORDER — NICOTINE (POLACRILEX) 4 MG GUM
BUCCAL | 0 refills | 0 days | Status: CP | PRN
Start: 2018-03-02 — End: 2018-04-01

## 2018-03-02 MED ORDER — BUPROPION HCL XL 300 MG 24 HR TABLET, EXTENDED RELEASE
ORAL_TABLET | Freq: Every day | ORAL | 0 refills | 0 days | Status: CP
Start: 2018-03-02 — End: 2018-03-25

## 2018-03-02 MED FILL — PRAZOSIN 1 MG CAPSULE: 30 days supply | Qty: 90 | Fill #0 | Status: AC

## 2018-03-02 MED FILL — BUPROPION HCL XL 150 MG 24 HR TABLET, EXTENDED RELEASE: 30 days supply | Qty: 30 | Fill #0 | Status: AC

## 2018-03-02 MED FILL — BUPROPION HCL XL 300 MG 24 HR TABLET, EXTENDED RELEASE: 30 days supply | Qty: 30 | Fill #0 | Status: AC

## 2018-03-02 MED FILL — DESCOVY 200 MG-25 MG TABLET: 30 days supply | Qty: 30 | Fill #0 | Status: AC

## 2018-03-02 MED FILL — DESCOVY 200 MG-25 MG TABLET: ORAL | 30 days supply | Qty: 30 | Fill #0

## 2018-03-02 MED FILL — TAMSULOSIN 0.4 MG CAPSULE: 90 days supply | Qty: 90 | Fill #0 | Status: AC

## 2018-03-02 MED FILL — NICOTINE (POLACRILEX) 4 MG GUM: 19 days supply | Qty: 110 | Fill #0 | Status: AC

## 2018-03-02 MED FILL — TIVICAY 50 MG TABLET: 30 days supply | Qty: 30 | Fill #0 | Status: AC

## 2018-03-02 MED FILL — SPIRONOLACTONE 100 MG TABLET: 30 days supply | Qty: 30 | Fill #0 | Status: AC

## 2018-03-02 MED FILL — NICOTINE 21 MG/24 HR DAILY TRANSDERMAL PATCH: 28 days supply | Qty: 28 | Fill #0 | Status: AC

## 2018-03-02 NOTE — Unmapped (Signed)
[facility] Adult Abuse Consult    Pt called social worker stating that she was told today that she would be discharged today. Pt stated that she has not been able to establish or set up possible housing. Pt states that she is not sure where she is going to go but knows she needs to get her belongings from her apartment. Pt stated she had reached out to [facility] and they reported she was not appropriate for their shelter. Pt reports that CM and her MD came to discuss her discharge with her this morning however is still not sure where to go. Pt stated she had thought about going to [facility] Social worker asked pt about [facility] Pt stated she had not but was willing for social worker to look into referring her. Social worker contacted [facility] and made an appointment for pt to do a screening for their inpatient dual diagnosis program. The earliest appointment they had would be for 9pm as they do 24 hour screenings. Social worker went ahead and made appointment but was told if pt could not make that time, pt could call back and reschedule.     Social worker called pt back and explained this to pt. Social worker explained that if social wore kr was able to get pt a ride to [facility], that the concern is that if pt did not qualify for their inpatient program, she would be stuck in [place]. Pt stated that what she would do is go back to her apartment for the night and gather her belongings. Pt stated that she would call social worker back tomorrow to look into [facility] and any other resources available to her. Social worker asked pt if she felt comfortable with this plan and pt stated that she did and would be fine for the night. Social worker verbalized understanding and informed pt that Child psychotherapist would talk with her then.

## 2018-03-02 NOTE — Unmapped (Addendum)
[facility]  03-04-18 9am  [address]  [place] [address]  [phone]  Fax [phone]    Dr.  Arlester Marker  03/03/18 1:40pm  [address]  [place], [place] [address]  [phone]  Fax [phone]

## 2018-03-02 NOTE — Unmapped (Signed)
Pt plan for d/c home today, med review, d/c instructions and follow up discussed, no SI, no HI, pt verbalizes understanding. .....Marland Kitchen.  : Suicide Risk  Goal: Absence of Self-Harm  Outcome: Progressing     Problem: Adult Behavioral Health Plan of Care  Goal: Plan of Care Review  Outcome: Progressing  Goal: Patient-Specific Goal (Individualization)  Outcome: Progressing  Flowsheets (Taken 03/02/2018 1200)  Patient-Specific Goals (Include Timeframe): pt plan for discharge today, states i want to be mentally prepared for d/c today 10/16  Goal: Adheres to Safety Considerations for Self and Others  Outcome: Progressing  Goal: Optimized Coping Skills in Response to Life Stressors  Outcome: Progressing  Goal: Develops/Participates in Therapeutic Alliance to Support Successful Transition  Outcome: Progressing  Goal: Rounds/Family Conference  Outcome: Progressing     Problem: Feelings of Worthlessness, Hopelessness, Excessive Guilt (Depressive Signs/Symptoms)  Goal: Enhanced Self-Esteem and Confidence (Depressive Signs/Symptoms)  Outcome: Progressing     Problem: Social, 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

## 2018-03-02 NOTE — Unmapped (Signed)
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                Negative                  Ketones, UA                            Negative                Negative                  Urobilinogen, UA                       0.2 mg/dL               0.2 mg/dL, 1.0 mg/*       Bilirubin, UA                          Negative                Negative                  Blood, UA                              Negative                Negative                  RBC, UA                                3                       <=3 /HPF                  WBC, UA                                25 (H)                  <=2 /HPF                  Squam Epithel, UA  1                       0 - 5 /HPF                Bacteria, UA                           Few (A)                 None Seen /HPF            Mucus, UA                              Rare (A)                None Seen /HPF       -LYMPH MARKER LIMITED,FLOW       Result                                 Value                   Ref Range                 CD3% (T Cells)                         82                      61 - 86 %                 Absolute CD3 Count                     3,383                   915-3,400 /uL             CD4% (T Helper) 6 (L)                   34 - 58 %                 Absolute CD4 Count                     248 (L)                 510-2,320 /uL             CD8% T Suppressor                      73 (H)                  12 - 38 %                 Absolute CD8 Count                     3,011 (H)               180-1,520 /uL             CD4:CD8 Ratio  0.1 (L)                 0.9 - 4.8            -HIV Supplemental       Result                                 Value                   Ref Range                 HIV AB Supplemental Interpretation                             Nonreactive           Reactive for HIV-1 antibodies (A)       HIV Result Interpretation                                                            See results in comment field  -Read PPD       Result                                 Value                   Ref Range                 TB Skin Test                           Negative                                     -ECG 12 lead (Adult)       Result                                 Value                   Ref Range                 EKG Systolic BP                                                mmHg                      EKG Diastolic BP                                               mmHg                      EKG  Ventricular Rate                   84                      BPM                       EKG Atrial Rate                        84                      BPM                       EKG P-R Interval                       148                     ms                        EKG QRS Duration                       88                      ms                        EKG Q-T Interval                       396                     ms                        EKG QTC Calculation                    467                     ms                        EKG Calculated P Axis                  66                      degrees                   EKG Calculated R Axis                  28                      degrees EKG Calculated T Axis                  48                      degrees                   QTC [place]  442                     ms                   -CBC w/ Differential       Result                                 Value                   Ref Range                 WBC                                    7.8                     4.5 - 11.0 10*9/L         RBC                                    4.88                    4.50 - 5.20 10*12/L       HGB                                    15.6                    13.5 - 16.0 g/dL          HCT                                    45.6                    41.0 - 46.0 %             MCV                                    93.5                    80.0 - 100.0 fL           MCH                                    32.0                    26.0 - 34.0 pg            MCHC                                   34.2                    31.0 - 37.0 g/dL  RDW                                    12.9                    12.0 - 15.0 %             MPV                                    7.3                     7.0 - 10.0 fL             Platelet                               311                     150 - 440 10*9/L          Neutrophils %                          52.8                    %                         Lymphocytes %                          37.0                    %                         Monocytes %                            6.6                     %                         Eosinophils %                          0.3                     %                         Basophils %                            1.3                     %                         Absolute Neutrophils                   4.1  2.0 - 7.5 10*9/L          Absolute Lymphocytes                   2.9                     1.5 - 5.0 10*9/L          Absolute Monocytes                     0.5                     0.2 - 0.8 10*9/L          Absolute Eosinophils                   0.0 0.0 - 0.4 10*9/L          Absolute Basophils                     0.1                     0.0 - 0.1 10*9/L          Large Unstained Cells                  2                       0 - 4 %              Imaging: None  Pending Test Results: . To obtain pending test or study results, please contact [phone].    Advanced Directives:  Does patient have an advance directive covering medical treatment?: Patient would not like information., Patient does not have advance directive covering medical treatment.  Reason patient does not have an advance directive covering medical treatment:: Patient does not wish to complete one at this time     Reason there is not a Health Care Decision Maker appointed:: Patient does not wish to appoint a Health Care Decision Maker at this time     Information provided on advance directive:: No  Does patient have an advance directive covering mental health treatment?: Patient would not like information., Patient does not have advance directive covering mental health treatment.  Reason patient does not have an advance directive covering mental health treatment:: Patient does not wish to complete one at this time.    Discharge Unit 24-7 Contact Number: [phone] [facility]      DISCHARGE INSTRUCTIONS:  --Please continue taking medications as prescribed.     --Please refrain from using illicit substances, as these can affect your mood and could cause anxiety or other concerning symptoms.    -- Your outpatient provider will monitor your labs as indicated    --As a condition of discharge, you agree to follow-up with appropriate outpatient psychiatric providers. You have been provided prescriptions for a limited supply of your medications. All refills will need to be handled by your outpatient provider.    --Do not make changes to your medications, including taking more or less than prescribed, unless under the supervision of your physician. Be aware that some medications may make you feel worse if abruptly stopped.    --Seek further medical care for any increase in symptoms or new symptoms, such as thoughts of wanting to hurt yourself, hurt others, or if you have increased agitation.    -  For immediate concerns, call 911 for emergency medical attention.    -Emergency services are available 24 hours a day at [facility] to get assistance in deciding appropriate care during periods of psychiatric concern.             Appointments which have been scheduled for you    Mar 17, 2018  1:30 PM EDT  (Arrive by 1:00 PM)  HOSPITAL FOLLOW UP with Tamala Bari, MD  [facility] INFECTIOUS DISEASES [place] [facility] REGION) [address]  [place] [address]  [phone]       Additional instructions:      [facility]  03-04-18 9am  [address]  [place] [address]  [phone]  Fax [phone]    Dr.  Arlester Marker  03/03/18 1:40pm  [address]  [place], [place] [address]  [phone]  Fax [phone]                   Patient was seen and plan of care was discussed with the Attending MD, Dr. Martha Clan , who agrees with the above statement and plan.    I spent greater than 30 minutes in the discharge of this patient.    Rosalia Hammers, MD

## 2018-03-02 NOTE — Unmapped (Signed)
Patient watching TV in group room early in the shift.  Patient requested medications and took them without difficulty.  Patient reported that she knew that someone had reported that she had flashed them, and she reported that she would never do such a thing.  Patient is also worried about where she is going from here, reports that case manager has not given her any places to call for placement after she leaves here.  Encouraged patient to get the list tomorrow and call first thing in AM to find a place, and also encouraged her to speak with MD's about situation.  Alert and oriented, pleasant and cooperative, no complaints of pain, in no acute distress. Denies SI/HI & AVH, contracts for safety.     Problem: Suicide Risk  Goal: Absence of Self-Harm  Outcome: Progressing     Problem: Adult Behavioral Health Plan of Care  Goal: Plan of Care Review  Outcome: Progressing  Goal: Patient-Specific Goal (Individualization)  Outcome: Progressing  Flowsheets (Taken 03/01/2018 1504 by Jaye Beagle, RN)  Patient-Specific Goals (Include Timeframe): Pt states staying mindful to not worry about the future, and past is in the past.. continue diverting my mind by 10/16  Goal: Adheres to Safety Considerations for Self and Others  Outcome: Progressing  Goal: Optimized Coping Skills in Response to Life Stressors  Outcome: Progressing  Goal: Develops/Participates in Therapeutic Alliance to Support Successful Transition  Outcome: Progressing  Goal: Rounds/Family Conference  Outcome: Progressing     Problem: Feelings of Worthlessness, Hopelessness, Excessive Guilt (Depressive Signs/Symptoms)  Goal: Enhanced Self-Esteem and Confidence (Depressive Signs/Symptoms)  Outcome: Progressing     Problem: Social, 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

## 2018-03-03 MED ORDER — DOLUTEGRAVIR 50 MG TABLET
ORAL_TABLET | Freq: Every day | ORAL | 0 refills | 0 days | Status: CP
Start: 2018-03-03 — End: 2018-03-25

## 2018-03-03 MED ORDER — SPIRONOLACTONE 100 MG TABLET
ORAL_TABLET | Freq: Every day | ORAL | 0 refills | 0 days | Status: CP
Start: 2018-03-03 — End: 2018-04-02
  Filled 2018-03-02: qty 30, 30d supply, fill #0

## 2018-03-03 MED ORDER — BUPROPION HCL XL 450 MG 24 HR TABLET, EXTENDED RELEASE
ORAL_TABLET | Freq: Every day | ORAL | 0 refills | 0 days | Status: CP
Start: 2018-03-03 — End: 2018-03-02

## 2018-03-03 MED ORDER — EMTRICITABINE 200 MG-TENOFOVIR ALAFENAMIDE FUMARATE 25 MG TABLET
ORAL_TABLET | Freq: Every day | ORAL | 0 refills | 0 days | Status: CP
Start: 2018-03-03 — End: 2018-04-04

## 2018-03-08 NOTE — Unmapped (Signed)
Duration of Intervention: 5 minutes    SW attempted to call pt for care coordination but no answer, left message.     Bradly Bienenstock LCSWA, CHES

## 2018-03-16 NOTE — Unmapped (Deleted)
Assessment/Plan      Last visit w/me 11/2016    HIV  Overall ***. *** misses doses of FTC/TAF + DTG.    Not eligible yet for annual CD4 checks (earliest would be mid-2020... once stably over 300 x2Y).    Lab Results   Component Value Date    ACD4 248 (L) 02/21/2018    CD4 6 (L) 02/21/2018    HIVCP 6,420 (H) 02/18/2018    HIVRS Not Detected 08/10/2017     ?? Continue current therapy.  ?? No labs today (recently drawn).  ?? Encouraged continued excellent ARV adherence.      Gender incongruence  See note 08/2016 for full details. ***    She remains on estradiol through [facility] in [place] (Dr Arlester Marker) and is having levels monitored there. She's investigating options for breast augmentation currently thru Dr Arnoldo Lenis office @ [facility].  ?? Monitor progress over time  ?? Current HIV meds have no interactions w/any meds for transitioning      Polysubstance use/abuse  ***    Last used about 1Y ago now, per pt on 12/10/2016. Preferred drugs = cocaine (crack + powder), pain Rx, EtOH. Principally was self-medicating for hip/back pain. Down to 1/4ppd (see below). Attends NA mtgs, but not daily. Is currently on Suboxone through Dr Chrys Racer (@ [facility] venue in [facility]).  ?? Again commended her on sobriety  ?? Continue Suboxone per Dr Chrys Racer      Bipolar disorder  ***  Reports stability. No recent changes in any meds; generally feels like her mood is better after starting on hormones.  ?? Check in periodically  ?? Verify doses of meds @ next RTC      Essential HTN  BP Readings from Last 4 Encounters:   03/02/18 112/63   02/14/18 128/82   12/03/17 119/67   10/28/17 124/85     ***. No changes in antiHTN meds. Doing OK overall. No missed doses.  ?? Continue HCTZ      Nicotine dependence, uncomplicated, cigarettes  ***  Walking down slowly - currently @ 1/4ppd, down from 1/2 @ last visit. No longer using nicotine patches.  ?? Monitor progress toward cessation      Sexual health & secondary prevention  Sex with men. Monogamous w/single male partner since approx Sep 2017 (HIV-negative).   Since last visit, has had receptive anal sex and has not had add'l STI screening.   She does disclose status. Never uses condoms. (She identifies as a bottom for anal sex.)    Lab Results   Component Value Date    RPR Nonreactive 02/18/2018    CTNAA Negative 12/02/2017    CTNAA Negative 08/09/2017    CTNAA Negative 02/13/2016    GCNAA Negative 12/02/2017    GCNAA Negative 08/09/2017    GCNAA Negative 02/13/2016    SPECTYPE Urine 12/02/2017    SPECTYPE Urine 08/09/2017    SPECTYPE Urine 02/13/2016    SPECSOURCE Urine 12/02/2017    SPECSOURCE Urine 08/09/2017    SPECSOURCE Urine 02/13/2016     ?? GC/CT NAATs -- needed but deferred to future visit  <==========  ?? RPR -- NR 11/2016      Health maintenance  Lab Results   Component Value Date    CREATININE 0.95 02/18/2018    QFTTBGOLD Negative 04/15/2015    HEPCAB Nonreactive 11/27/2016    CHOL 184 04/15/2015    HDL 69 (H) 04/15/2015    LDL 104 (H) 04/15/2015    LDL 104.2 (  H) 04/15/2015    NONHDL 115 04/15/2015    TRIG 55 04/15/2015     Communicable diseases  # TB - neg IGRA 03/2015 -- NEED TO UPDATE  <============  # HCV - negative Ab 11/2016; rescreen w/Ab q1-2y    Cancer screening  # Anorectal - not yet done -- NEEDED  # Colorectal - not yet needed  # Liver - not applicable  # Lung - not applicable  # Prostate - not yet done; confirmed no FHx    Cardiovascular disease  # The 10-year ASCVD risk score Denman George DC Jr., et al., 2013) is: 2.9%  - Not on ASA  - Not on statin  - BP control ***  - Current smoker      Immunization History   Administered Date(s) Administered   ??? Influenza Virus Vaccine, unspecified formulation 03/15/2015, 02/16/2016   ??? PNEUMOCOCCAL POLYSACCHARIDE 23 01/03/2015   ??? PPD Test 02/25/2018     ?? Screening ordered today: none  ?? Immunizations ordered today: influenza and PCV-13 -- needs mening and PPSV23 #2 (sandwich)      Counseling services took 15 minutes of today's visit time.      Disposition  Return to clinic 3-4 months or sooner if needed.    To do @ next RTC  ?? IGRA  ?? Anal Pap  ?? HCV Ab  ?? Suboxone dose same?  ?? Bipolar regimen - verify & identify prescriber  ?? Movm't toward augmentation?           Subjective      Chief Complaint   Follow-up HIV      HPI  In addition to details in A&P above:  ***    Doing well overall and aside from above, denies any interim c/o since last clinic visit.    She's trying to get things lined up to have breast augmentation done thru Dr [facility] practice @ [facility].        Past Medical History:   Diagnosis Date   ??? ADHD (attention deficit hyperactivity disorder)    ??? Alcoholism (CMS-HCC)    ??? Anorexia nervosa    ??? Anxiety    ??? Bipolar disorder (CMS-HCC)    ??? Cocaine use disorder, moderate, dependence (CMS-HCC) 08/08/2017   ??? Depression    ??? Depressive disorder    ??? HIV (human immunodeficiency virus infection) (CMS-HCC)    ??? Migraines    ??? Panic disorder    ??? R/O Borderline personality disorder 12/02/2015   ??? Seizures (CMS-HCC)    ??? Substance abuse (CMS-HCC)    ??? Substance Use Treatment History          Social History  Housing - in men's shelter [facility]; is hopeful about placement in apt ([facility] with other residents  School / Work - Not in school. On disability.    Tobacco - 1/2-1.5 ppd x 20 years as of 2018  Alcohol - 2x/month w/dining out (though prob really ought to be none, 2/2 recovery)  Substance use - previous (coke + crack, Rx pain pills, EtOH, minor mj).      Review of Systems  As per HPI. All others negative.    Medications and Allergies  She has a current medication list which includes the following prescription(s): albuterol, buprenorphine-naloxone, buprenorphine-naloxone, bupropion, bupropion, dolutegravir, emtricitabine-tenofovir alafen, estradiol valerate, gabapentin, hydrochlorothiazide, medroxyprogesterone, nicotine, nicotine polacrilex, prazosin, spironolactone, and tamsulosin.    Allergies: Abilify [aripiprazole]; Amoxicillin; Penicillins; Toradol [ketorolac]; and Ultram [tramadol]  Family History  Her family history includes ADD / ADHD in her mother; Anxiety disorder in her father, mother, and sister; Bipolar disorder in her cousin, maternal aunt, mother, and sister; Depression in her cousin, maternal aunt, mother, and sister; Drug abuse in her brother, cousin, and mother; Paranoid behavior in her sister; Seizures in her sister.          Objective      There were no vitals taken for this visit. ***    Const looks well and attentive, alert, appropriate   Eyes sclerae anicteric, noninjected OU   ENT dentition good and no thrush, leukoplakia or oral lesions   Lymph no cervical or supraclavicular LAD   CV RRR. No murmurs. No rub or gallop. S1/S2.   Resp CTAB ant/post, normal work of breathing   GI Obese, soft. NTND. NABS.   GU deferred   Rectal deferred   Skin no petechiae, ecchymoses or obvious rashes on clothed exam   MSK no joint tenderness   Neuro CN II-XII grossly intact, MAEE, non focal   Psych Appropriate affect. Eye contact good. Linear thoughts. Fluent speech.          [id number]

## 2018-03-22 NOTE — Unmapped (Signed)
Duration of Intervention: 5 minutes    REASON FOR DISCHARGE: Not engaged.    EFFECTIVE DATE:  03/22/18    OUTSIDE REFERRALS MADE: None     Bradly Bienenstock LCSWA, CHES

## 2018-04-04 MED ORDER — EMTRICITABINE 200 MG-TENOFOVIR ALAFENAMIDE FUMARATE 25 MG TABLET
ORAL_TABLET | Freq: Every day | ORAL | 0 refills | 0.00000 days | Status: CP
Start: 2018-04-04 — End: 2018-06-06

## 2018-04-04 MED ORDER — BUPROPION HCL XL 150 MG 24 HR TABLET, EXTENDED RELEASE
ORAL_TABLET | Freq: Every day | ORAL | 0 refills | 0.00000 days | Status: CP
Start: 2018-04-04 — End: 2018-05-04

## 2018-04-04 MED ORDER — DOLUTEGRAVIR 50 MG TABLET
ORAL_TABLET | Freq: Every day | ORAL | 0 refills | 0.00000 days | Status: CP
Start: 2018-04-04 — End: 2018-06-06

## 2018-04-04 MED ORDER — BUPROPION HCL XL 300 MG 24 HR TABLET, EXTENDED RELEASE
ORAL_TABLET | Freq: Every day | ORAL | 0 refills | 0.00000 days | Status: CP
Start: 2018-04-04 — End: 2018-05-04

## 2018-04-05 NOTE — Unmapped (Signed)
[facility] Refill Coordination Note  Medication: SPECIALTY:   TIVICAY  DESCOVY    Unable to reach patient to schedule shipment for medication being filled at [facility]. Left voicemail on phone.  As this is the 3rd unsuccessful attempt to reach the patient, no additional phone call attempts will be made at this time.      Phone numbers attempted: [phone]  Last scheduled delivery: APPEARS TO BE 9/10 IN [facility]    Please call the [facility] at [phone] (option 4) should you have any further questions.      Thanks,  [facility] Specialty Team

## 2018-04-06 NOTE — Unmapped (Signed)
Ms. Ryan Robinson is a 71 TF who is HIV+ and has her care managed at [facility]. Patient has complicated history, including multiple hospitalizations, SA and nonadherence to ART. She has also been having a difficult time remaining in care with [facility]. ID PharmD asked to by [facility] pharmacy contact patient because they were unable to contact her for medication delivery. ID pharmD attempted to contact patient, but was unable. Discussed case with our clinic social workers who provided me with the contact information for Ryan Robinson [phone]), her case manager.     I spoke to her case manager this AM and she informed me that they have also had a difficult time contacting the patient. She is currently not in contact with them. They are planning to complete an unscheduled home visit to re-engage the patient.    Patient is RW eligible so I will also engage our [employer] in patient's case as well. Pt no-showed her most recent appointment with [place] (03/17/18).     Will forward note to [facility] pharmacy staff for FYI.

## 2018-06-03 ENCOUNTER — Ambulatory Visit: Admit: 2018-06-03 | Discharge: 2018-06-07 | Disposition: A | Discharge status: Home / Self Care | Payer: MEDICARE

## 2018-06-03 LAB — EGFR CKD-EPI AA FEMALE: Lab: >90

## 2018-06-03 LAB — THYROID STIMULATING HORMONE: Thyrotropin:ACnc:Pt:Ser/Plas:Qn:: 2.225

## 2018-06-03 LAB — AMPHETAMINE SCREEN URINE: Lab: <500

## 2018-06-03 LAB — CBC W/ AUTO DIFF
BASOPHILS RELATIVE PERCENT: 0.5 %
EOSINOPHILS ABSOLUTE COUNT: 0.1 10*9/L (ref 0.0–0.4)
EOSINOPHILS RELATIVE PERCENT: 1.4 %
HEMATOCRIT: 51.3 % — ABNORMAL HIGH (ref 41.0–46.0)
HEMOGLOBIN: 16.4 g/dL — ABNORMAL HIGH (ref 13.5–16.0)
LARGE UNSTAINED CELLS: 3 % (ref 0–4)
LYMPHOCYTES ABSOLUTE COUNT: 2.5 10*9/L (ref 1.5–5.0)
LYMPHOCYTES RELATIVE PERCENT: 28.3 %
MEAN CORPUSCULAR HEMOGLOBIN: 32.2 pg (ref 26.0–34.0)
MEAN CORPUSCULAR VOLUME: 100.5 fL — ABNORMAL HIGH (ref 80.0–100.0)
MONOCYTES ABSOLUTE COUNT: 0.4 10*9/L (ref 0.2–0.8)
MONOCYTES RELATIVE PERCENT: 4.5 %
NEUTROPHILS ABSOLUTE COUNT: 5.6 10*9/L (ref 2.0–7.5)
NEUTROPHILS RELATIVE PERCENT: 62.3 %
PLATELET COUNT: 198 10*9/L (ref 150–440)
RED BLOOD CELL COUNT: 5.1 10*12/L (ref 4.50–5.20)
WBC ADJUSTED: 9 10*9/L (ref 4.5–11.0)

## 2018-06-03 LAB — URINALYSIS
BILIRUBIN UA: NEGATIVE
BLOOD UA: NEGATIVE
GLUCOSE UA: NEGATIVE
KETONES UA: NEGATIVE
NITRITE UA: NEGATIVE
PH UA: 5.5 (ref 5.0–9.0)
PROTEIN UA: NEGATIVE
RBC UA: 5 /HPF — ABNORMAL HIGH (ref <=3)
SQUAMOUS EPITHELIAL: 3 /HPF (ref 0–5)
UROBILINOGEN UA: 0.2

## 2018-06-03 LAB — TOXICOLOGY SCREEN, URINE
AMPHETAMINE SCREEN URINE: <500
BARBITURATE SCREEN URINE: <200
BENZODIAZEPINE SCREEN, URINE: <200
METHADONE SCREEN, URINE: <300
OPIATE SCREEN URINE: <300

## 2018-06-03 LAB — POTASSIUM: Potassium:SCnc:Pt:Ser/Plas:Qn:: 4.5

## 2018-06-03 LAB — COMPREHENSIVE METABOLIC PANEL
ALBUMIN: 4.1 g/dL (ref 3.5–5.0)
ALKALINE PHOSPHATASE: 39 U/L (ref 38–126)
ALT (SGPT): 34 U/L (ref <35)
ANION GAP: 10 mmol/L (ref 7–15)
BILIRUBIN TOTAL: 0.9 mg/dL (ref 0.0–1.2)
CALCIUM: 9.2 mg/dL (ref 8.5–10.2)
CHLORIDE: 104 mmol/L (ref 98–107)
CO2: 22 mmol/L (ref 22.0–30.0)
CREATININE: 0.71 mg/dL (ref 0.65–1.15)
EGFR CKD-EPI AA FEMALE: >90 mL/min/{1.73_m2} (ref >=60)
EGFR CKD-EPI AA MALE: >90 mL/min/{1.73_m2} (ref >=60)
EGFR CKD-EPI NON-AA FEMALE: >90 mL/min/{1.73_m2} (ref >=60)
EGFR CKD-EPI NON-AA MALE: >90 mL/min/{1.73_m2} (ref >=60)
GLUCOSE RANDOM: 97 mg/dL (ref 70–179)
PROTEIN TOTAL: 7.5 g/dL (ref 6.5–8.3)
SODIUM: 136 mmol/L (ref 135–145)

## 2018-06-03 LAB — LYMPH MARKER LIMITED,FLOW
ABSOLUTE CD3 CNT: 2327 {cells}/uL (ref 915–3400)
ABSOLUTE CD8 CNT: 2050 {cells}/uL — ABNORMAL HIGH (ref 180–1520)
CD3% (T CELLS)": 84 % (ref 61–86)
CD4% (T HELPER)": 8 % — ABNORMAL LOW (ref 34–58)
CD4:CD8 RATIO: 0.1 — ABNORMAL LOW (ref 0.9–4.8)

## 2018-06-03 LAB — NEUTROPHILS ABSOLUTE COUNT: Lab: 5.6

## 2018-06-03 LAB — ACETAMINOPHEN LEVEL: Acetaminophen:MCnc:Pt:Ser/Plas:Qn:: <10

## 2018-06-03 LAB — SALICYLATE: Salicylates:MCnc:Pt:Ser/Plas:Qn:: <10

## 2018-06-03 LAB — ETHANOL: Ethanol:MCnc:Pt:Ser/Plas:Qn:GC: <10

## 2018-06-03 LAB — NITRITE UA: Lab: NEGATIVE

## 2018-06-03 LAB — CD3% (T CELLS)": Lab: 84

## 2018-06-03 LAB — AST (SGOT): Aspartate aminotransferase:CCnc:Pt:Ser/Plas:Qn:: 39

## 2018-06-03 LAB — LIPASE: Triacylglycerol lipase:CCnc:Pt:Ser/Plas:Qn:: 219

## 2018-06-03 NOTE — Unmapped (Signed)
[facility]  Emergency Department Provider Note      ED Clinical Impression     Final diagnoses:   None       Initial Impression, ED Course, Assessment and Plan     ED Triage Vitals [06/03/18 0024]   Vital Signs Group      Temp 37.1 ??C (98.8 ??F)      Temp Source Oral      Heart Rate 86      SpO2 Pulse 86      Heart Rate Source SpO2      Resp 18      BP 150/94      MAP (mmHg)       BP Location Left arm      BP Method Automatic      Patient Position Sitting   SpO2 99 %       Past Medical History:   Diagnosis Date   ??? ADHD (attention deficit hyperactivity disorder)    ??? Alcoholism (CMS-HCC)    ??? Anorexia nervosa    ??? Anxiety    ??? Bipolar disorder (CMS-HCC)    ??? Cocaine use disorder, moderate, dependence (CMS-HCC) 08/08/2017   ??? Depression    ??? Depressive disorder    ??? HIV (human immunodeficiency virus infection) (CMS-HCC)    ??? Migraines    ??? Panic disorder    ??? R/O Borderline personality disorder 12/02/2015   ??? Seizures (CMS-HCC)    ??? Substance abuse (CMS-HCC)    ??? Substance Use Treatment History        Impression: Ryan Robinson is a 43 y.o. adult with a past medical history as above, currently not taking HIV medications, presented to ED for ration of suicidal ideations.  Patient reports that she recently has life stressors including breaking out with a boyfriend, and also has some financial issues, and she had increased frequency of suicidal ideations with a plan to overdose on pills but she did not take any pills reportedly.  Patient otherwise states that she feels fine, no other complaints.  Patient has some chills lately, no cough, no shortness of breath, no chest pain, no abdominal pain, been eating and drinking well.    On exam, patient lungs clear, heart sounds normal.    Abdomen soft, nondistended, nontender.    We will obtain labs for medical clearance.  Will involve psych given patient's suicidal ideation.  Patient is at high risk.  Patient is on one-to-one.  Patient did had a absolute CD4 count of 248 on 02/21/2018, patient had been off HIV medication for at least 2 months reportedly.  I worry about her CD4 count now.  If it is less than 200, she will need prophylaxis antibiotics.  Will obtain CD4 count for patient.        ED Course as of Jun 03 537   Fri Jun 03, 2018   0502 Patient has evidence urinary tract infection.  Given patient has male urinary anatomy, will treat patient has a complicated UTI with 3 dose of fosfomycin.        0700 -I signed out patient care to my colleague Dr. Virginia Rochester.  Pending medical clearance     Additional Medical Decision Making     I have reviewed the vital signs and the nursing notes. Labs and radiology results that were available during my care of the patient were independently reviewed by me and considered in my medical decision making.     Case  discussed with ED attending Dr. Danella Sensing who is agreeable with the plan.    ____________________________________________       History     Chief Complaint  Suicidal      HPI   Ryan Robinson is a 43 y.o. adult with a past medical history as above, currently not taking HIV medications, presented to ED for ration of suicidal ideations.  Patient reports that she recently has life stressors including breaking out with a boyfriend, and also has some financial issues, and she had increased frequency of suicidal ideations with a plan to overdose on pills but she did not take any pills reportedly.  Patient otherwise states that she feels fine, no other complaints.  Patient has some chills lately, no cough, no shortness of breath, no chest pain, no abdominal pain, been eating and drinking well.     Review of Systems:  Constitutional: no fever.  Eyes: no visual changes.  ENT: no sore throat.  Cardiovascular: no chest pain.  Respiratory: no shortness of breath.  Gastrointestinal: no abdominal pain, no vomiting, no diarrhea.  Genitourinary: no dysuria.  Musculoskeletal: no back pain.  Skin: no rash.  Neurological: no headaches, no focal weakness or numbness.    Past Medical History:   Diagnosis Date   ??? ADHD (attention deficit hyperactivity disorder)    ??? Alcoholism (CMS-HCC)    ??? Anorexia nervosa    ??? Anxiety    ??? Bipolar disorder (CMS-HCC)    ??? Cocaine use disorder, moderate, dependence (CMS-HCC) 08/08/2017   ??? Depression    ??? Depressive disorder    ??? HIV (human immunodeficiency virus infection) (CMS-HCC)    ??? Migraines    ??? Panic disorder    ??? R/O Borderline personality disorder 12/02/2015   ??? Seizures (CMS-HCC)    ??? Substance abuse (CMS-HCC)    ??? Substance Use Treatment History        Patient Active Problem List   Diagnosis   ??? HIV (human immunodeficiency virus infection) (CMS-HCC)   ??? Major depression, recurrent, severe without psychosis   ??? Substance induced mood disorder (CMS-HCC)   ??? Alcohol use disorder, moderate, dependence (CMS-HCC)   ??? Essential hypertension (RAF-HCC)   ??? R/O Borderline personality disorder   ??? Urethral stricture   ??? 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 Cigarette nicotine dependence without complication   ??? Acute cystitis   ??? 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 Cocaine dependence (CMS-HCC)   ??? Acute stress disorder       Past Surgical History:   Procedure Laterality Date   ??? HIP ARTHROPLASTY     ??? JOINT REPLACEMENT  2011    right hip replacement 2/2 AVN         Current Facility-Administered Medications:   ???  fosfomycin (MONUROL) packet 3 g, 3 g, Oral, Every Other Day, Raynald Blend, MD    Current Outpatient Medications:   ???  albuterol (PROVENTIL HFA;VENTOLIN HFA) 90 mcg/actuation inhaler, Inhale 2 puffs every four (4) hours as needed for wheezing., Disp: 1 Inhaler, Rfl: 0  ???  buPROPion (WELLBUTRIN XL) 150 MG 24 hr tablet, Take 1 tablet (150 mg) by mouth daily with 300 mg tablet for a total daily dose of 450 mg, Disp: 30 tablet, Rfl: 0  ???  buPROPion (WELLBUTRIN XL) 300 MG 24 hr tablet, Take 1 tablet (300 mg) by mouth daily with 150 mg tablet for a total daily dose of 450 mg, Disp: 30 tablet,  Rfl: 0  ???  dolutegravir (TIVICAY) 50 mg Tab TABLET, Take 1 tablet (50 mg total) by mouth daily., Disp: 30 tablet, Rfl: 0  ???  estradiol valerate (DELESTROGEN) 10 mg/mL Oil injection, Inject 10 mg into the muscle every seven (7) days., Disp: , Rfl:   ???  gabapentin (NEURONTIN) 300 MG capsule, TAKE 4 CAPSULES (1200 MG) BY MOUTH THREE TIMES DAILY FOR PAIN, Disp: 360 capsule, Rfl: 9  ???  hydroCHLOROthiazide (HYDRODIURIL) 25 MG tablet, TAKE 1 TABLET BY MOUTH ONCE DAILY IN THE MORNING ( USED FOR HIGH BLOOD PRESSURE ), Disp: 30 tablet, Rfl: 9  ???  medroxyPROGESTERone (PROVERA) 10 MG tablet, TAKE 1 TABLET BY MOUTH ONCE DAILY FOR 14 DAYS, Disp: 14 tablet, Rfl: 0  ???  nicotine (NICODERM CQ) 21 mg/24 hr patch, Place 1 patch on the skin daily as needed., Disp: 28 patch, Rfl: 0  ???  prazosin (MINIPRESS) 1 MG capsule, Take 3 capsules (3 mg total) by mouth nightly., Disp: 90 capsule, Rfl: 0  ???  spironolactone (ALDACTONE) 100 MG tablet, Take 1 tablet (100 mg total) by mouth daily., Disp: 30 tablet, Rfl: 0  ???  tamsulosin (FLOMAX) 0.4 mg capsule, Take 1 capsule (0.4 mg total) by mouth nightly., Disp: 90 capsule, Rfl: 3    Allergies  Abilify [aripiprazole]; Amoxicillin; Penicillins; Toradol [ketorolac]; and Ultram [tramadol]    Family History   Problem Relation Age of Onset   ??? Bipolar disorder Mother    ??? ADD / ADHD Mother    ??? Anxiety disorder Mother    ??? Depression Mother    ??? Drug abuse Mother    ??? Anxiety disorder Father    ??? Anxiety disorder Sister    ??? Bipolar disorder Sister    ??? Depression Sister    ??? Paranoid behavior Sister    ??? Seizures Sister    ??? Drug abuse Brother    ??? Bipolar disorder Maternal Aunt    ??? Depression Maternal Aunt    ??? Bipolar disorder Cousin    ??? Depression Cousin    ??? Drug abuse Cousin        Social History  Social History     Tobacco Use   ??? Smoking status: Current Every Day Smoker     Packs/day: 1.00     Years: 20.00     Pack years: 20.00     Types: Cigarettes   ??? Smokeless tobacco: Never Used   Substance Use Topics   ??? Alcohol use: Yes     Comment: 3-4 drinks of 24oz daily   ??? Drug use: Yes     Types: Crack cocaine, Marijuana     Comment: clean almost one year         Physical Exam     ED Triage Vitals [06/03/18 0024]   Enc Vitals Group      BP 150/94      Heart Rate 86      SpO2 Pulse 86      Resp 18      Temp 37.1 ??C (98.8 ??F)      Temp Source Oral      SpO2 99 %      Weight       Height       Head Circumference       Peak Flow       Pain Score       Pain Loc       Pain Edu?  Excl. in GC?        Constitutional: Alert and oriented. Well appearing. In no distress.  Eyes: Conjunctivae are normal.  ENT       Head: Normocephalic and atraumatic.       Nose: No congestion.       Mouth/Throat: Mucous membranes are moist.       Neck: No stridor. Supple, full ROM.  Hematological/Lymphatic/Immunilogical: No cervical lymphadenopathy.  Cardiovascular: rate as vital signs, regular rhythm. Normal and symmetric distal pulses are present in all extremities.  Respiratory: Normal respiratory effort, symmetrical expansion of chest. No wheezes, no rales.  Gastrointestinal: Soft and nontender. No rigid abdomen present.  Genitourinary: Deferred  Musculoskeletal: No tenderness or edema of bilateral LEs.  Neurologic: Normal speech and language. No gross focal neurologic deficits are appreciated.  Skin: Skin is warm, dry and intact. No rash noted.  Psychiatric: Mood and affect are normal. Speech and behavior are normal.    Radiology     No orders to display       Pertinent labs & imaging results that were available during my care of the patient were reviewed by me and considered in my medical decision making (see chart for details).     Please note- This chart has been created using [employer]. Chart creation errors have been sought, but may not always be located and such creation errors, especially pronoun confusion, do NOT reflect on the standard of medical care.          Raynald Blend, MD  Resident  06/03/18 [phone]
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
## 2018-06-03 NOTE — Unmapped (Signed)
Pt comes in for suicidal ideations. States having bad thoughts about suicide, and that she will overdose on pills to kill herself. States that she has been off of her meds for months.     Pt denies HI, A/V hallucinations.

## 2018-06-03 NOTE — Unmapped (Signed)
ED Progress Note    June 03, 2018 9:04 AM    Assumed care of pt at shift change.    Ryan Robinson is a 43 y/o Transfemale with hx of HIV, MDD, HTN, borderlin personality d/o, urethral stricture, and substance use disorder who presented to ED last night with c/o suicidal ideation. Citing increased situational stress including break up with boyfriend and financial stress.     She is well known to me due to multiple similar presentations.     She reports doing overall well at this time however does complain of her typical chronic body pain.    O  Constitutional: Alert and oriented, NAD  Eyes: Conjunctivae are normal. Pupils equal and reactive.   ENT       Head: Normocephalic and atraumatic.       Nose: No epistaxis.       Mouth/Throat: Mucous membranes are moist.       Neck: No stridor. No nuchal rigidity.  Respiratory: Normal respiratory effort.   Musculoskeletal: Nontender with normal range of motion in all extremities.              Right lower leg: No tenderness or edema.              Left lower leg: No tenderness or edema.  Neurologic:  No gross focal neurologic deficits are appreciated.  Skin: Skin is warm, dry and intact. No rash noted.  Psychiatric:  calm, cooperative    A/P  - HIV- history of medication non-compliance. Admits to non-compliance since discharge from last ED stay. No evidence of opportunistic infection at this time. Given her chronic non-compliance outside of hospitalization will hold ARVs at this time as to not promote resistance. Cd4 and Viral load pending, will consult ID if remains in ED through the weekend, otherwise recommend she follow up with her ID provider after discharge.    - Chronic pain- resume suboxone 8mg  BID, 4mg  QD and gabapentin 1200mg  TID.    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

## 2018-06-03 NOTE — Unmapped (Signed)
Pt.here from triage for SI with a plan to OD on pills. Pt.stated I am disappointed in myself. Pt.stated that she had been using crack and drinking alcohol. She states that she has lost almost 80lbs and has not been taking her meds. Pt.is calm and co-operative. Denies c/o pain or discomfort at this time. Denies any symptoms of withdrawal at this time. Pt.oriented to the unit and to unit policies. Pt.is 1:1 for suicide precautions. Sitter at the bedside. Suicide precautions in place. Bed locked and in the lowest position. Side rails up x3. Pt.dressed in a purple gown. Id bracelet and nonskid socks on. Belongings locked up in locker #40. Will continue to monitor and support pt.

## 2018-06-04 NOTE — Unmapped (Signed)
Pt escorted  By [facility]  And staff  To a Shower

## 2018-06-04 NOTE — Unmapped (Signed)
Pt at the nurses  Station  Using  [employer]

## 2018-06-04 NOTE — Unmapped (Signed)
Pt relation was  Paged  Per pt request,

## 2018-06-04 NOTE — Unmapped (Signed)
Peer Support Specialist Brief Intervention    Patient Information:  Name: Ryan Robinson  MRN: [id number]  Age: 43 y.o.  Chief Complaint   Patient presents with   ??? Suicidal     Patient Active Problem List   Diagnosis   ??? HIV (human immunodeficiency virus infection) (CMS-HCC)   ??? Major depression, recurrent, severe without psychosis   ??? Substance induced mood disorder (CMS-HCC)   ??? Alcohol use disorder, moderate, dependence (CMS-HCC)   ??? Essential hypertension (RAF-HCC)   ??? R/O Borderline personality disorder   ??? Urethral stricture   ??? 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 Cigarette nicotine dependence without complication   ??? Acute cystitis   ??? 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 Cocaine use disorder, severe    ??? Acute stress disorder       Current Facility-Administered Medications:   ???  buprenorphine-naloxone (SUBOXONE) 2-0.5 mg SL film 4 mg, 4 mg, Sublingual, Daily, Santa Genera, NP, 4 mg at 06/03/18 1944  ???  buprenorphine-naloxone (SUBOXONE) 8-2 mg SL film 8 mg, 8 mg, Sublingual, BID, Santa Genera, NP, 8 mg at 06/04/18 1255  ???  estradiol (ESTRACE) tablet 4 mg, 4 mg, Oral, BID, Barbie Banner, MD, 4 mg at 06/04/18 1457  ???  fosfomycin (MONUROL) packet 3 g, 3 g, Oral, Every Other Day, Raynald Blend, MD, 3 g at 06/03/18 5621  ???  gabapentin (NEURONTIN) capsule 1,200 mg, 1,200 mg, Oral, TID, Santa Genera, NP, 1,200 mg at 06/04/18 1318  ???  hydroCHLOROthiazide (HYDRODIURIL) tablet 25 mg, 25 mg, Oral, Daily, Santa Genera, NP, 25 mg at 06/04/18 0845  ???  nicotine (NICODERM CQ) 21 mg/24 hr patch 1 patch, 1 patch, Transdermal, Daily PRN, Santa Genera, NP, 1 patch at 06/04/18 1318  ???  spironolactone (ALDACTONE) tablet 100 mg, 100 mg, Oral, Daily, Santa Genera, NP, 100 mg at 06/04/18 0845  ???  tamsulosin (FLOMAX) 24 hr capsule 0.4 mg, 0.4 mg, Oral, Nightly, Santa Genera, NP, 0.4 mg at 06/03/18 2120    Current Outpatient Medications:   ???  buprenorphine-naloxone (SUBOXONE) 8-2 mg sublingual film, Take 2 & 1/2 films by mouth daily., Disp: , Rfl:   ???  cloNIDine HCl (CATAPRES) 0.1 MG tablet, Take 1/2-1 tablet by mouth twice daily as needed for anxiety., Disp: , Rfl:   ???  estradiol (ESTRACE) 2 MG tablet, Take 4 mg by mouth Two (2) times a day., Disp: , Rfl:   ???  albuterol (PROVENTIL HFA;VENTOLIN HFA) 90 mcg/actuation inhaler, Inhale 2 puffs every four (4) hours as needed for wheezing., Disp: 1 Inhaler, Rfl: 0  ???  buPROPion (WELLBUTRIN XL) 150 MG 24 hr tablet, Take 1 tablet (150 mg) by mouth daily with 300 mg tablet for a total daily dose of 450 mg, Disp: 30 tablet, Rfl: 0  ???  buPROPion (WELLBUTRIN XL) 300 MG 24 hr tablet, Take 1 tablet (300 mg) by mouth daily with 150 mg tablet for a total daily dose of 450 mg, Disp: 30 tablet, Rfl: 0  ???  dolutegravir (TIVICAY) 50 mg Tab TABLET, Take 1 tablet (50 mg total) by mouth daily., Disp: 30 tablet, Rfl: 0  ???  gabapentin (NEURONTIN) 300 MG capsule, TAKE 4 CAPSULES (1200 MG) BY MOUTH THREE TIMES DAILY FOR PAIN, Disp: 360 capsule, Rfl: 9  ???  hydroCHLOROthiazide (HYDRODIURIL) 25 MG tablet, TAKE 1 TABLET BY MOUTH ONCE DAILY IN THE MORNING ( USED FOR HIGH BLOOD  PRESSURE ), Disp: 30 tablet, Rfl: 9  ???  prazosin (MINIPRESS) 1 MG capsule, Take 3 capsules (3 mg total) by mouth nightly., Disp: 90 capsule, Rfl: 0  ???  spironolactone (ALDACTONE) 100 MG tablet, Take 1 tablet (100 mg total) by mouth daily., Disp: 30 tablet, Rfl: 0  ???  tamsulosin (FLOMAX) 0.4 mg capsule, Take 1 capsule (0.4 mg total) by mouth nightly., Disp: 90 capsule, Rfl: 3      Reason for Consult: Peer Support Specialist met with patient to develop personalized Wellness Recovery Action Plan (WRAP).     Daily Plan  What am I like on my best day?  How would other people describe me, or how would I describe myself, when I'm well or when I'm doing  the things I want to do with my life?  What do I need to do every day to stay well or stay on track with my goals?  What things might I need to do on some days to stay well? When or how often do I need to do them?  What does a typical day look like when I am taking care of    Stressors  Stressors are things that happen that can cause a reaction, such as reminders of specific events,  anniversaries of losses, or personal interactions or situations that make [place] feel scared, helpless or out of  control.  What things might make me feel unwell or throw me off track if they happened?    Action Plan for Stressors  What actions will I take and what wellness tools will I use to respond to stressors that come up?  What can I do to limit my exposure to stressors?    Early Warning Signs  Early warning signs are changes in the way you think, act, or feel. They are signs that you need to take  action before things get worse.  What are my early warning signs?    Action Plan for Early Warning Signs  What actions will I take and what wellness tools will I use to respond when I notice my early warning  signs?  What additional actions or tools might I use to respond to my early warning signs if I feel like they will  Help?    When Things Are Breaking Down or Getting Much Worse  When things are breaking down or getting much worse, a crisis may be in our near future.  What are my signs that things are breaking down or getting much worse?    Action Plan for When Things Are Breaking Down  What actions will I take and what wellness tools will I use to respond when things are breaking down or  getting much worse?  What additional actions or tools might I use to respond to signs that things are breaking down or getting  much worse if I feel like they will help?    Crisis Plan  Your crisis plan is a tool to plan for the support you may need when you face a crisis. This plan can be the  basis for an advance directive, but it is not a legal document. Use your crisis plan to document your needs  and preferences and explain to others how you want to be supported through a crisis.  1. This plan was created on this date and replaces any plan with earlier dates:  2. This is what I look like when I'm well:  3. This is what I look  like when it gets too bad for me to handle on my own:  4. If my behavior endangers or has negative effects on me or others I want my supporters to:  5. If my supporters disagree on a course of action, I want them to settle the dispute in this way:  6. When I'm in a crisis, I want the following people to support me in these ways (list name, role, phone  number, and any other notes for each person):  7. When I'm in a crisis, the following people should not be involved in supporting me or making decisions  on my behalf (list name, relationship, and any details for each person):  8. When I'm in a crisis, please do these things to help me feel better and get back to wellness:  9. When I'm in a crisis, please do not do these things, which do not help and would make things much  worse:  10. When I'm in a crisis, these tasks need to be taken care of, and here's who I want to do them until I can  take over responsibility for them again (list the task or responsibility, the person you want to handle it,  their phone number, and any notes or instructions for each task):  11. These are the signs that will let my supporters know it's safe to stop using this crisis plan:    Post-Crisis Plan  After a crisis, it can take time to get back to normal. Use your post-crisis plan to develop your plan for  easing back into your responsibilities, dealing with any consequences resulting from the crisis, and  showing appreciation for the people who supported you during the crisis.  1. This is what I look like when I'm out of the crisis and ready to use this post-crisis plan:  2. This is how I want to feel when I'm fully recovered from this crisis:  3. What are choices I made or things I did that contributed to this crisis?  4. What did I learn from this crisis?  5. What are changes I want to make in my lifestyle and life goals? When and how will I make these  changes?  6. What help do I want or need to make these changes?  7. These are changes in my WRAP that might help prevent a crisis in the future:  8. These are things that will ease my transition back to everyday life if they are taken care of:  9. These are things I can ask someone else to do for me:  10. These are things that can wait until I've successfully transitioned out of the crisis and into recovery:  11. When I'm recovering from a crisis, I want the following people to support me in these ways (list name,  phone number, roles or tasks, and any additional details for each person):  12. These are things I need to do for myself every day:  13. These are other things I might need to do every day:  14. These are people, places, and things I need to avoid:  15. These are things I need to do to prevent more negative effects from this crisis:  16. These are the people I need to thank (include name, phone number, and why you're thankful for each  person):  17. These are people I need to apologize to or make amends with and when I will do it (include name,  phone number, and when  and how you'll make things right for each person):    Primary Substance Use Disorder: Cocaine/Crack    Secondary Substance Use Disorder(s): Alcohol    This patient's Change assessment indicates that they are in the Pre-Contemplative stage.    Pre-Contemplation   Contemplation Planning Action Maintenance   Not thinking about or has rejected change.   Thinking and talking about change.  Seeks out support. Planning what it would take to make change happen. Taking positive steps by putting the plan into practice. Achieving positive and concrete developments with continuing and potentially little support.       Does the patient desire treatment for substance use disorder? No, Does not desire treatment    - If YES, what type of treatment does the patient want? N/A    Is the patient interested in Medication Assisted Treatment for Alcohol and or Opioid Use Disorder? No, Not interested in MAT    Resources provided to the patient:  None      Notes: PSS met with the pt to discuss the [facility] HEALS Recovery Process:     ? H ealth???overcoming or managing one's disease(s) or symptoms and making informed, healthy choices that support physical, mental and spiritual well-being.  ? E nvironment???having a stable and safe place to live and a supportive, encouraging and recovery minded support system.  ? A spirations???conducting meaningful daily activities that reflect one's purpose and passion and having the independence, income, and resources to participate in society.  ? L Ife???having relationships and social networks that provide support, friendship, love, and hope.  Living and sharing life with others.  ? S ervice???experience the joy of giving back to society by using one's recovery story, habits and hurts to assist others reach their full life potential.      Patient identifies the following obstacles and/or goals in their personal recovery plan:    ? H ealth - SUD, SI, depression, HIV  ? E nvironment - No positive supports  ? A spirations - None  ? L Ife - No positive social support  ? S ervice - None    Notes:  PSS met with the pt to discuss the [facility] HEALS Recovery Process:     ? H ealth???overcoming or managing one's disease(s) or symptoms and making informed, healthy choices that support physical, mental and spiritual well-being.  ? E nvironment???having a stable and safe place to live and a supportive, encouraging and recovery minded support system.  ? A spirations???conducting meaningful daily activities that reflect one's purpose and passion and having the independence, income, and resources to participate in society.  ? L Ife???having relationships and social networks that provide support, friendship, love, and hope.  Living and sharing life with others.  ? S ervice???experience the joy of giving back to society by using one's recovery story, habits and hurts to assist others reach their full life potential.    Notes:  Patient does not seek treatment and spent the time with the [facility] complaining about not being able to shave and that she doesn't have a private room.  Expressed concern that the hospital was not sensitive to his needs.  [facility] provided 24/7 information.
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
## 2018-06-04 NOTE — Unmapped (Signed)
[facility]  At bedside

## 2018-06-04 NOTE — Unmapped (Signed)
Bed: 43-C  Expected date:   Expected time:   Means of arrival:   Comments:  Patient in holding

## 2018-06-04 NOTE — Unmapped (Signed)
Recreational Therapy Evaluation  06/03/2018     Patient Name:  [facility]       Medical Record Number: [id number]   Date of Birth: 05-Aug-1975  Sex: Adult          Room/Bed:  40-C/40-C    Eval Duration: 30 Min.    Assessment  Patient would benefit from recreational therapy services to increase knowledge of healthy coping skills and to manage symptoms of mental illness.      Plan of Care  1x per day for: 2-3x week   Planned Treatment Duration 2 weeks     Patient's Identified Treatment Goal: [facility] identified wanting to work towards finding safe housing and outpatient support as her primary goals for hospitalization.   Patient's Stressors / Triggers: [facility] identified her current living situation, drug use, finances, interpersonal conflict with neighbors/roommate, recent break-up, discrimination, and medication noncompliance as her major stressors.   Treatment Plan developed in collaboration with: Patient;Treatment Team  Interventions: Behavior management;Stress management;Discharge planning;Communication and social skills;Expressive arts;Relapse prevention;Community reintegration;Leisure;Relaxation training;Coping skills;Emotional self regulation;Goal setting     Goals:  1. By 06/10/18, patient will demonstrate 3 healthy coping strategies during Recreational Therapy intervention sessions, with fewer than 2 cues, demonstrating improved ability to manage symptoms of mental illness. ,       2. By 06/17/17, patient will identify a plan to independently implement 2 new coping strategies or leisure interests, demonstrating improved ability to maintain safety outside the hospital setting. ,        ,         ,         Interventions: Behavior management;Stress management;Discharge planning;Communication and social skills;Expressive arts;Relapse prevention;Community reintegration;Leisure;Relaxation training;Coping skills;Emotional self regulation;Goal setting       Subjective    Current Situation: Per chart, [facility] is a 43 year old male to male transgender patient with a history of depression, HIV, and polysubstance abuse; who presents for psychiatric evaluation following significant suicidal ideation with a plan to overdose on mediations. During Recreational Therapy evaluation, [facility] discussed ongoing stressors resulting in her current state of suicidal ideation.   Cognitive, Emotional, Physical, Social, and Leisure/Life functioning were assessed:: Patient Interviews;Review of Chart;Treatment Team;Observation in Activities/Interventions;No family present  Employment: Disability  Risk Factors: Suicidal ideation  Precautions: Psych Safety precautions  Reports/displays signs/symptoms of pain?: No    Past Medical History:   Diagnosis Date   ??? ADHD (attention deficit hyperactivity disorder)    ??? Alcoholism (CMS-HCC)    ??? Anorexia nervosa    ??? Anxiety    ??? Bipolar disorder (CMS-HCC)    ??? Cocaine use disorder, moderate, dependence (CMS-HCC) 08/08/2017   ??? Depression    ??? Depressive disorder    ??? HIV (human immunodeficiency virus infection) (CMS-HCC)    ??? Migraines    ??? Panic disorder    ??? R/O Borderline personality disorder 12/02/2015   ??? Seizures (CMS-HCC)    ??? Substance abuse (CMS-HCC)    ??? Substance Use Treatment History     Social History     Tobacco Use   ??? Smoking status: Current Every Day Smoker     Packs/day: 1.00     Years: 20.00     Pack years: 20.00     Types: Cigarettes   ??? Smokeless tobacco: Never Used   Substance Use Topics   ??? Alcohol use: Yes     Comment: 3-4 drinks of 24oz daily      Past Surgical History:   Procedure Laterality Date   ???  HIP ARTHROPLASTY     ??? 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 Bipolar disorder Mother    ??? ADD / ADHD Mother    ??? Anxiety disorder Mother    ??? Depression Mother    ??? Drug abuse Mother    ??? Anxiety disorder Father    ??? Anxiety disorder Sister    ??? Bipolar disorder Sister    ??? Depression Sister    ??? Paranoid behavior Sister    ??? Seizures Sister    ??? Drug abuse Brother    ??? Bipolar disorder Maternal Aunt    ??? Depression Maternal Aunt    ??? Bipolar disorder Cousin    ??? Depression Cousin    ??? Drug abuse Cousin         Allergies: Abilify [aripiprazole]; Amoxicillin; Penicillins; Toradol [ketorolac]; and Ultram [tramadol]     Objective    Cognitive  Stage of change / level of insight: Contemplation  Thought Process/Content: Organized  Judgment: Limited  Memory: Independent with recall  Follows Directions: Able to follow directions independently  Orientation: Fully oriented    Communication  Communication Barriers: None noted    Emotional  Mood: Depressed;Fair  Affect: Congruent  Coping Skills : Reports unhealthy coping strategies and endorses need to change([name] identified substance use as her primary coping strategy prior to admission, however acknowledged the need to alter this behavior. )  Motivated to learn new coping strategies?: Yes         Social  Support system: No support([name] identified having several friends that care, however she does not believe these people are able to support her mental illness. )  Patient Behaviors and Interactions: Appropriate;Appropriate for group;Appropriate for individual  Ability to form relationship / interact with others: Able to form social relationships independently     Leisure and Life Function  Level of involvement: Occasional participation([name] identified art, shopping, and watching TV/movies as her primary leisure interests. )  Motivation to engage in leisure / play: Yes      I attest that I have reviewed the above information.  Signed by Lasandra Beech 06/03/2018

## 2018-06-04 NOTE — Unmapped (Signed)
Psych Provider at bedside.
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
## 2018-06-05 NOTE — Unmapped (Signed)
Patient received breakfast. 

## 2018-06-05 NOTE — Unmapped (Signed)
Pt received  Dinner tray,  Ate 100% of  Diet

## 2018-06-06 MED ORDER — DOLUTEGRAVIR 50 MG TABLET
ORAL_TABLET | Freq: Every day | ORAL | 0 refills | 30 days | Status: CP
Start: 2018-06-06 — End: 2018-07-06
  Filled 2018-06-07: qty 30, 30d supply, fill #0

## 2018-06-06 MED ORDER — EMTRICITABINE 200 MG-TENOFOVIR ALAFENAMIDE FUMARATE 25 MG TABLET
ORAL_TABLET | Freq: Every day | ORAL | 0 refills | 0.00000 days | Status: CP
Start: 2018-06-06 — End: 2018-07-07

## 2018-06-06 NOTE — Unmapped (Signed)
Patient received breakfast [facility] NAII

## 2018-06-06 NOTE — Unmapped (Signed)
Pt. Has been calm and cooperative. Pt. Had some periods of complaints on wanting to move to [facility] or to room 44 because of the TV. Pt. States she has been incontinent of urine a few times. Sanitary pads and stretch briefs provided. Pt. Has been sociable with staff and other patients. Pt. Denies any SI/HI at present time. Will continue to monitor.

## 2018-06-06 NOTE — Unmapped (Signed)
Pt  Up the nurses  Station  Talking on the phone,

## 2018-06-06 NOTE — Unmapped (Signed)
Pt. ahs
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
## 2018-06-06 NOTE — Unmapped (Signed)
Report given to [facility]. Patient care transferred at this time.

## 2018-06-06 NOTE — Unmapped (Signed)
Pt with no acute events overnight, Pt slept off and on stated he has nightmares. Pt remains calm denies SI/HI/AVH. VSS stable, Q15 min safety checks. Will continue to monitor.

## 2018-06-06 NOTE — Unmapped (Signed)
ED Progress Note    June 06, 2018 9:27 AM    Assumed care of pt at shift change.    Ryan Robinson is a 43 y/o Transfemale with hx of HIV, MDD, HTN, borderlin personality d/o, urethral stricture, and substance use disorder who presented to ED last night with c/o suicidal ideation. Citing increased situational stress including break up with boyfriend and financial stress.     She is well known to me due to multiple similar presentations.     She reports doing overall well at this time with no complaints other than receiving breakfast late    O  Constitutional: Alert and oriented, NAD  Eyes: Conjunctivae are normal. Pupils equal and reactive.   ENT       Head: Normocephalic and atraumatic.       Nose: No epistaxis.       Mouth/Throat: Mucous membranes are moist.       Neck: No stridor. No nuchal rigidity.  Respiratory: Normal respiratory effort.   Musculoskeletal: Nontender with normal range of motion in all extremities.              Right lower leg: No tenderness or edema.              Left lower leg: No tenderness or edema.  Neurologic:  No gross focal neurologic deficits are appreciated.  Skin: Skin is warm, dry and intact. No rash noted.  Psychiatric:  calm, cooperative    A/P  - HIV- Discussed with ID consult fellow who recommends resuming descovy and tivicay regimen and outpatient follow up with ID clinic. Fellow will notify ID care manager of pt.    - Chronic pain- resume suboxone 8mg  BID, 4mg  QD and gabapentin 1200mg  TID.    - Gender dysphoria- resume sprinolactone 100mg  oral QD, estradiol 4mg  QD

## 2018-06-06 NOTE — Unmapped (Signed)
Report given to Reggie, RN

## 2018-06-06 NOTE — Unmapped (Signed)
Report received from [place], [place]. Pt observed ambulating around the unit, no request at this time, room check completed, environment safe. Q 15 min safety checks. Will continue to monitor.

## 2018-06-06 NOTE — Unmapped (Signed)
Assumed care of pt, pt lying in bed with eyes closed, NAD, WCM. Safety precautions in place.

## 2018-06-06 NOTE — Unmapped (Signed)
Peer support  At bedside

## 2018-06-07 LAB — HIV RNA, QUANTITATIVE, PCR: HIV RNA LOG(10): 4.24 {Log_copies}/mL — ABNORMAL HIGH (ref <0.00)

## 2018-06-07 LAB — HIV RNA QNT RSLT: HIV 1 RNA:PrThr:Pt:Ser/Plas:Ord:Probe.amp.tar: 0

## 2018-06-07 MED ORDER — BUPRENORPHINE 8 MG-NALOXONE 2 MG SUBLINGUAL FILM: Film | 0 refills | 0 days | Status: AC

## 2018-06-07 MED ORDER — BUPRENORPHINE 8 MG-NALOXONE 2 MG SUBLINGUAL FILM
ORAL_FILM | SUBLINGUAL | 0 refills | 0.00000 days | Status: CP
Start: 2018-06-07 — End: 2018-06-07
  Filled 2018-06-07: qty 10, 4d supply, fill #0

## 2018-06-07 MED FILL — DESCOVY 200 MG-25 MG TABLET: 30 days supply | Qty: 30 | Fill #0 | Status: AC

## 2018-06-07 MED FILL — DESCOVY 200 MG-25 MG TABLET: ORAL | 30 days supply | Qty: 30 | Fill #0

## 2018-06-07 MED FILL — TIVICAY 50 MG TABLET: 30 days supply | Qty: 30 | Fill #0 | Status: AC

## 2018-06-07 MED FILL — SUBOXONE 8 MG-2 MG SUBLINGUAL FILM: 4 days supply | Qty: 10 | Fill #0 | Status: AC

## 2018-06-07 NOTE — Unmapped (Signed)
Pt's  Laundry  Was  Done  By  [employer].

## 2018-06-07 NOTE — Unmapped (Signed)
Friend arrived on unit to visit pt, belongings stored in pt locker per psych policy

## 2018-06-07 NOTE — Unmapped (Signed)
Patient refused vitals prior to discharge

## 2018-06-07 NOTE — Unmapped (Signed)
Reassumed care of pt.

## 2018-06-07 NOTE — Unmapped (Addendum)
Patient seen and nursing notes reviewed.  Patient continues to deny acute safety concerns and is actively working towards locating long term substance recovery program.  Discussed [facility] versus facility based crisis versus shelter with ongoing out patient supports.  Patient endorses a plan to go to a shelter while sober living environment is located.  Patient applied for a housing services though [facility] and hopes to be eligible or LGBTQ sober living house/ transitonal program.  Patient has follow up scheduled with her current  [facility] providers and plans on calling [facility] later this afternoon to obtain appointment with her primary care doctor for ongoing management of suboxone.     Given lack of acute safety concerns, ongoing denial of suicidal or homicidal ideation  will discharge home at this time.     Mental Status Exam:  Appearance:    Appears stated age, Well nourished, Well developed and Clean/Neat   Behavior:   Cooperative, Direct eye contact and Polite   Motor:   No abnormal movements   Speech/Language:    Normal rate, volume, tone, fluency and Language intact, well formed   Mood:   hopeful    Affect:   Euthymic   Thought process:   Logical, linear, clear, coherent, goal directed   Thought content:     Denies SI, HI, self harm, delusions, obsessions, paranoid ideation, or ideas of reference   Perceptual disturbances:     Denies auditory and visual hallucinations, behavior not concerning for response to internal stimuli     Orientation:   Oriented to person, place, time, and general circumstances   Attention:   Able to fully attend without fluctuations in consciousness   Concentration:   Able to fully concentrate and attend   Memory:   Immediate, short-term, long-term, and recall grossly intact    Fund of knowledge:    Consistent with level of education and development   Insight:     Limited   Judgment:    Limited   Impulse Control:   Limited       Bonnetta Barry PMHNP

## 2018-06-08 ENCOUNTER — Ambulatory Visit: Admit: 2018-06-08 | Discharge: 2018-06-08 | Disposition: A | Discharge status: Home / Self Care | Payer: MEDICARE

## 2018-06-08 LAB — COMPREHENSIVE METABOLIC PANEL
ALBUMIN: 4.3 g/dL (ref 3.5–5.0)
ALKALINE PHOSPHATASE: 45 U/L (ref 38–126)
ALT (SGPT): 63 U/L — ABNORMAL HIGH (ref <35)
ANION GAP: 15 mmol/L (ref 7–15)
AST (SGOT): 43 U/L (ref 17–47)
BILIRUBIN TOTAL: 0.2 mg/dL (ref 0.0–1.2)
BLOOD UREA NITROGEN: 15 mg/dL (ref 7–21)
BUN / CREAT RATIO: 19
CHLORIDE: 98 mmol/L (ref 98–107)
CO2: 22 mmol/L (ref 22.0–30.0)
CREATININE: 0.8 mg/dL (ref 0.65–1.15)
EGFR CKD-EPI AA FEMALE: >90 mL/min/{1.73_m2} (ref >=60)
EGFR CKD-EPI AA MALE: >90 mL/min/{1.73_m2} (ref >=60)
EGFR CKD-EPI NON-AA FEMALE: >90 mL/min/{1.73_m2} (ref >=60)
EGFR CKD-EPI NON-AA MALE: >90 mL/min/{1.73_m2} (ref >=60)
GLUCOSE RANDOM: 98 mg/dL (ref 70–179)
POTASSIUM: 4.2 mmol/L (ref 3.5–5.0)
PROTEIN TOTAL: 7.6 g/dL (ref 6.5–8.3)
SODIUM: 135 mmol/L (ref 135–145)

## 2018-06-08 LAB — CBC W/ AUTO DIFF
BASOPHILS ABSOLUTE COUNT: 0.1 10*9/L (ref 0.0–0.1)
BASOPHILS RELATIVE PERCENT: 0.7 %
EOSINOPHILS ABSOLUTE COUNT: 0.2 10*9/L (ref 0.0–0.4)
EOSINOPHILS RELATIVE PERCENT: 1.7 %
HEMATOCRIT: 48.8 % — ABNORMAL HIGH (ref 41.0–46.0)
HEMOGLOBIN: 15.8 g/dL (ref 13.5–16.0)
LARGE UNSTAINED CELLS: 2 % (ref 0–4)
LYMPHOCYTES ABSOLUTE COUNT: 3.5 10*9/L (ref 1.5–5.0)
LYMPHOCYTES RELATIVE PERCENT: 33.3 %
MEAN CORPUSCULAR VOLUME: 96.7 fL (ref 80.0–100.0)
MONOCYTES ABSOLUTE COUNT: 0.5 10*9/L (ref 0.2–0.8)
MONOCYTES RELATIVE PERCENT: 4.6 %
NEUTROPHILS ABSOLUTE COUNT: 6.1 10*9/L (ref 2.0–7.5)
NEUTROPHILS RELATIVE PERCENT: 57.5 %
PLATELET COUNT: 272 10*9/L (ref 150–440)
RED BLOOD CELL COUNT: 5.05 10*12/L (ref 4.50–5.20)
RED CELL DISTRIBUTION WIDTH: 14.1 % (ref 12.0–15.0)
WBC ADJUSTED: 10.6 10*9/L (ref 4.5–11.0)

## 2018-06-08 LAB — LIPASE
LIPASE: 72 U/L (ref 44–232)
Triacylglycerol lipase:CCnc:Pt:Ser/Plas:Qn:: 72

## 2018-06-08 LAB — ACETAMINOPHEN LEVEL: Acetaminophen:MCnc:Pt:Ser/Plas:Qn:: <10

## 2018-06-08 LAB — ETHANOL: Ethanol:MCnc:Pt:Ser/Plas:Qn:GC: 81

## 2018-06-08 LAB — TOXICOLOGY SCREEN, URINE
AMPHETAMINE SCREEN URINE: <500
CANNABINOID SCREEN URINE: <20
METHADONE SCREEN, URINE: <300
OPIATE SCREEN URINE: <300

## 2018-06-08 LAB — URINALYSIS
BACTERIA: NONE SEEN /HPF
BLOOD UA: NEGATIVE
GLUCOSE UA: NEGATIVE
KETONES UA: NEGATIVE
NITRITE UA: NEGATIVE
PH UA: 5 (ref 5.0–9.0)
PROTEIN UA: NEGATIVE
RBC UA: 2 /HPF (ref <=3)
SPECIFIC GRAVITY UA: 1.01 (ref 1.003–1.030)
SQUAMOUS EPITHELIAL: 1 /HPF (ref 0–5)
UROBILINOGEN UA: 0.2
WBC UA: 31 /HPF — ABNORMAL HIGH (ref <=2)

## 2018-06-08 LAB — SALICYLATE: Salicylates:MCnc:Pt:Ser/Plas:Qn:: <10

## 2018-06-08 LAB — T3 FREE: Triiodothyronine.free:MCnc:Pt:Ser/Plas:Qn:: 4.45

## 2018-06-08 LAB — NEUTROPHILS ABSOLUTE COUNT: Lab: 6.1

## 2018-06-08 LAB — THYROID STIMULATING HORMONE: Thyrotropin:ACnc:Pt:Ser/Plas:Qn:: 8.659 — ABNORMAL HIGH

## 2018-06-08 LAB — BENZODIAZEPINE SCREEN, URINE: Lab: <200

## 2018-06-08 LAB — PREGNANCY TEST URINE: Lab: NEGATIVE

## 2018-06-08 LAB — PH UA: Lab: 5

## 2018-06-08 LAB — FREE T4: Thyroxine.free:MCnc:Pt:Ser/Plas:Qn:: 0.86

## 2018-06-08 LAB — POTASSIUM: Potassium:SCnc:Pt:Ser/Plas:Qn:: 4.2

## 2018-06-08 NOTE — Unmapped (Signed)
Pt informed

## 2018-06-08 NOTE — Unmapped (Signed)
Pt standing at nurses, argumentative about food tray as she eats peanut butter cracker.

## 2018-06-08 NOTE — Unmapped (Signed)
Pt out in the common area, socializing and laughing with different staff members. Pt was before upset about not getting a hot meal due to lunch not being able to be warmed because she started eating it already. Tried to explain to patient the infection control risk and why It could not be warmed after already eating it. Pt became upset and threw tray away.

## 2018-06-08 NOTE — Unmapped (Signed)
Pt asked for lunch tray to be heated up. Pt informed that tray will not be able to be heated up due to policies. Pt upset stating 'why have they been doing it all week? All of a sudden you cant? The food was congealed.' Pt called patient relations and was ordered new standard tray for nurse pick up for food to be hot.

## 2018-06-08 NOTE — Unmapped (Signed)
Pt was found to have 6 packets of suboxone films in unlabeled plastic bag in underwear. Inpatient pharmacy alerted and medication form filled out. Medication taken by this RN to inpatient pharmacy and turned in to them for storage until pt is discharged.

## 2018-06-08 NOTE — Unmapped (Signed)
Pt standing in hallway conversing with staff about her lunch tray.

## 2018-06-08 NOTE — Unmapped (Signed)
Pt received standard lunch tray

## 2018-06-08 NOTE — Unmapped (Signed)
Pt received breakfast meal tray

## 2018-06-08 NOTE — Unmapped (Signed)
[facility] (men's shelter)  [address]. [place], [place]  P: [phone]    [facility] (women and children's shelter)  [address], [place]  P: [phone]

## 2018-06-08 NOTE — Unmapped (Signed)
Pt woke up to use the bathroom due to being incontinent. New pants provided and linen on bed changed. Pt asking to order lunch but informed that standard will arrive for the first 24 hours.

## 2018-06-08 NOTE — Unmapped (Signed)
Pt. Received lunch tray  °

## 2018-06-08 NOTE — Unmapped (Signed)
[facility]   Emergency Department Provider Note    ED Final Clinical Impression     Final diagnoses:   Suicidal ideation (Primary)         ED Course, Assessment and Plan     Initial Clinical Impression:    Ryan Robinson is a 43 y.o. adult trans-male with PMH substance-induced mood, depression, polysubstance abuse, HIV who presents with suicidal ideation. Patient recently was inpatient here at [facility] from 1/17 to 1/21 for suicidal ideation.  She was discharged yesterday and drank 2 beers and did cocaine.  She now presents with SI with a plan to jump in front of a bus.  No HI or AVH.  On evaluation, vital signs stable, well-appearing no apparent distress.  Based on my evaluation at this point, there are no signs, symptoms or complaints of an acute medical problem. The patient was placed on a precautionary hold given the concerning nature of the presentation. Will obtain psychiatric screening workup. I will discuss the case with the on-call psychiatric provider as I feel that the patient should be seen and evaluated by psychiatry before leaving the emergency department.      Functional versus less likely organic cause of patients suicidal ideation, no recent fevers or chills to suggest infectious etiology such as meningitis/encephalitis, well-appearing, nontoxic, no meningeal signs. Less likely toxidrome versus metabolic and/or endocrine cause of patient's suicidal ideation given the patients history of presenting illness. Patient is well-appearing. Plan is to get labs, psychiatry evaluation and disposition.  Will order patient's home medications including Tivicay, Descovy, gabapentin, estradiol, and Aldactone.        ED Course:    Labs reviewed, reveals EtOH 81.  Patient medically clear. Psychiatry consulted. See PES note for disposition.      I have discussed the case with the Attending Physician, Dr. Norlene Campbell who was in agreement with the plan as described above. ____________________________________________    Time seen: June 08, 2018 3:36 AM     History     CHIEF COMPLAINT:   Chief Complaint   Patient presents with   ??? Suicidal   ??? Psychiatric Evaluation       HPI:   Ryan Robinson is a 43 y.o. adult with PMH of substance-induced mood, depression, polysubstance abuse, HIV presents to the ED for evaluation of suicidal ideation.  Patient recently was inpatient here at [facility] from 1/17 to 1/21 for suicidal ideation.  She was deemed suitable for discharge yesterday due to lack of acute safety concerns and ongoing denial of suicidal or homicidal ideation.  After discharge, she notes she was trying to catch the bus to [place].  She reports that someone brought her alcohol and she drank 2 beers.  She also notes doing a hit of cocaine.  She currently endorses suicidal ideation with a plan to get hit by a bus.  She denies HI or AVH.  She denies any medical complaints at this time.    ____________________________________________    PAST MEDICAL HISTORY/PAST SURGICAL HISTORY:   Past Medical History:   Diagnosis Date   ??? ADHD (attention deficit hyperactivity disorder)    ??? Alcoholism (CMS-HCC)    ??? Anorexia nervosa    ??? Anxiety    ??? Bipolar disorder (CMS-HCC)    ??? Cocaine use disorder, moderate, dependence (CMS-HCC) 08/08/2017   ??? Depression    ??? Depressive disorder    ??? HIV (human immunodeficiency virus infection) (CMS-HCC)    ??? Migraines    ??? Panic  disorder    ??? R/O Borderline personality disorder 12/02/2015   ??? Seizures (CMS-HCC)    ??? Substance abuse (CMS-HCC)    ??? Substance Use Treatment History        MEDICATIONS:     Current Facility-Administered Medications:   ???  dolutegravir (TIVICAY) TABLET Tab 50 mg, 50 mg, Oral, Daily, Jacelyn Grip, MD  ???  emtricitabine-tenofovir alafen (Descovy) 200-25 mg tablet 1 tablet, 1 tablet, Oral, Daily, Jacelyn Grip, MD  ???  estradiol (ESTRACE) tablet 4 mg, 4 mg, Oral, Daily, Jacelyn Grip, MD  ???  gabapentin (NEURONTIN) capsule 1,200 mg, 1,200 mg, Oral, TID, Jacelyn Grip, MD  ???  spironolactone (ALDACTONE) tablet 100 mg, 100 mg, Oral, Daily, Jacelyn Grip, MD    Current Outpatient Medications:   ???  albuterol (PROVENTIL HFA;VENTOLIN HFA) 90 mcg/actuation inhaler, Inhale 2 puffs every four (4) hours as needed for wheezing., Disp: 1 Inhaler, Rfl: 0  ???  buprenorphine-naloxone (SUBOXONE) 8-2 mg sublingual film, Dissolve 2 & 1/2 films under the tongue daily., Disp: 10 Film, Rfl: 0  ???  buPROPion (WELLBUTRIN XL) 150 MG 24 hr tablet, Take 1 tablet (150 mg) by mouth daily with 300 mg tablet for a total daily dose of 450 mg, Disp: 30 tablet, Rfl: 0  ???  buPROPion (WELLBUTRIN XL) 300 MG 24 hr tablet, Take 1 tablet (300 mg) by mouth daily with 150 mg tablet for a total daily dose of 450 mg, Disp: 30 tablet, Rfl: 0  ???  cloNIDine HCl (CATAPRES) 0.1 MG tablet, Take 1/2-1 tablet by mouth twice daily as needed for anxiety., Disp: , Rfl:   ???  dolutegravir (TIVICAY) 50 mg Tab TABLET, Take 1 tablet (50 mg total) by mouth daily., Disp: 30 tablet, Rfl: 0  ???  emtricitabine-tenofovir alafen (DESCOVY) 200-25 mg tablet, Take 1 tablet by mouth daily., Disp: 30 tablet, Rfl: 0  ???  estradiol (ESTRACE) 2 MG tablet, Take 4 mg by mouth Two (2) times a day., Disp: , Rfl:   ???  gabapentin (NEURONTIN) 300 MG capsule, TAKE 4 CAPSULES (1200 MG) BY MOUTH THREE TIMES DAILY FOR PAIN, Disp: 360 capsule, Rfl: 9  ???  hydroCHLOROthiazide (HYDRODIURIL) 25 MG tablet, TAKE 1 TABLET BY MOUTH ONCE DAILY IN THE MORNING ( USED FOR HIGH BLOOD PRESSURE ), Disp: 30 tablet, Rfl: 9  ???  prazosin (MINIPRESS) 1 MG capsule, Take 3 capsules (3 mg total) by mouth nightly., Disp: 90 capsule, Rfl: 0  ???  spironolactone (ALDACTONE) 100 MG tablet, Take 1 tablet (100 mg total) by mouth daily., Disp: 30 tablet, Rfl: 0  ???  tamsulosin (FLOMAX) 0.4 mg capsule, Take 1 capsule (0.4 mg total) by mouth nightly., Disp: 90 capsule, Rfl: 3    ALLERGIES:   Abilify [aripiprazole]; Amoxicillin; Penicillins; Toradol [ketorolac]; and Ultram [tramadol]    SOCIAL HISTORY:   Social History     Tobacco Use   ??? Smoking status: Current Every Day Smoker     Packs/day: 1.00     Years: 20.00     Pack years: 20.00     Types: Cigarettes   ??? Smokeless tobacco: Never Used   Substance Use Topics   ??? Alcohol use: Yes     Comment: 3-4 drinks of 24oz daily       FAMILY HISTORY:  Family History   Problem Relation Age of Onset   ??? Bipolar disorder Mother    ??? ADD / ADHD Mother    ??? Anxiety disorder Mother    ???  Depression Mother    ??? Drug abuse Mother    ??? Anxiety disorder Father    ??? Anxiety disorder Sister    ??? Bipolar disorder Sister    ??? Depression Sister    ??? Paranoid behavior Sister    ??? Seizures Sister    ??? Drug abuse Brother    ??? Bipolar disorder Maternal Aunt    ??? Depression Maternal Aunt    ??? Bipolar disorder Cousin    ??? Depression Cousin    ??? Drug abuse Cousin         ____________________________________________    Review of Systems  See HPI  Constitutional: no fever, no chills   Eyes: no drainage   ENT: no nasal congestion, no sore throat  Cardiovascular:  no chest pain   Resp: no SOB, no cough   Gastrointestinal: no nausea, no vomiting, no diarrhea  Genitourinary: no dysuria  Integumentary: no rash   Allergy: no hives  Musculoskeletal: no leg swelling, no joint pain   Neurological: no slurred speech, no headache  A 10 point review of systems was performed and is negative other than positive elements noted in HPI     Physical Exam     VITAL SIGNS:    There were no vitals taken for this visit.    Constitutional: Alert and oriented. Well appearing and in no distress.  Head: Normocephalic and atraumatic  Eyes: EOMI, PERRL, conjunctiva normal  ENT: External ears normal, bilateral nares clear, MMM  Cardiovascular: Normal rate, regular rhythm. Normal and symmetric distal pulses are present in all extremities.  Respiratory: Normal respiratory effort. Breath sounds are normal.  Gastrointestinal: Soft, non-distended and nontender. There is no CVA tenderness. Musculoskeletal: Nontender with normal range of motion in all extremities.       Right lower leg: No tenderness or edema.       Left lower leg: No tenderness or edema.  Neurologic: Normal speech and language. No gross focal neurologic deficits are appreciated.  Skin: Skin is warm, dry and intact. No rash noted.  Psychiatric: Mood and affect are normal. Speech and behavior are normal.      EKG      Name: MANDO BLATZ   Age: 43 y.o.   Gender: adult    No results found for this visit on 06/08/18 (from the past 4464 hour(s)).       Radiology     No orders to display           Pertinent labs & imaging results that were available during my care of the patient were reviewed by me and considered in my medical decision making. Labs and radiology studies included in my note may not constitute all ordered/reviewied labs during this encounter (see chart for details).    Portions of this record have been created using Scientist, clinical (histocompatibility and immunogenetics). Dictation errors have been sought, but may not have been identified and corrected.     Jacelyn Grip, MD  Resident  06/08/18 [phone]

## 2018-06-08 NOTE — Unmapped (Signed)
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 judgement, since no study has identified one specific risk factor or set of risk factors as specifically predictive of suicide or other suicidal behavior.  From The American Psychiatric Practice Guidelines for the Assessment and Treatment of Patients with Suicidal Behaviors, page 24.    Risk Stratification based on my safety assessment TRIAGE/Interventions   Moderate Suicide Risk Continue routine safety screenings and follow facility protocol     Step 5: Documentation    Clinical Observation and Risk Level:??Based on my clinical assessment of CALLAWAY HAILES, I believe she represents a??Moderate Suicide Risk in the current clinical setting.  ??  Clinical Note  ??  Relevant Mental Status Information:  See mental status exam above   ??  Methods of??Suicide??Risk Evaluation:??I have reviewed the chart, interviewed her and asked about ideation, intent, plan, and suicidal behaviors (step three), completed a mental status examination, asked about the presence of firearms, and taken into consideration the above??suicide??risk (step one) and protective factors (step two) as I completed my overall risk assessment.  ??  Brief Evaluation Summary    Collateral Sources Used and Relevant Information Obtained: the patient  Specific Assessment Data to Support Risk Determination: See methods of??suicide??risk evaluation as documented above.  Rationale for Actions Taken and Not Taken: The patient was scored as High Risk (06/08/18 0341) on the initial [place]??Suicide??Severity Rating done by the nursing staff. It is my clinical judgment that her observation level can safely be decreased to q15 min checks at this time. This will be reassessed if there is a clinically significant change in the status of the patient. This judgment is based on our ability to directly address??suicide??risk, implement??suicide??prevention strategies and develop a safety plan while she is in the clinical setting.
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
## 2018-06-09 NOTE — Unmapped (Signed)
Suboxone prescription picked up from inpatient pharmacy by RN and given back to patient. 6 suboxone films in Ziploc bag, handed to patient.

## 2018-06-14 ENCOUNTER — Ambulatory Visit
Admit: 2018-06-14 | Discharge: 2018-06-14 | Disposition: A | Discharge status: Home / Self Care | Payer: MEDICARE | Attending: Emergency Medicine

## 2018-06-14 ENCOUNTER — Emergency Department
Admit: 2018-06-14 | Discharge: 2018-06-14 | Disposition: A | Discharge status: Home / Self Care | Payer: MEDICARE | Attending: Emergency Medicine

## 2018-06-14 DIAGNOSIS — Z0441 Encounter for examination and observation following alleged adult rape: Principal | ICD-10-CM

## 2018-06-14 MED ORDER — METRONIDAZOLE 500 MG TABLET
ORAL_TABLET | Freq: Once | ORAL | 0 refills | 0 days | Status: CP
Start: 2018-06-14 — End: 2018-06-14

## 2018-06-14 NOTE — Unmapped (Signed)
[facility] contacted for patient

## 2018-06-14 NOTE — Unmapped (Signed)
Patient rounds completed. The following patient needs were addressed:  Pain, Toileting, Personal Belongings, Plan of Care, Call Bell in Reach and Bed Position Low .

## 2018-06-14 NOTE — Unmapped (Signed)
ED Progress Note    Spoke with the nurse.  Requests 2 g dose of metronidazole that patient can take after discharge.  Prescribed this so that it will be in her discharge note.

## 2018-06-14 NOTE — Unmapped (Signed)
.  Patient rounding complete, call bell in reach, bed locked and in lowest position, patient belongings at bedside and within reach of patient.  Patient updated on plan of care.      Patient appears sleeping quietly.

## 2018-06-14 NOTE — Unmapped (Signed)
Bed: 39-C  Expected date:   Expected time:   Means of arrival:   Comments:  pelvic

## 2018-06-14 NOTE — Unmapped (Signed)
Patient rounds completed. The following patient needs were addressed:  Pain, Personal Belongings, Plan of Care, Call Bell in Reach and Bed Position Low .  Patient resting, awaiting SANE.

## 2018-06-14 NOTE — Unmapped (Signed)
Patient left out the room cursing , pt at this time exited the hospital
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
## 2018-06-14 NOTE — Unmapped (Signed)
Patient rounds completed. The following patient needs were addressed:  Pain, Personal Belongings, Plan of Care, Call Bell in Reach and Bed Position Low .  Physician at bedside.  Awaiting SANE RN.
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
## 2018-06-14 NOTE — Unmapped (Signed)
Patient provided with lists of local community resources and contact information for [facility].

## 2018-07-18 NOTE — Unmapped (Signed)
The [facility] has made a third and final attempt to reach this patient to refill the following medication:Tivicay and Descovy.      We have Left voicemails on the following phone numbers: [phone].    Dates contacted: 07/05/2018, 07/12/2018, 07/18/2018  Last scheduled delivery: 06/07/2018    The patient may be at risk of non-compliance with this medication. The patient should call the [facility] at [phone] (option 4) to refill medication.    [name]. [place], [place].[name]  Specialty Pharmacist  [phone] option 4

## 2018-08-02 DIAGNOSIS — F319 Bipolar disorder, unspecified: Principal | ICD-10-CM

## 2018-08-02 DIAGNOSIS — Z21 Asymptomatic human immunodeficiency virus [HIV] infection status: Principal | ICD-10-CM

## 2018-08-02 DIAGNOSIS — Z885 Allergy status to narcotic agent status: Principal | ICD-10-CM

## 2018-08-02 DIAGNOSIS — F17210 Nicotine dependence, cigarettes, uncomplicated: Principal | ICD-10-CM

## 2018-08-02 DIAGNOSIS — B20 Human immunodeficiency virus [HIV] disease: Principal | ICD-10-CM

## 2018-08-02 DIAGNOSIS — F640 Transsexualism: Principal | ICD-10-CM

## 2018-08-02 DIAGNOSIS — Z79899 Other long term (current) drug therapy: Principal | ICD-10-CM

## 2018-08-02 DIAGNOSIS — F1490 Cocaine use, unspecified, uncomplicated: Principal | ICD-10-CM

## 2018-08-02 DIAGNOSIS — F909 Attention-deficit hyperactivity disorder, unspecified type: Principal | ICD-10-CM

## 2018-08-02 DIAGNOSIS — Z79891 Long term (current) use of opiate analgesic: Principal | ICD-10-CM

## 2018-08-02 DIAGNOSIS — Z813 Family history of other psychoactive substance abuse and dependence: Principal | ICD-10-CM

## 2018-08-02 DIAGNOSIS — R05 Cough: Principal | ICD-10-CM

## 2018-08-02 DIAGNOSIS — R5381 Other malaise: Principal | ICD-10-CM

## 2018-08-02 DIAGNOSIS — Z818 Family history of other mental and behavioral disorders: Principal | ICD-10-CM

## 2018-08-02 DIAGNOSIS — Z880 Allergy status to penicillin: Principal | ICD-10-CM

## 2018-08-02 DIAGNOSIS — Z96641 Presence of right artificial hip joint: Principal | ICD-10-CM

## 2018-08-02 DIAGNOSIS — F410 Panic disorder [episodic paroxysmal anxiety] without agoraphobia: Principal | ICD-10-CM

## 2018-08-02 DIAGNOSIS — R569 Unspecified convulsions: Principal | ICD-10-CM

## 2018-08-02 DIAGNOSIS — R197 Diarrhea, unspecified: Principal | ICD-10-CM

## 2018-08-03 ENCOUNTER — Emergency Department: Admit: 2018-08-03 | Discharge: 2018-08-03 | Disposition: A | Discharge status: Home / Self Care | Payer: MEDICARE

## 2018-08-03 ENCOUNTER — Ambulatory Visit: Admit: 2018-08-03 | Discharge: 2018-08-03 | Disposition: A | Discharge status: Home / Self Care | Payer: MEDICARE

## 2018-08-03 DIAGNOSIS — F319 Bipolar disorder, unspecified: Principal | ICD-10-CM

## 2018-08-03 DIAGNOSIS — Z21 Asymptomatic human immunodeficiency virus [HIV] infection status: Principal | ICD-10-CM

## 2018-08-03 DIAGNOSIS — R5381 Other malaise: Principal | ICD-10-CM

## 2018-08-03 DIAGNOSIS — F909 Attention-deficit hyperactivity disorder, unspecified type: Principal | ICD-10-CM

## 2018-08-03 DIAGNOSIS — F1490 Cocaine use, unspecified, uncomplicated: Principal | ICD-10-CM

## 2018-08-03 DIAGNOSIS — Z79891 Long term (current) use of opiate analgesic: Principal | ICD-10-CM

## 2018-08-03 DIAGNOSIS — Z79899 Other long term (current) drug therapy: Principal | ICD-10-CM

## 2018-08-03 DIAGNOSIS — Z96641 Presence of right artificial hip joint: Principal | ICD-10-CM

## 2018-08-03 DIAGNOSIS — R197 Diarrhea, unspecified: Principal | ICD-10-CM

## 2018-08-03 DIAGNOSIS — R05 Cough: Principal | ICD-10-CM

## 2018-08-03 DIAGNOSIS — R569 Unspecified convulsions: Principal | ICD-10-CM

## 2018-08-03 DIAGNOSIS — Z880 Allergy status to penicillin: Principal | ICD-10-CM

## 2018-08-03 DIAGNOSIS — F17210 Nicotine dependence, cigarettes, uncomplicated: Principal | ICD-10-CM

## 2018-08-03 DIAGNOSIS — F640 Transsexualism: Principal | ICD-10-CM

## 2018-08-03 DIAGNOSIS — Z813 Family history of other psychoactive substance abuse and dependence: Principal | ICD-10-CM

## 2018-08-03 DIAGNOSIS — Z818 Family history of other mental and behavioral disorders: Principal | ICD-10-CM

## 2018-08-03 DIAGNOSIS — F410 Panic disorder [episodic paroxysmal anxiety] without agoraphobia: Principal | ICD-10-CM

## 2018-08-03 DIAGNOSIS — Z885 Allergy status to narcotic agent status: Principal | ICD-10-CM

## 2018-08-03 LAB — COMPREHENSIVE METABOLIC PANEL
ALBUMIN: 4.3 g/dL (ref 3.5–5.0)
ALKALINE PHOSPHATASE: 66 U/L (ref 38–126)
ALT (SGPT): 22 U/L (ref <35)
ANION GAP: 16 mmol/L — ABNORMAL HIGH (ref 7–15)
AST (SGOT): 39 U/L (ref 17–47)
BILIRUBIN TOTAL: 0.6 mg/dL (ref 0.0–1.2)
BLOOD UREA NITROGEN: 11 mg/dL (ref 7–21)
BUN / CREAT RATIO: 13
BUN / CREAT RATIO: 13 mmol/L — ABNORMAL LOW (ref 22.0–30.0)
CALCIUM: 9.1 mg/dL (ref 8.5–10.2)
CHLORIDE: 104 mmol/L (ref 98–107)
CO2: 21 mmol/L — ABNORMAL LOW (ref 22.0–30.0)
CREATININE: 0.83 mg/dL (ref 0.65–1.15)
EGFR CKD-EPI AA FEMALE: >90 mL/min/{1.73_m2} (ref >=60)
EGFR CKD-EPI AA MALE: >90 mL/min/{1.73_m2} (ref >=60)
EGFR CKD-EPI NON-AA FEMALE: 87 mL/min/{1.73_m2} (ref >=60)
EGFR CKD-EPI NON-AA MALE: >90 mL/min/{1.73_m2} (ref >=60)
GLUCOSE RANDOM: 86 mg/dL (ref 70–179)
POTASSIUM: 4 mmol/L (ref 3.5–5.0)
PROTEIN TOTAL: 7.8 g/dL (ref 6.5–8.3)
SODIUM: 141 mmol/L (ref 135–145)

## 2018-08-03 LAB — HIV RNA, QUANTITATIVE, PCR
HIV RNA COMMENT: 4.19 log copies/mL — ABNORMAL HIGH (ref <0.00)
HIV RNA LOG(10): 4.19 {Log_copies}/mL — ABNORMAL HIGH (ref <0.00)

## 2018-08-03 LAB — URINALYSIS WITH CULTURE REFLEX
BILIRUBIN UA: NEGATIVE
BLOOD UA: NEGATIVE /HPF — AB (ref 5.0–9.0)
GLUCOSE UA: NEGATIVE
KETONES UA: NEGATIVE
NITRITE UA: NEGATIVE
PH UA: 6 (ref 5.0–9.0)
PROTEIN UA: NEGATIVE
RBC UA: <1 /HPF (ref <=3)
SPECIFIC GRAVITY UA: 1.003 (ref 1.003–1.030)
SQUAMOUS EPITHELIAL: <1 /HPF (ref 0–5)
UROBILINOGEN UA: 0.2
WBC UA: 2 /HPF (ref <=2)

## 2018-08-03 LAB — CBC W/ AUTO DIFF
BASOPHILS ABSOLUTE COUNT: 0.1 10*9/L (ref 0.0–0.1)
BASOPHILS RELATIVE PERCENT: 0.9 %
EOSINOPHILS RELATIVE PERCENT: 1.2 %
HEMATOCRIT: 48.7 % — ABNORMAL HIGH (ref 41.0–46.0)
HEMOGLOBIN: 16 g/dL (ref 13.5–16.0)
LARGE UNSTAINED CELLS: 2 % (ref 0–4)
LYMPHOCYTES ABSOLUTE COUNT: 3.4 10*9/L — ABNORMAL HIGH (ref 1.5–5.0)
LYMPHOCYTES RELATIVE PERCENT: 42.6 %
MEAN CORPUSCULAR HEMOGLOBIN CONC: 32.8 g/dL (ref 31.0–37.0)
MEAN CORPUSCULAR HEMOGLOBIN: 31.2 pg (ref 26.0–34.0)
MEAN CORPUSCULAR VOLUME: 95.1 fL (ref 80.0–100.0)
MEAN PLATELET VOLUME: 8.7 fL (ref 7.0–10.0)
MONOCYTES ABSOLUTE COUNT: 0.6 10*9/L (ref 0.2–0.8)
MONOCYTES RELATIVE PERCENT: 7.6 %
NEUTROPHILS ABSOLUTE COUNT: 3.6 10*9/L (ref 2.0–7.5)
NEUTROPHILS RELATIVE PERCENT: 45.8 %
PLATELET COUNT: 227 10*9/L (ref 150–440)
RED BLOOD CELL COUNT: 5.12 10*12/L (ref 4.50–5.20)
WBC ADJUSTED: 7.9 10*9/L (ref 4.5–11.0)

## 2018-08-03 LAB — LYMPH MARKER LIMITED,FLOW
ABSOLUTE CD3 CNT: 3131 {cells}/uL (ref 915–3400)
ABSOLUTE CD4 CNT: 142 {cells}/uL — ABNORMAL LOW (ref 510–2320)
ABSOLUTE CD8 CNT: 2918 {Cells}/uL — ABNORMAL HIGH (ref 180–1520)
CD3% (T CELLS)": 88 % — ABNORMAL HIGH (ref 61–86)
CD4% (T HELPER)": 4 % — ABNORMAL LOW (ref 34–58)
CD4:CD8 RATIO: <0.1 — ABNORMAL LOW (ref 0.9–4.8)

## 2018-08-03 NOTE — Unmapped (Signed)
[facility]  Emergency Department Provider Note      ED Clinical Impression     Final diagnoses:   None       Initial Impression, ED Course, Assessment and Plan     Impression: Ryan Robinson is a 43 y.o. adult male converted to male with past medical history of HIV not currently on antiviral therapy, seizure disorder, bipolar disorder, anxiety, ADHD, panic disorder, alcoholism, and migraines who presents today for multiple complaints including cough, shortness of breath, body aches, intermittent diarrhea/constipation.  Patient well-appearing and in no acute distress.  Initial vital signs are all within normal limits.  Patient is noted to have a dry, nonproductive cough, regular cardiac rate and rhythm with palpable symmetric radial pulses, lungs clear to auscultation without wheeze, crackle, or rhonchi, abdomen soft with mild diffuse tenderness though no guarding or rebound tenderness.  Patient has no rash, no peripheral edema, no skin lesions, no unilateral leg swelling or calf tenderness or palpable cord or discoloration.  Given patient's HIV status, prior CD4 count of 222 on 06/03/2018, and not taking anti-retrovirals for past few months I do have significant concern that patient may have infectious process though given patient's multiple symptoms the possibilities of infectious source is vast.  Will obtain chest x-ray, CBC, CMP, respiratory pathogen panel, CD4, HIV viral load, CXR.     ED Course as of Aug 02 321   Wed Aug 03, 2018   0106 No acute airspace disease.   XR Chest 1 view Portable   0106 Troponin I: <0.034   0115 Electrolytes reassuring though anion gap 16, could be related to diarrhea which patient reported. Will give liter of IVF      0319 Absolute Lymphocytes: 3.4   0319 Absolute Neutrophils: 3.6   0319 WBC: 7.9   0319 No signs of anemia      0319 No signs of UTI      0319 Respiratory pathogen panel negative        I have reassessed patient.  Vital signs still within normal limits. She states she feels slightly better after the IV fluids however states she does not feel totally well.    Given patient's normal vital signs and reassuring work-up I do not see reason for admission at this time.  As patient does not have fever or close contact with COVID individuals there is no reason for COVID testing at this time.  I have emphasized with the patient the importance of presenting to infectious disease clinic for further assessment and antiretroviral medications.  I have encouraged her to call the clinic today and have provided her with the phone number.  She has been given return precautions will be discharged at this time in stable condition.      Additional Medical Decision Making     I independently visualized the EKG tracing.   I independently visualized the radiology images.   I reviewed the patient's prior medical records.     Labs and radiology results that were available during my care of the patient were independently reviewed by me and considered in my medical decision making.    Portions of this record have been created using Scientist, clinical (histocompatibility and immunogenetics). Dictation errors have been sought, but may not have been identified and corrected.  ____________________________________________    I have reviewed the triage vital signs and the nursing notes.     History     Chief Complaint  Cough      HPI  Ryan Robinson is a 43 y.o. adult male converted to male with past medical history of HIV not currently on antiviral therapy, seizure disorder, bipolar disorder, anxiety, ADHD, panic disorder, alcoholism, and migraines who presents today for multiple complaints.  Patient states that she has felt poorly for 2 weeks, though is unable to elaborate on this feeling further. She states she has had a nonproductive cough for the past 4 days as well as shortness of breath, night sweats, body aches, intermittent abdominal pain.  She also endorses intermittent diarrhea and constipation. She is unsure whether she has had fever or not.  She is unsure if she has had any sick contact exposures though states she does hang out with homeless people who have been coughing. Patient states she has not taken her antiviral medications in roughly 4 months.  She does report narcotic dependence, takes Suboxone daily, including today. Patient also admits to intermittent cocaine use.  Patient had been taking hormones for male > male gender transformation however had not taken them for a few months now.       Past Medical History:   Diagnosis Date   ??? ADHD (attention deficit hyperactivity disorder)    ??? Alcoholism (CMS-HCC)    ??? Anorexia nervosa    ??? Anxiety    ??? Bipolar disorder (CMS-HCC)    ??? Cocaine use disorder, moderate, dependence (CMS-HCC) 08/08/2017   ??? Depression    ??? Depressive disorder    ??? HIV (human immunodeficiency virus infection) (CMS-HCC)    ??? Migraines    ??? Panic disorder    ??? R/O Borderline personality disorder 12/02/2015   ??? Seizures (CMS-HCC)    ??? Substance abuse (CMS-HCC)    ??? Substance Use Treatment History        Patient Active Problem List   Diagnosis   ??? HIV (human immunodeficiency virus infection) (CMS-HCC)   ??? Major depression, recurrent, severe without psychosis   ??? Substance induced mood disorder (CMS-HCC)   ??? Alcohol use disorder, moderate, dependence (CMS-HCC)   ??? Essential hypertension (RAF-HCC)   ??? R/O Borderline personality disorder   ??? Urethral stricture   ??? 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 Cigarette nicotine dependence without complication   ??? Acute cystitis   ??? 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 Cocaine use disorder, severe    ??? Acute stress disorder       Past Surgical History:   Procedure Laterality Date   ??? HIP ARTHROPLASTY     ??? JOINT REPLACEMENT  2011    right hip replacement 2/2 AVN       No current facility-administered medications for this encounter.     Current Outpatient Medications:   ???  albuterol (PROVENTIL HFA;VENTOLIN HFA) 90 mcg/actuation inhaler, Inhale 2 puffs every four (4) hours as needed for wheezing., Disp: 1 Inhaler, Rfl: 0  ???  buPROPion (WELLBUTRIN XL) 150 MG 24 hr tablet, Take 1 tablet (150 mg) by mouth daily with 300 mg tablet for a total daily dose of 450 mg, Disp: 30 tablet, Rfl: 0  ???  buPROPion (WELLBUTRIN XL) 300 MG 24 hr tablet, Take 1 tablet (300 mg) by mouth daily with 150 mg tablet for a total daily dose of 450 mg, Disp: 30 tablet, Rfl: 0  ???  cloNIDine HCl (CATAPRES) 0.1 MG tablet, Take 1/2-1 tablet by mouth twice daily as needed for anxiety., Disp: , Rfl:   ???  dolutegravir (TIVICAY) 50 mg Tab TABLET, Take  1 tablet (50 mg total) by mouth daily., Disp: 30 tablet, Rfl: 0  ???  estradiol (ESTRACE) 2 MG tablet, Take 4 mg by mouth Two (2) times a day., Disp: , Rfl:   ???  gabapentin (NEURONTIN) 300 MG capsule, TAKE 4 CAPSULES (1200 MG) BY MOUTH THREE TIMES DAILY FOR PAIN, Disp: 360 capsule, Rfl: 9  ???  hydroCHLOROthiazide (HYDRODIURIL) 25 MG tablet, TAKE 1 TABLET BY MOUTH ONCE DAILY IN THE MORNING ( USED FOR HIGH BLOOD PRESSURE ), Disp: 30 tablet, Rfl: 9  ???  prazosin (MINIPRESS) 1 MG capsule, Take 3 capsules (3 mg total) by mouth nightly., Disp: 90 capsule, Rfl: 0  ???  spironolactone (ALDACTONE) 100 MG tablet, Take 1 tablet (100 mg total) by mouth daily., Disp: 30 tablet, Rfl: 0  ???  tamsulosin (FLOMAX) 0.4 mg capsule, Take 1 capsule (0.4 mg total) by mouth nightly., Disp: 90 capsule, Rfl: 3    Allergies  Abilify [aripiprazole]; Amoxicillin; Penicillins; Toradol [ketorolac]; and Ultram [tramadol]    Family History   Problem Relation Age of Onset   ??? Bipolar disorder Mother    ??? ADD / ADHD Mother    ??? Anxiety disorder Mother    ??? Depression Mother    ??? Drug abuse Mother    ??? Anxiety disorder Father    ??? Anxiety disorder Sister    ??? Bipolar disorder Sister    ??? Depression Sister    ??? Paranoid behavior Sister    ??? Seizures Sister    ??? Drug abuse Brother    ??? Bipolar disorder Maternal Aunt    ??? Depression Maternal Aunt    ??? Bipolar disorder Cousin    ??? Depression Cousin    ??? Drug abuse Cousin        Social History  Social History     Tobacco Use   ??? Smoking status: Current Every Day Smoker     Packs/day: 1.00     Years: 20.00     Pack years: 20.00     Types: Cigarettes   ??? Smokeless tobacco: Never Used   Substance Use Topics   ??? Alcohol use: Yes     Comment: 3-4 drinks of 24oz daily   ??? Drug use: Yes     Types: Crack cocaine, Marijuana     Comment: clean almost one year       Review of Systems    Constitutional: unsure about fever.  ENT: Negative for sore throat.  Cardiovascular: Negative for chest pain.  Respiratory: + shortness of breath and cough  GI: + intermittent diarrhea and constipation     A 10 point review of systems was conducted, all other systems have been reviewed and are negative except as otherwise documented.    Physical Exam     ED Triage Vitals [08/02/18 2347]   Enc Vitals Group      BP 142/103      Pulse       SpO2 Pulse 93      Resp       Temp 37.1 ??C (98.7 ??F)      Temp Source Oral      SpO2 96 %      Weight       Height       Head Circumference       Peak Flow       Pain Score       Pain Loc       Pain Edu?  Excl. in GC?        Constitutional: Alert and oriented. Well appearing and in no distress. Male to male gender change  Eyes: Conjunctivae are normal.  ENT       Head: Normocephalic and atraumatic.       Nose: No congestion.       Mouth/Throat: Mucous membranes are moist.       Neck: No stridor.  Cardiovascular: Normal rate, regular rhythm.   Respiratory: Normal respiratory effort. Breath sounds are normal. + nonproductive cough  Gastrointestinal: Soft, mild generalized tenderness, no guarding or rebound tenderness. There is no CVA tenderness.  Musculoskeletal: Normal range of motion in all extremities.       Lower extremities are without tenderness, discoloration, or edema.  Neurologic: Normal speech and language. No gross focal neurologic deficits are appreciated.  Skin: Skin is warm, dry and intact. No rash noted.  Psychiatric: Mood and affect are normal. Speech and behavior are normal.    EKG     Normal sinus rhythm, normal axis, normal PR, QRS intervals, no significant ST elevation or depression however patient does have abnormal ST morphology in V2 which is also present on prior EKGs.    Radiology   Xr Chest 1 View Portable    Result Date: 08/03/2018  EXAM: XR CHEST PORTABLE DATE: 08/03/2018 12:36 AM ACCESSION: [id number] UN DICTATED: 08/03/2018 12:36 AM INTERPRETATION LOCATION: [facility]     CLINICAL INDICATION: 43 years old Male with COUGH      COMPARISON: 02/18/2018     TECHNIQUE: Portable Chest Radiograph.     FINDINGS:         Lungs radiographically clear.     No pleural effusion or pneumothorax     Stable cardiomediastinal silhouette.                 No acute airspace disease.        Procedures     Procedure(s) performed: None.           Luther Redo, MD  Resident  08/03/18 [phone]

## 2018-08-03 NOTE — Unmapped (Signed)
PT here with persistent cough with chest tightness. NO reported fever. Coughing on staff and has to be redirected several times on keeping mask on.

## 2018-08-03 NOTE — Unmapped (Signed)
LYMPH MARKER LIMITED,FLOW   Order: [id number] - Part of Panel Order [id number]   Status:  Final result ????    Sent to provider for review and action

## 2018-08-04 NOTE — Unmapped (Signed)
HIV RNA, Quantitative, PCR   Order: [id number]   Status:  Final result ????    Sent to provider for review and eval- will be followed by ID as well

## 2018-08-06 ENCOUNTER — Ambulatory Visit
Admit: 2018-08-06 | Discharge: 2018-08-07 | Disposition: A | Discharge status: Home / Self Care | Payer: MEDICARE | Attending: Emergency Medicine

## 2018-08-06 ENCOUNTER — Emergency Department
Admit: 2018-08-06 | Discharge: 2018-08-07 | Disposition: A | Discharge status: Home / Self Care | Payer: MEDICARE | Attending: Emergency Medicine

## 2018-08-06 DIAGNOSIS — Z79899 Other long term (current) drug therapy: Principal | ICD-10-CM

## 2018-08-06 DIAGNOSIS — F640 Transsexualism: Principal | ICD-10-CM

## 2018-08-06 DIAGNOSIS — Z21 Asymptomatic human immunodeficiency virus [HIV] infection status: Principal | ICD-10-CM

## 2018-08-06 DIAGNOSIS — F1410 Cocaine abuse, uncomplicated: Principal | ICD-10-CM

## 2018-08-06 DIAGNOSIS — F1020 Alcohol dependence, uncomplicated: Principal | ICD-10-CM

## 2018-08-06 DIAGNOSIS — R45851 Suicidal ideations: Principal | ICD-10-CM

## 2018-08-06 DIAGNOSIS — F1420 Cocaine dependence, uncomplicated: Principal | ICD-10-CM

## 2018-08-06 DIAGNOSIS — F909 Attention-deficit hyperactivity disorder, unspecified type: Principal | ICD-10-CM

## 2018-08-06 DIAGNOSIS — I10 Essential (primary) hypertension: Principal | ICD-10-CM

## 2018-08-06 DIAGNOSIS — F332 Major depressive disorder, recurrent severe without psychotic features: Principal | ICD-10-CM

## 2018-08-06 DIAGNOSIS — G43909 Migraine, unspecified, not intractable, without status migrainosus: Principal | ICD-10-CM

## 2018-08-06 DIAGNOSIS — Z880 Allergy status to penicillin: Principal | ICD-10-CM

## 2018-08-06 DIAGNOSIS — R05 Cough: Principal | ICD-10-CM

## 2018-08-06 DIAGNOSIS — Z96641 Presence of right artificial hip joint: Principal | ICD-10-CM

## 2018-08-06 DIAGNOSIS — F17210 Nicotine dependence, cigarettes, uncomplicated: Principal | ICD-10-CM

## 2018-08-06 DIAGNOSIS — J40 Bronchitis, not specified as acute or chronic: Principal | ICD-10-CM

## 2018-08-06 LAB — CBC W/ AUTO DIFF
BASOPHILS RELATIVE PERCENT: 0.2 %
EOSINOPHILS ABSOLUTE COUNT: 0 10*9/L (ref 0.0–0.4)
EOSINOPHILS RELATIVE PERCENT: 0.6 %
HEMATOCRIT: 52.4 % — ABNORMAL HIGH (ref 41.0–46.0)
HEMOGLOBIN: 17.1 g/dL — ABNORMAL HIGH (ref 13.5–16.0)
LARGE UNSTAINED CELLS: 1 % (ref 0–4)
LYMPHOCYTES ABSOLUTE COUNT: 1.9 10*9/L (ref 1.5–5.0)
LYMPHOCYTES RELATIVE PERCENT: 23.6 %
MEAN CORPUSCULAR HEMOGLOBIN CONC: 32.6 g/dL (ref 31.0–37.0)
MEAN CORPUSCULAR HEMOGLOBIN: 30.9 pg (ref 26.0–34.0)
MEAN CORPUSCULAR VOLUME: 95 fL (ref 80.0–100.0)
MONOCYTES ABSOLUTE COUNT: 0.4 10*9/L (ref 0.2–0.8)
MONOCYTES RELATIVE PERCENT: 5 %
NEUTROPHILS ABSOLUTE COUNT: 5.6 10*9/L (ref 2.0–7.5)
NEUTROPHILS RELATIVE PERCENT: 69.5 %
RED BLOOD CELL COUNT: 5.52 10*12/L — ABNORMAL HIGH (ref 4.50–5.20)
RED BLOOD CELL COUNT: 5.52 10*12/L — ABNORMAL HIGH (ref 4.50–5.20)
RED CELL DISTRIBUTION WIDTH: 13.9 % (ref 12.0–15.0)
WBC ADJUSTED: 8.1 10*9/L (ref 4.5–11.0)

## 2018-08-06 LAB — URINALYSIS
BILIRUBIN UA: NEGATIVE
GLUCOSE UA: NEGATIVE
KETONES UA: NEGATIVE
NITRITE UA: NEGATIVE
PH UA: 6 (ref 5.0–9.0)
PROTEIN UA: 30 — AB
RBC UA: 13 /HPF — ABNORMAL HIGH (ref <=3)
SPECIFIC GRAVITY UA: 1.026 (ref 1.003–1.030)
SQUAMOUS EPITHELIAL: 2 /HPF (ref 0–5)
UROBILINOGEN UA: 4 — AB
WBC UA: 23 /HPF — ABNORMAL HIGH (ref <=2)

## 2018-08-06 LAB — COMPREHENSIVE METABOLIC PANEL
ALBUMIN: 4.4 g/dL (ref 3.5–5.0)
ALKALINE PHOSPHATASE: 75 U/L (ref 38–126)
ALT (SGPT): 20 U/L (ref <35)
ANION GAP: 12 mmol/L (ref 7–15)
BLOOD UREA NITROGEN: 20 mg/dL (ref 7–21)
BUN / CREAT RATIO: 26
BUN / CREAT RATIO: 26 mL/min/1.73m2 — ABNORMAL HIGH (ref >=60–<35)
CALCIUM: 9.7 mg/dL (ref 8.5–10.2)
CHLORIDE: 104 mmol/L (ref 98–107)
CO2: 25 mmol/L (ref 22.0–30.0)
EGFR CKD-EPI AA FEMALE: >90 mL/min/{1.73_m2} (ref >=60)
EGFR CKD-EPI AA MALE: >90 mL/min/{1.73_m2} (ref >=60)
EGFR CKD-EPI NON-AA FEMALE: >90 mL/min/{1.73_m2} (ref >=60)
EGFR CKD-EPI NON-AA MALE: >90 mL/min/{1.73_m2} (ref >=60)
GLUCOSE RANDOM: 190 mg/dL — ABNORMAL HIGH (ref 70–179)
POTASSIUM: 4.5 mmol/L (ref 3.5–5.0)
PROTEIN TOTAL: 8.1 g/dL (ref 6.5–8.3)
SODIUM: 141 mmol/L (ref 135–145)

## 2018-08-06 LAB — TOXICOLOGY SCREEN, URINE
AMPHETAMINE SCREEN URINE: <500
BARBITURATE SCREEN URINE: <200
BENZODIAZEPINE SCREEN, URINE: <200
CANNABINOID SCREEN URINE: <20

## 2018-08-06 LAB — LIPASE: LIPASE: 63 U/L (ref 44–232)

## 2018-08-06 NOTE — Unmapped (Signed)
Pt was discharged and was waiting for a bus and states one of the officers at the hospital would not let her in to use the bathroom and is starting to feel SI now. Pt denies HI Pt denies a plan, but would like to carry one out to death  Pt states she has been trying to use drugs and drink herself to death.     PT states she lied to this RN in the last triage and is mad we have to reask questions.
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
## 2018-08-06 NOTE — Unmapped (Signed)
Will return for new or worsening s/sx and concerns. Will f/u as with PCP

## 2018-08-06 NOTE — Unmapped (Signed)
Bed: 72-D  Expected date:   Expected time:   Means of arrival:   Comments:

## 2018-08-06 NOTE — Unmapped (Signed)
[facility]  Emergency Department Provider Note      ED Clinical Impression     Final diagnoses:   Cough (Primary)       Initial Impression, ED Course, Assessment and Plan     Impression: Ryan Robinson is a 43 y.o. 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    Patient reported that her symptoms are unchanged since her presentation here to the emergency department 3 days ago.  Chart review showed the patient had negative RPP, clear chest x-ray, negative urinalysis.  Patient did have a low CD4 count and high HIV viral load for which she was referred to infectious disease.  Labs were otherwise unremarkable.  Patient appears nontoxic.  Patient afebrile here, satting well on room air.    Patient PERC negative.    Given patient's CD4 count and high HIV viral load, will obtain blood cultures and order coronavirus testing.  Patient's presentation is most consistent with bronchitis in the context of patient continuing to smoke cigarettes and smoke crack.  Patient was given a dose of Decadron and albuterol MDI while here in emergency department.    Patient refused coronavirus testing.    Patient was given precautions for isolation given the fact that she may have corona virus.  Patient was encouraged to be sure to follow-up with infectious disease.     ED Triage Vitals [08/06/18 0042]   Vital Signs Group      Temp 36.9 ??C (98.4 ??F)      Temp Source Oral      Heart Rate 86      SpO2 Pulse       Heart Rate Source       Resp 20      BP 143/101      MAP (mmHg)       BP Location       BP Method       Patient Position    SpO2 97 %            Additional Medical Decision Making I have reviewed the vital signs and the nursing notes. Labs and radiology results that were available during my care of the patient were independently reviewed by me and considered in my medical decision making.     Case discussed with ED attending Dr. Norlene Campbell who is agreeable with the plan.    ____________________________________________       History     Chief Complaint  Shortness of Breath      HPI   Ryan Robinson is a 43 y.o. 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  Patient explains she was seen here for similar symptoms 3 days ago and was sent home with instructions for following up with infectious disease.  Patient reports she called the elevated hotline and screen positive so she was sent to the emergency department.  Patient denies any history of DVT/PE,  lower extremity swelling, hemoptysis, chest pain, abdominal pain, nausea, vomiting, diarrhea, dysuria.        Review of Systems:  Constitutional: Subjective fever.  Eyes: no visual changes.  ENT: no sore throat.  Cardiovascular: no chest pain.  Respiratory: Cough, shortness of breath.  Gastrointestinal: no abdominal pain, no vomiting, no diarrhea.  Genitourinary: no dysuria.  Musculoskeletal: no back pain.  Skin: no rash.  Neurological: no headaches, no focal weakness or numbness.    Past Medical History:   Diagnosis Date   ??? ADHD (attention deficit hyperactivity disorder)    ??? Alcoholism (CMS-HCC)    ??? Anorexia nervosa    ??? Anxiety    ??? Bipolar disorder (CMS-HCC)    ??? Cocaine use disorder, moderate, dependence (CMS-HCC) 08/08/2017   ??? Depression    ??? Depressive disorder    ??? HIV (human immunodeficiency virus infection) (CMS-HCC)    ??? Migraines    ??? Panic disorder    ??? R/O Borderline personality disorder 12/02/2015   ??? Seizures (CMS-HCC)    ??? Substance abuse (CMS-HCC)    ??? Substance Use Treatment History        Patient Active Problem List   Diagnosis   ??? HIV (human immunodeficiency virus infection) (CMS-HCC)   ??? Major depression, recurrent, severe without psychosis   ??? Substance induced mood disorder (CMS-HCC)   ??? Alcohol use disorder, moderate, dependence (CMS-HCC)   ??? Essential hypertension (RAF-HCC)   ??? R/O Borderline personality disorder   ??? Urethral stricture   ??? 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 Cigarette nicotine dependence without complication   ??? Acute cystitis   ??? 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 Cocaine use disorder, severe    ??? Acute stress disorder       Past Surgical History:   Procedure Laterality Date   ??? HIP ARTHROPLASTY     ??? JOINT REPLACEMENT  2011    right hip replacement 2/2 AVN         Current Facility-Administered Medications:   ???  albuterol (PROVENTIL HFA;VENTOLIN HFA) 90 mcg/actuation inhaler 2 puff, 2 puff, Inhalation, Q6H PRN, Dene Gentry, MD    Current Outpatient Medications:   ???  albuterol (PROVENTIL HFA;VENTOLIN HFA) 90 mcg/actuation inhaler, Inhale 2 puffs every four (4) hours as needed for wheezing., Disp: 1 Inhaler, Rfl: 0  ???  buPROPion (WELLBUTRIN XL) 150 MG 24 hr tablet, Take 1 tablet (150 mg) by mouth daily with 300 mg tablet for a total daily dose of 450 mg, Disp: 30 tablet, Rfl: 0  ???  buPROPion (WELLBUTRIN XL) 300 MG 24 hr tablet, Take 1 tablet (300 mg) by mouth daily with 150 mg tablet for a total daily dose of 450 mg, Disp: 30 tablet, Rfl: 0  ???  cloNIDine HCl (CATAPRES) 0.1 MG tablet, Take 1/2-1 tablet by mouth twice daily as needed for anxiety., Disp: , Rfl:   ???  dolutegravir (TIVICAY) 50 mg Tab TABLET, Take 1 tablet (50 mg total) by mouth daily., Disp: 30 tablet, Rfl: 0  ???  estradiol (ESTRACE) 2 MG tablet, Take 4 mg by mouth Two (2) times a day., Disp: , Rfl:   ???  gabapentin (NEURONTIN) 300 MG capsule, TAKE 4 CAPSULES (1200 MG) BY MOUTH THREE TIMES DAILY FOR PAIN, Disp: 360 capsule, Rfl: 9  ???  hydroCHLOROthiazide (HYDRODIURIL) 25 MG tablet, TAKE 1 TABLET BY MOUTH ONCE DAILY IN THE MORNING ( USED FOR HIGH BLOOD PRESSURE ), Disp: 30 tablet,  Rfl: 9  ???  prazosin (MINIPRESS) 1 MG capsule, Take 3 capsules (3 mg total) by mouth nightly., Disp: 90 capsule, Rfl: 0  ???  spironolactone (ALDACTONE) 100 MG tablet, Take 1 tablet (100 mg total) by mouth daily., Disp: 30 tablet, Rfl: 0  ???  tamsulosin (FLOMAX) 0.4 mg capsule, Take 1 capsule (0.4 mg total) by mouth nightly., Disp: 90 capsule, Rfl: 3    Allergies  Abilify [aripiprazole]; Amoxicillin; Penicillins; Toradol [ketorolac]; and Ultram [tramadol]    Family History   Problem Relation Age of Onset   ??? Bipolar disorder Mother    ??? ADD / ADHD Mother    ??? Anxiety disorder Mother    ??? Depression Mother    ??? Drug abuse Mother    ??? Anxiety disorder Father    ??? Anxiety disorder Sister    ??? Bipolar disorder Sister    ??? Depression Sister    ??? Paranoid behavior Sister    ??? Seizures Sister    ??? Drug abuse Brother    ??? Bipolar disorder Maternal Aunt    ??? Depression Maternal Aunt    ??? Bipolar disorder Cousin    ??? Depression Cousin    ??? Drug abuse Cousin        Social History  Social History     Tobacco Use   ??? Smoking status: Current Every Day Smoker     Packs/day: 1.00     Years: 20.00     Pack years: 20.00     Types: Cigarettes   ??? Smokeless tobacco: Never Used   Substance Use Topics   ??? Alcohol use: Yes     Comment: 3-4 drinks of 24oz daily   ??? Drug use: Yes     Types: Crack cocaine, Marijuana     Comment: clean almost one year         Physical Exam     ED Triage Vitals [08/06/18 0042]   Enc Vitals Group      BP 143/101      Heart Rate 86      SpO2 Pulse       Resp 20      Temp 36.9 ??C (98.4 ??F)      Temp Source Oral      SpO2 97 %      Weight       Height       Head Circumference       Peak Flow       Pain Score Pain Loc       Pain Edu?       Excl. in GC?        Constitutional: Alert and oriented. Well appearing. In no distress.  Eyes: Conjunctivae are normal.  ENT       Head: Normocephalic and atraumatic.       Nose: No congestion.       Mouth/Throat: Mucous membranes are moist.       Neck: No stridor.  Hematological/Lymphatic/Immunilogical: No cervical lymphadenopathy.  Cardiovascular: rate as vital signs, regular rhythm. Normal and symmetric distal pulses are present in all extremities.  Respiratory: Normal respiratory effort, symmetrical expansion of chest. No wheezes, no rales.  Gastrointestinal: Soft and nontender. No rigid abdomen present.  Genitourinary: Deferred  Musculoskeletal: No tenderness or edema of bilateral LEs.  Neurologic: Normal speech and language. No gross focal neurologic deficits are appreciated.  Skin: Skin is warm, dry and intact. No rash noted.  Psychiatric: Mood and affect are normal. Speech  and behavior are normal.    Radiology     No orders to display       Pertinent labs & imaging results that were available during my care of the patient were reviewed by me and considered in my medical decision making (see chart for details).     Please note- This chart has been created using [employer]. Chart creation errors have been sought, but may not always be located and such creation errors, especially pronoun confusion, do NOT reflect on the standard of medical care.        Dene Gentry, MD  Resident  08/06/18 [phone]

## 2018-08-06 NOTE — Unmapped (Signed)
Pt states SOB, fever and chills with exhaustion for the last month, but that they have been getting worse. Pt states she has not been taking her high blood pressure meds or her HIV meds.  Pt states last used cocaine and alcohol yesterday

## 2018-08-06 NOTE — Unmapped (Signed)
[facility]  Emergency Department Provider Note     ED Clinical Impression      Final diagnoses:   Suicidal ideation (Primary)       Impression, ED Course, Assessment and Plan      Impression:  43 y.o. adult male to male transgender (not currently on hormonal medication), HIV not currently on antiretroviral therapy with last known CD4 count 142, hypertension, migraines, polysubstance use including alcohol and crack cocaine who presents for evaluation of suicidal ideation.  Patient was just evaluated in this ED yesterday due to complaint of cough, intermittent subjective fever for approximately 1 month.  Patient was seen on 3/17 for similar, was discharged with instruction to follow-up with ID.  Respiratory pathogen panel was negative on 3/17.  Work-up also included normal chest x-ray, mild leukopenia, low CD4 count, high HIV viral load.  Again evaluated on 3/20, refused COVID???19 swab.  When she was discharged, she reportedly became frustrated with a member of hospital police who would not let her back into the facility to use the bathroom.  She then expressed suicidal ideation with a plan to not take her medications, overuse alcohol and drugs.  She reports she has been feeling suicidal for approximately 1 month.  She denies homicidal ideation.  Last alcohol and crack cocaine use were yesterday afternoon.  She continues to report fever, chills, dyspnea, shortness of breath, cough.  Denies chest pain, abdominal pain, vomiting.  Reports intermittent diarrhea and constipation.  Has lesions on her upper extremities that she reports are from picking her skin.     Vital signs this morning are unremarkable.  Patient looks very well, nontoxic, in no acute distress.  On exam, lungs are clear bilaterally, heart sounds are normal.  Abdomen is soft, nontender.  Small scattered erythematous papules about the upper extremities bilaterally, which the patient reports are from picking her skin.  Clear oropharynx without erythema or exudate.  Anicteric sclera.  We will plan to have the patient evaluated by PES for SI.  Plan for psych screening labs, urinalysis, U tox, ECG.  We will also plan to add on COVID???19 to previous RPP obtained on 3/17, as patient continues to refuse nasopharyngeal swab.  However, I feel COVID???19 is less likely given the chronicity of patient's symptoms.  Patient's respiratory symptoms and fever could certainly be due to HIV with high viral load, not currently on antiretroviral therapy.  PJP pneumonia considered, but thought to be less likely given recent normal chest x-ray. Plan to consult PES in the morning.     5:19 AM  Patient reports she will give urine sample, but refusing blood draw at this time.  Asking for food.  Discussed adding on COVID???19 to patient's previous RPP swab with microbiology.  However, the staff who manage this add-on are not currently in the lab.  Advised to call back in the morning.     7:00 AM  Patient signed out to Dr. Gala Murdoch, the oncoming ED resident.  Please see his documentation for final disposition of the patient.     Additional Medical Decision Making     I have reviewed the vital signs and the nursing notes. Labs and radiology results that were available during my care of the patient were independently reviewed by me and considered in my medical decision making.     I reviewed the patient's prior medical records.     I discussed the case with Dr. Norlene Campbell, the ED attending physician.  Portions of this record have been created using [employer], clinical (histocompatibility and immunogenetics). Dictation errors have been sought, but may not have been identified and corrected.  ____________________________________________         History        Chief Complaint  Suicide Attempt      HPI   Ryan Robinson is a 43 y.o. adult male to male transgender (not currently on hormonal medication), HIV not currently on antiretroviral therapy with last known CD4 count 142, hypertension, migraines, polysubstance use including alcohol and crack cocaine who presents for evaluation of suicidal ideation.  Patient was just evaluated in this ED yesterday due to complaint of cough, intermittent subjective fever for approximately 1 month.  Patient was seen on 3/17 for similar, was discharged with instruction to follow-up with ID.  Respiratory pathogen panel was negative on 3/17.  Work-up also included normal chest x-ray, mild leukopenia, low CD4 count, high HIV viral load.  Again evaluated on 3/20, refused COVID???19 swab.  When she was discharged, she reportedly became frustrated with a member of hospital police who would not let her back into the facility to use the bathroom.  She then expressed suicidal ideation with a plan to not take her medications, overuse alcohol and drugs.  She reports she has been feeling suicidal for approximately 1 month.  She denies homicidal ideation.  Last alcohol and crack cocaine use were yesterday afternoon.  She continues to report fever, chills,  dyspnea, shortness of breath, cough.  Denies chest pain, abdominal pain, vomiting.  Reports intermittent diarrhea and constipation.  Has lesions on her upper extremities that she reports are from picking her skin.      Past Medical History:   Diagnosis Date   ??? ADHD (attention deficit hyperactivity disorder)    ??? Alcoholism (CMS-HCC)    ??? Anorexia nervosa    ??? Anxiety    ??? Bipolar disorder (CMS-HCC)    ??? Cocaine use disorder, moderate, dependence (CMS-HCC) 08/08/2017   ??? Depression    ??? Depressive disorder    ??? HIV (human immunodeficiency virus infection) (CMS-HCC)    ??? Migraines    ??? Panic disorder    ??? R/O Borderline personality disorder 12/02/2015   ??? Seizures (CMS-HCC)    ??? Substance abuse (CMS-HCC)    ??? Substance Use Treatment History        Patient Active Problem List   Diagnosis   ??? HIV (human immunodeficiency virus infection) (CMS-HCC)   ??? Major depression, recurrent, severe without psychosis   ??? Substance induced mood disorder (CMS-HCC)   ??? Alcohol use disorder, moderate, dependence (CMS-HCC)   ??? Essential hypertension (RAF-HCC)   ??? R/O Borderline personality disorder   ??? Urethral stricture   ??? 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 Cigarette nicotine dependence without complication   ??? Acute cystitis   ??? 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 Cocaine use disorder, severe    ??? Acute stress disorder       Past Surgical History:   Procedure Laterality Date   ??? HIP ARTHROPLASTY     ??? JOINT REPLACEMENT  2011    right hip replacement 2/2 AVN       No current facility-administered medications for this encounter.     Current Outpatient Medications:   ???  albuterol (PROVENTIL HFA;VENTOLIN HFA) 90 mcg/actuation inhaler, Inhale 2 puffs every four (4) hours as needed for wheezing., Disp: 1 Inhaler, Rfl: 0  ???  buPROPion (WELLBUTRIN XL)  150 MG 24 hr tablet, Take 1 tablet (150 mg) by mouth daily with 300 mg tablet for a total daily dose of 450 mg, Disp: 30 tablet, Rfl: 0  ???  buPROPion (WELLBUTRIN XL) 300 MG 24 hr tablet, Take 1 tablet (300 mg) by mouth daily with 150 mg tablet for a total daily dose of 450 mg, Disp: 30 tablet, Rfl: 0  ???  cloNIDine HCl (CATAPRES) 0.1 MG tablet, Take 1/2-1 tablet by mouth twice daily as needed for anxiety., Disp: , Rfl:   ???  dolutegravir (TIVICAY) 50 mg Tab TABLET, Take 1 tablet (50 mg total) by mouth daily., Disp: 30 tablet, Rfl: 0  ???  estradiol (ESTRACE) 2 MG tablet, Take 4 mg by mouth Two (2) times a day., Disp: , Rfl:   ???  gabapentin (NEURONTIN) 300 MG capsule, TAKE 4 CAPSULES (1200 MG) BY MOUTH THREE TIMES DAILY FOR PAIN, Disp: 360 capsule, Rfl: 9  ???  hydroCHLOROthiazide (HYDRODIURIL) 25 MG tablet, TAKE 1 TABLET BY MOUTH ONCE DAILY IN THE MORNING ( USED FOR HIGH BLOOD PRESSURE ), Disp: 30 tablet, Rfl: 9  ???  prazosin (MINIPRESS) 1 MG capsule, Take 3 capsules (3 mg total) by mouth nightly., Disp: 90 capsule, Rfl: 0  ???  spironolactone (ALDACTONE) 100 MG tablet, Take 1 tablet (100 mg total) by mouth daily., Disp: 30 tablet, Rfl: 0  ???  tamsulosin (FLOMAX) 0.4 mg capsule, Take 1 capsule (0.4 mg total) by mouth nightly., Disp: 90 capsule, Rfl: 3    Allergies  Abilify [aripiprazole]; Amoxicillin; Penicillins; Toradol [ketorolac]; and Ultram [tramadol]    Family History   Problem Relation Age of Onset   ??? Bipolar disorder Mother    ??? ADD / ADHD Mother    ??? Anxiety disorder Mother    ??? Depression Mother    ??? Drug abuse Mother    ??? Anxiety disorder Father    ??? Anxiety disorder Sister    ??? Bipolar disorder Sister    ??? Depression Sister    ??? Paranoid behavior Sister    ??? Seizures Sister    ??? Drug abuse Brother    ??? Bipolar disorder Maternal Aunt    ??? Depression Maternal Aunt    ??? Bipolar disorder Cousin    ??? Depression Cousin    ??? Drug abuse Cousin        Social History  Social History     Tobacco Use   ??? Smoking status: Current Every Day Smoker     Packs/day: 1.00     Years: 20.00     Pack years: 20.00     Types: Cigarettes   ??? Smokeless tobacco: Never Used   Substance Use Topics   ??? Alcohol use: Yes     Comment: 3-4 drinks of 24oz daily   ??? Drug use: Yes     Types: Crack cocaine, Marijuana     Comment: clean almost one year       Review of Systems    Constitutional: Positive for fever.  Cardiovascular: Negative for chest pain.  Respiratory: Positive for cough, shortness of breath.  Gastrointestinal: Negative for abdominal pain, vomiting.  Positive for intermittent diarrhea, constipation    All other systems reviewed and are negative except as noted in HPI.      Physical Exam     ED Triage Vitals [08/06/18 0421]   Enc Vitals Group      BP 142/93      Heart Rate 64  SpO2 Pulse       Resp 18      Temp 36.7 ??C (98.1 ??F)      Temp Source Oral      SpO2 95 %      Weight       Height       Head Circumference       Peak Flow       Pain Score       Pain Loc       Pain Edu?       Excl. in GC?        Constitutional: Alert and oriented. Well appearing and in no acute distress.  Eyes: Conjunctivae are normal.   ENT       Head: Normocephalic and atraumatic.       Nose: No epistaxis.       Mouth/Throat: Mucous membranes are moist, oropharynx without visible erythema or exudate.        Neck: No stridor.  Cardiovascular: Normal rate, regular rhythm. Normal and symmetric distal pulses are present in the upper extremities.  Respiratory: Normal respiratory effort. Breath sounds clear bilaterally.  Gastrointestinal: Soft and nontender.   Genitourinary: Deferred   Musculoskeletal: Moves all extremities spontaneously.        Right lower leg: No tenderness or edema.       Left lower leg: No tenderness or edema.  Neurologic: Normal speech and language. No gross focal neurologic deficits are appreciated.  Skin: Skin is warm, dry and intact.  Scattered erythematous papules noted to the bilateral upper extremities.  Psychiatric: Mood and affect are normal. Speech and behavior are normal.     EKG          Radiology          Procedures            Lisa Roca, MD  Resident  08/06/18 [phone]

## 2018-08-06 NOTE — Unmapped (Signed)
Bed: 68-D  Expected date:   Expected time:   Means of arrival:   Comments:

## 2018-08-06 NOTE — Unmapped (Signed)
Pt rounding completed. Patient is in room resting. Reviewed with patient plan of care. Patients bed in low position and locked. Will continue to monitor patient and reassess.     Pt is refusing all blood work at this time. Pt educated that by not doing blood work it is delaying care and that she will be stuck in this room for a delayed time. Pt states she does not want blood work till later. MD notified.    Pt also started to yell at this RN, when this RN asked for clarification on what she would like to be called. Pt is in room laying. NA outside room monitoring pt for q15 as ordered.

## 2018-08-07 NOTE — Unmapped (Signed)
-----------------------------------------    6:51 PM (August 06, 2018)  -----------------------------------------  Assumed pt care from previous resident. Briefly, this is a 43 y.o. adult with a pmhx of poorly controlled HIV and multiple psychiatric presentations presenting with SI after police told her she had to leave hospital grounds.      BP 146/98  - Pulse 78  - Temp 36.6 ??C (97.8 ??F) (Oral)  - Resp 18  - SpO2 98%     Workup:  --PT initially refusing workup, significantly delaying care.     PLAN:  -- Psych has cleared. If CXR is unremarkable, pt can likely follow up with ID clinic next week to re-establish care. She has no indication for psychiatric or medical admission at this time. COVID pending, will place on F/U list. Extremely low suspicion for this at this time given how chronic her symptoms of cough have been.       ED Course as of Aug 05 1849   Sat Aug 06, 2018   1850 Sodium: 141   1850 Potassium: 4.5   1850 Creatinine: 0.78   1850 Glucose(!): 190   1850 Toxicology Screen, Urine(!):    Amphetamine Screen, Ur <500 ng/mL   Barbiturate Screen, Ur <200 ng/mL   Benzodiazepine Screen, Urine <200 ng/mL   Cannabinoid Scrn, Ur <20 ng/mL   Methadone Screen, Urine <300 ng/mL   Cocaine(Metab.)Screen, Urine =/>150 ng/mL(!)   Opiate Scrn, Ur <300 ng/mL   1850 Lipase: 63   1850 Alcohol, Ethyl: <10.0   1850 Acetaminophen Level: <10.0   1850 Salicylate Lvl: 10.0   1850 Urinalysis(!):    Color, UA Amber   Clarity, UA Cloudy   Spec Grav, UA 1.026   pH, UA 6.0   Leukocyte Esterase, UA Large(!)   Nitrite, UA Negative   Protein, UA 30 mg/dL(!)   Glucose, UA Negative   Ketones, UA Negative   Urobilinogen, UA 4.0 mg/dL(!)   Bilirubin, UA Negative   Blood, UA Negative   RBC, UA 13(!)   WBC, UA 23(!)   Squam Epithel, UA 2   Bacteria, UA Moderate(!)   Ca Oxalate Crystal, UA Few   Mucus, UA Rare(!)   1850 Rapid Influenza/RSV PCR w/ reflex to COVID-19 PCR:    Influenza A Negative   Influenza B Negative   RSV Negative   1850 WBC: 8.1 1850 HGB(!): 17.1   1850 Platelet: 223

## 2018-08-07 NOTE — Unmapped (Signed)
Received sign out from Dr. Gala Murdoch.     ED I-PASS Handoff  ?? Illness Severit: stable  ?? Patient Summary: Ryan Robinson is a 43 y.o. adult MTF transgender presenting with SI after police told her she had to leave hospital grounds. She has a past history of poorly controlled HIV with medication noncompliance and a h/o frequent psych presentations. She initially refused workup. Psych as cleared.  ?? Action List: Chest x-ray and discuss with ID    8:32 PM  Spoke with Dr. Smith Mince, he wrote down patient's MRN and will facilitate follow up with ID. Patient was the updated with findings of negative chest x-ray and plan for discharged with ID follow up. She immediately became significantly agitated and physical aggressive, I immediately left the room for safety and campus police called.

## 2018-08-07 NOTE — Unmapped (Signed)
MD informed pt that She will be discharged at this time and encourages her to follow up with ID in regards to her COVID test results. Pt gets very angry and throws tray at MD. Pt screams at MD and throws the medicine tray at the MD. Pt is very aggressive and yelling at staff. Security called at this time.

## 2018-08-07 NOTE — Unmapped (Signed)
Please call the following shelters to inquire if they are accepting people at this time:    [facility]:  [phone]     [facility]: Must report to [facility] for  A Coordinated Entry and Diversion Intake.   Writer was unable to reach anyone at the listed phone number and left a VM at [phone] as directed by the weekend numbers'  [phone] voice message.    Intake Hours for Assessment and Referrals  Day Hours 8:30 AM to 3:00 PM   [employer] (D[facility])  L[facility] (Aging and Adult Services)  [address], [place] [address]    Evening Hours 4:30 PM to 8:00 PM  Weekend Hours 4:30 PM to 8:00 PM  Phone Number: [phone]    [facility]  [address] [place] [address]    ([address]???between [address] and above [address] close to [employer] and [employer])  Ring the [employer] on the A Frame sign that says V.O.A (doorbell is not on the building).

## 2018-08-07 NOTE — Unmapped (Signed)
Blood Culture    Order: [id number] and  Order: [id number]  Status:  Preliminary result ????    No Growth at 24 hours

## 2018-08-08 NOTE — Unmapped (Signed)
Notes recorded by Oralia Manis, RN on 08/07/2018 at 6:59 AM EDT  Blood Culture ??  Order: [id number] and ??Order: [id number]  Status: ??Preliminary result ????    No Growth at 48 hours

## 2018-08-08 NOTE — Unmapped (Addendum)
Attempted to call pt with negative results 08/07/2018 17:58 at this time, the number provided state this is not pt number. There are no other means of contact for this pt at this time.

## 2018-08-09 NOTE — Unmapped (Signed)
Notes recorded by Oralia Manis, RN on 08/08/2018 at 7:26 AM EDT  Notes recorded by Oralia Manis, RN on 08/07/2018 at 6:59 AM EDT  Blood Culture ??  Order: [id number] and ??Order: [id number]  Status: ??Preliminary result ????    No Growth at 72 hours

## 2018-08-10 ENCOUNTER — Ambulatory Visit: Admit: 2018-08-10 | Discharge: 2018-08-12 | Disposition: A | Discharge status: Home / Self Care | Payer: MEDICARE

## 2018-08-10 DIAGNOSIS — R569 Unspecified convulsions: Principal | ICD-10-CM

## 2018-08-10 DIAGNOSIS — Z21 Asymptomatic human immunodeficiency virus [HIV] infection status: Principal | ICD-10-CM

## 2018-08-10 DIAGNOSIS — I10 Essential (primary) hypertension: Principal | ICD-10-CM

## 2018-08-10 DIAGNOSIS — R1110 Vomiting, unspecified: Principal | ICD-10-CM

## 2018-08-10 DIAGNOSIS — N400 Enlarged prostate without lower urinary tract symptoms: Principal | ICD-10-CM

## 2018-08-10 DIAGNOSIS — R05 Cough: Principal | ICD-10-CM

## 2018-08-10 DIAGNOSIS — R45850 Homicidal ideations: Principal | ICD-10-CM

## 2018-08-10 DIAGNOSIS — R45851 Suicidal ideations: Principal | ICD-10-CM

## 2018-08-10 DIAGNOSIS — Z9119 Patient's noncompliance with other medical treatment and regimen: Principal | ICD-10-CM

## 2018-08-10 DIAGNOSIS — F10129 Alcohol abuse with intoxication, unspecified: Principal | ICD-10-CM

## 2018-08-10 DIAGNOSIS — F10929 Alcohol use, unspecified with intoxication, unspecified: Principal | ICD-10-CM

## 2018-08-10 LAB — CBC W/ AUTO DIFF
BASOPHILS ABSOLUTE COUNT: 0.1 10*9/L (ref 0.0–0.1)
EOSINOPHILS ABSOLUTE COUNT: 0.1 10*9/L (ref 0.0–0.4)
HEMATOCRIT: 52.8 % — ABNORMAL HIGH (ref 41.0–46.0)
HEMOGLOBIN: 17 g/dL — ABNORMAL HIGH (ref 13.5–16.0)
LARGE UNSTAINED CELLS: 3 % (ref 0–4)
LYMPHOCYTES ABSOLUTE COUNT: 4 10*9/L (ref 1.5–5.0)
LYMPHOCYTES RELATIVE PERCENT: 48.6 %
MEAN CORPUSCULAR HEMOGLOBIN CONC: 32.3 g/dL (ref 31.0–37.0)
MEAN CORPUSCULAR HEMOGLOBIN: 30.5 pg (ref 26.0–34.0)
MEAN CORPUSCULAR VOLUME: 94.5 fL (ref 80.0–100.0)
MEAN PLATELET VOLUME: 7.9 fL (ref 7.0–10.0)
MONOCYTES ABSOLUTE COUNT: 0.5 10*9/L (ref 0.2–0.8)
MONOCYTES RELATIVE PERCENT: 6.5 %
NEUTROPHILS RELATIVE PERCENT: 39.5 %
PLATELET COUNT: 264 10*9/L (ref 150–440)
RED BLOOD CELL COUNT: 5.58 10*12/L — ABNORMAL HIGH (ref 4.50–5.20)
RED CELL DISTRIBUTION WIDTH: 14 % (ref 12.0–15.0)
WBC ADJUSTED: 8.1 10*9/L (ref 4.5–11.0)

## 2018-08-10 LAB — COMPREHENSIVE METABOLIC PANEL
ALBUMIN: 4.7 g/dL (ref 3.5–5.0)
ALKALINE PHOSPHATASE: 60 U/L (ref 38–126)
ALT (SGPT): 28 U/L (ref <35)
ANION GAP: 13 mmol/L (ref 7–15)
AST (SGOT): 45 U/L (ref 17–47)
BILIRUBIN TOTAL: 0.4 mg/dL (ref 0.0–1.2)
BLOOD UREA NITROGEN: 14 mg/dL (ref 7–21)
BUN / CREAT RATIO: 18
CALCIUM: 9 mg/dL (ref 8.5–10.2)
CHLORIDE: 108 mmol/L — ABNORMAL HIGH (ref 98–107)
CO2: 26 mmol/L (ref 22.0–30.0)
CREATININE: 0.79 mg/dL (ref 0.65–1.15)
EGFR CKD-EPI AA FEMALE: >90 mL/min/{1.73_m2} (ref >=60)
EGFR CKD-EPI AA MALE: >90 mL/min/{1.73_m2} (ref >=60)
EGFR CKD-EPI NON-AA FEMALE: >90 mL/min/{1.73_m2} (ref >=60)
EGFR CKD-EPI NON-AA MALE: >90 mL/min/{1.73_m2} (ref >=60)
POTASSIUM: 4.4 mmol/L (ref 3.5–5.0)
PROTEIN TOTAL: 8.4 g/dL — ABNORMAL HIGH (ref 6.5–8.3)
SODIUM: 147 mmol/L — ABNORMAL HIGH (ref 135–145)

## 2018-08-11 LAB — URINALYSIS WITH CULTURE REFLEX
BILIRUBIN UA: NEGATIVE
BLOOD UA: NEGATIVE
GLUCOSE UA: NEGATIVE
KETONES UA: NEGATIVE
PH UA: 7 (ref 5.0–9.0)
PROTEIN UA: NEGATIVE
SPECIFIC GRAVITY UA: 1.017 (ref 1.003–1.030)
SQUAMOUS EPITHELIAL: <1 /HPF (ref 0–5)
UROBILINOGEN UA: 2 — AB

## 2018-08-11 LAB — TOXICOLOGY SCREEN, URINE
AMPHETAMINE SCREEN URINE: <500
BARBITURATE SCREEN URINE: <200
BENZODIAZEPINE SCREEN, URINE: <200
CANNABINOID SCREEN URINE: <20
METHADONE SCREEN, URINE: <300

## 2018-08-11 LAB — CBC W/ AUTO DIFF: LARGE UNSTAINED CELLS: 3 % — ABNORMAL HIGH (ref 0–4)

## 2018-08-11 LAB — COMPREHENSIVE METABOLIC PANEL: EGFR CKD-EPI NON-AA MALE: >90 mL/min/1.73m2 — ABNORMAL HIGH (ref >=60–107)

## 2018-08-11 NOTE — Unmapped (Signed)
Blood Culture ??  Order: [id number] and ??Order: [id number]  Status: ??Preliminary result ??       No Growth at 4 days

## 2018-08-11 NOTE — Unmapped (Signed)
PT received lunch tray.

## 2018-08-11 NOTE — Unmapped (Signed)
Notes recorded by Alfredia Client, RN on 08/10/2018 at 7:18 PM EDT  Blood Culture ??  Order: [id number] and ??Order: [id number]  Status: ??final result ??      No Growth at 5 days

## 2018-08-11 NOTE — Unmapped (Signed)
Pt here for ETOH, pt also with a cough, unknown amount of time. Pt tearful on exam, dry heaving and cough. Pt also endorses SI x1 month w/ plan to cut her throat. Pt also endorses HI w/ plan to kill her ex boy friend with no specific plan. Pt not giving details on which substances she took today. Denies AH/VH.

## 2018-08-11 NOTE — Unmapped (Signed)
[facility] Emergency Department Provider Note      ED CLINICAL IMPRESSION:     Final diagnoses:   Suicidal ideation (Primary)   Homicidal ideation   Alcoholic intoxication with complication (CMS-HCC)       ASSESSMENT:     IMPRESSION & PLAN:   This is a 43 y.o. male to male transgender (not currently on hormonal medication), HIV not currently on antiretroviral therapy with last known CD4 count 142, hypertension, migraines, polysubstance use including alcohol and crack cocaine who presents for evaluation of suicidal ideation, homicidal ideation, and alcohol intoxication. Denies CAH or intentional ingestion other than alcohol. Chart checked on alternate chart/MRN w/ full medical history reviewed. Long psychiatric hx and substance use hx requiring admission.    Pt initially sent to [facility] because of an episode of retching/coughing in triage but observed without further cough/respiratory symptoms, afebrile, normal work of breathing and no hypoxia. Physical exam shows intermittently agitated, transgender male to male, blanket pulled over head yelling she will kill someone. Recent COVID-19 testing negative on 3/21.    Based on my evaluation at this point, there are no signs, symptoms or complaints of an acute medical problem other than alcohol intoxication. The patient was placed on a precautionary hold given the concerning nature of the presentation. Will obtain psychiatric screening workup, though pt currently refusing blood draw, CXR, EKG, and urine. I will discuss the case with the on-call psychiatric provider as I feel that the patient should be seen and evaluated by psychiatry before leaving the emergency department.      3:43 AM  Pt allowed for blood draw but still refusing urine/imaging/EKG. EtOH 221. Hemoconcentrated, recommend PO hydration. Pt to be evaluated by psychiatry.       ____________________________________________    I have reviewed the triage vital signs and the nursing notes.  I have discussed the case with the ED Attending, Dr. No att. providers found.     MEDICAL HISTORY:      TIME SEEN:  August 10, 2018 10:17 PM    CHIEF COMPLAINT: Alcohol Intoxication; Suicidal; and Homicidal      HISTORY OF PRESENT ILLNESS:   Ryan Robinson is a 43 y.o.male to male transgender (not currently on hormonal medication), HIV not currently on antiretroviral therapy with last known CD4 count 142, hypertension, migraines, polysubstance use including alcohol and crack cocaine who presents for evaluation of suicidal ideation, homicidal ideation, and alcohol intoxication. Denies CAH or intentional ingestion other than alcohol. Will not disclose how much she drank or when her last drug use was. Pt initially sent [facility] because of an episode of retching/coughing in triage but observed without further cough/respiratory symptoms, afebrile, normal work of breathing and no hypoxia. Pt was here w/ similar symptoms including cough on 3/21 w/ negative COVID-19 testing. Denies hx ICU stays/seizure related to withdrawal.     Additional History: I have briefly reviewed available oustide records available from [facility] for purposes of continued care     History and Review of Systems Limited By: Psychiatric illness    REVIEW OF SYSTEMS:   General/Constitutional: Negative for fever. Negative for weakness.  HEENT:  Negative for sore throat. Negative for changes in vision.  Cardiovascular: Negative for chest pain. Negative for palpitations.  Respiratory: Negative for SOB.  Negative for hemoptysis.  Gastrointestinal: Negative for nausea. Negative for vomiting. Negative for diarrhea. Negative for constipation. Negative for abdominal pain.  Genitourinary: Negative for dysuria.  Musculoskeletal: Negative for muscle aches. Negative  for joint pains.  Integumentary: Negative for skin rashs.  Neurologic: Negative for headache. Negative for dizziness. Negative for syncope.  Psychiatric: +depression/anxiety.  Hematologic: Negative for any recent prolonged bleeding. Negative for easy bruising.    PAST MEDICAL HISTORY:  No past medical history on file.    SURGICAL HISTORY:  has no past surgical history on file.    OUTPATIENT MEDICATIONS:  Prior to Admission medications    Not on File       ALLERGIES: has No Known Allergies.    FAMILY HISTORY: family history is not on file.    SOCIAL HISTORY:  Tobacco use:  has no history on file for tobacco.   Alcohol use:  has no history on file for alcohol.  Drug use:  has no history on file for drug.    PHYSICAL EXAM:     ED Vital Signs:  Vitals:    08/10/18 1953 08/10/18 2000   BP: 124/114 124/111   Pulse:  80   Resp: 18    Temp: 36.5 ??C (97.7 ??F)    TempSrc: Oral    SpO2: 100%        Constitutional: Alert and oriented. Well appearing and in no distress. Intermittently agitated, transgender male to male, blanket pulled over head yelling she will kill someone.   Eyes: Conjunctivae are normal.  ENT       Head: Normocephalic and atraumatic.       Nose: No epistaxis.       Mouth/Throat: Mucous membranes are moist.       Neck: No stridor. No nuchal rigidity.  Hematological/Lymphatic/Immunilogical: No cervical lymphadenopathy.  Cardiovascular: Normal rate, regular rhythm. Normal and symmetric distal pulses are present in bilateral upper extremities.  Respiratory: Normal respiratory effort. Breath sounds are clear to auscultation and equal bilaterally.  Gastrointestinal: Soft and nontender. There is no rebound tenderness, guarding or rigidity.  Genitourinary: There is no CVA tenderness.  Musculoskeletal: Nontender with normal range of motion in all extremities.  Right lower leg: No tenderness or edema.  Left lower leg: No tenderness or edema.  Neurologic: Normal speech and language. No gross focal neurologic deficits are appreciated.  Skin: Skin is warm, dry and intact. No rash noted.  Psychiatric: SI+, HI+, CAH-, appears intoxicated, agitated    LABORATORY DATA:     Results for orders placed or performed during the hospital encounter of 08/10/18   Comprehensive metabolic panel   Result Value Ref Range    Sodium 147 (H) 135 - 145 mmol/L    Potassium 4.4 3.5 - 5.0 mmol/L    Chloride 108 (H) 98 - 107 mmol/L    Anion Gap 13 7 - 15 mmol/L    CO2 26.0 22.0 - 30.0 mmol/L    BUN 14 7 - 21 mg/dL    Creatinine 1.61 0.96 - 1.15 mg/dL    BUN/Creatinine Ratio 18     EGFR CKD-EPI Non-African American, Male >90 >=60 mL/min/1.85m2    EGFR CKD-EPI Non-African American, Male >90 >=60 mL/min/1.41m2    EGFR CKD-EPI African American, Male >90 >=60 mL/min/1.25m2    EGFR CKD-EPI African American, Male >90 >=60 mL/min/1.95m2    Glucose 92 70 - 179 mg/dL    Calcium 9.0 8.5 - 04.5 mg/dL    Albumin 4.7 3.5 - 5.0 g/dL    Total Protein 8.4 (H) 6.5 - 8.3 g/dL    Total Bilirubin 0.4 0.0 - 1.2 mg/dL    AST 45 17 - 47 U/L    ALT 28 <35  U/L    Alkaline Phosphatase 60 38 - 126 U/L   TSH   Result Value Ref Range    TSH 0.524 (L) 0.600 - 3.300 uIU/mL   TSH   Result Value Ref Range    TSH 0.562 (L) 0.600 - 3.300 uIU/mL   Salicylate level   Result Value Ref Range    Salicylate Lvl <10.0 <300.0 ug/mL   Acetaminophen level   Result Value Ref Range    Acetaminophen Level <10.0 <=30.0 ug/ml   Ethanol,Blood   Result Value Ref Range    Alcohol, Ethyl 221.0 Undefined mg/dL   CBC w/ Differential   Result Value Ref Range    WBC 8.1 4.5 - 11.0 10*9/L    RBC 5.58 (H) 4.50 - 5.20 10*12/L    HGB 17.0 (H) 13.5 - 16.0 g/dL    HCT 16.1 (H) 09.6 - 46.0 %    MCV 94.5 80.0 - 100.0 fL    MCH 30.5 26.0 - 34.0 pg    MCHC 32.3 31.0 - 37.0 g/dL    RDW 04.5 40.9 - 81.1 %    MPV 7.9 7.0 - 10.0 fL    Platelet 264 150 - 440 10*9/L    Neutrophils % 39.5 %    Lymphocytes % 48.6 %    Monocytes % 6.5 %    Eosinophils % 1.5 %    Basophils % 1.0 %    Absolute Neutrophils 3.2 2.0 - 7.5 10*9/L    Absolute Lymphocytes 4.0 1.5 - 5.0 10*9/L    Absolute Monocytes 0.5 0.2 - 0.8 10*9/L    Absolute Eosinophils 0.1 0.0 - 0.4 10*9/L    Absolute Basophils 0.1 0.0 - 0.1 10*9/L    Large Unstained Cells 3 0 - 4 %       PROCEDURES:     I performed no procedures on this patient during this encounter.      Pertinent labs & imaging results that were available during my care of the patient were reviewed by me and considered in my medical decision making. Labs and radiology studies included in my note may not constitute all ordered/reviewied labs during this encounter (see chart for details).         Karilyn Cota, MD  Resident  08/11/18 [phone]

## 2018-08-11 NOTE — Unmapped (Signed)
PT received dinner tray.

## 2018-08-11 NOTE — Unmapped (Signed)
[facility]    Psychiatry Emergency Service     Initial Consult      Service Date:  August 11, 2018  Admit Date/Time: 08/10/2018  7:44 PM  LOS:    LOS: 0 days   Service requesting consult:  Emergency Medicine   Requesting Attending Physician:  Santa Genera, NP  Location of patient: [facility] ED  Consulting Attending: Lynnea Maizes, MD  Consulting Resident/Provider: Sondra Barges LCSW-A    Assessment   Ryan Robinson is a 43 y.o., Other Race race, Not Hispanic or Latino ethnicity,  ENGLISH speaking adult well known to PES with a history of substance induced mood disorder, MDD rule out, borderline personality disorder, and polysubstance abuse (cocaine and alcohol), who presents to the ED for evaluation of safety secondary to SI in context of alcohol intoxication. On exam while sober, while the patient endorses chronic complaints of SI she is capable of future-focused thinking demonstrated via concerns for contracting COVID-19, intentions of restarting medications, and expressed interest in pursuing substance use treatment. Patient is capable of recognizing past successes while in recovery and complaint with medications. She appears to be in the Preparation stage of change presently as she independently contacted [facility] while in the ED and was accepted for treatment there. As the patient lacks acute psychiatric symptomology at this time, she would most benefit from outpatient/residential substance use treatment to target historic substance induced mood disorder. Patient will be discharged with plan to pursue treatment at [facility] and additional community resources if needed. Patient in agreement with this plan.    Although this patient presented to the ED for chronic SI, she does not appear to be at imminent risk of dangerousness to self, dangerousness to others and serious withdrawal from detoxification at this time. While future psychiatric events cannot be accurately predicted, the patient does not necessitate nor desire further acute inpatient psychiatric care at this time. There is a possibility that she would benefit from an inpatient hospital stay to learn additional coping skills and provide structure and safety outside of the context of her psychosocial stressors; however, this must be weighed against the patient's denial of dangerousness and her lack of desire for voluntary admission. The patient weighed the risks and benefits of an inpatient admission and voiced understanding of the risks involved in returning to home/shelter. The patient responded well to supportive and problem solving therapeutic interventions.      While this patient presents with risk factors that include??current substance abuse, current treatment non-compliance, current diagnosis of depression, suicidal ideation or threats without a plan, chronic mental illness > 5 years, past substance abuse and past diagnosis of depression??recent victim of assaults, threats, or bullying ??and past substance abuse, these are mitigated by protective factors which include lack of active SI/HI, no know access to weapons or firearms, motivation for treatment, effective problem solving skills and presence of a safety plan with follow-up care.     A thorough psychiatric evaluation has been completed including evaluation of the patient, reviewing available medical/clinic records, evaluating her unique risk and protective factors, and discussing treatment recommendations.     Diagnoses:   Principal Problem:    Substance induced mood disorder  Active Problems:    Cocaine use disorder, severe    Alcohol use disorder, severe    HIV (human immunodeficiency virus infection)    r/o Borderline personality disorder     Stressors: chronic substance use, treatment non-compliance, lack of psychosocial resources     Plan   --  Disposition: The Psychiatry Emergency Service treatment team recommends discharge to home to pursue oupt substance use tx.  A phone number for the on-call mental health provider at [facility] was provided, in the event that urgent psychiatric concerns arise in the future.  The Psychiatry Emergency Service consultant recommends returning to the nearest ED or calling 911 if the patient develops any intent or plan of harming self or others.       -- Medication Recommendations: Follow recommendations of outpt providers.     -- Behavioral Recommendations: F/U with [facility] for substance use treatment.     -- Resources: Patient provided with community substance use resources and detailed instructions on how to access local shelters.    Thank you for this consult. Should you have any questions regarding the assessment, plan, or recommendations please contact the on-call psychiatry resident at pager # [phone].     TIME SPENT: 20 minutes    INTERVENTION: Risk assessment, Supportive/Problem solving psychotherapy, Psycho-education and MI.     Patient and plan of care were discussed with the Attending MD, Lynnea Maizes, who agrees with the above statement and plan.     Patient had recent CSSRS screening performed which rated them at Moderate. At this time we recommend Q 15 Minute Obs level of observation. This decision is based on my review the chart, interview of the patient, mental status examination, and consideration of suicide risk factors.     PES Attending Lynnea Maizes, MD was available for consultation.     Charlann Lange, LCSWA    Subjective:      Reason for consult: Safety evaluation    Per Triage: [name], RN 08/10/18 1949: Pt here for ETOH, pt also with a cough, unknown amount of time. Pt tearful on exam, dry heaving and cough. Pt also endorses SI x1 month w/ plan to cut her throat. Pt also endorses HI w/ plan to kill her ex boy friend with no specific plan. Pt not giving details on which substances she took today. Denies AH/VH.     Per EM Provider: Santa Genera, NP 08/11/18 [phone]: Key Cen is a 43 transfemale with hx of HIV (poor treatment adherence), polysubstance abuse, chronic pain, HTN, migraines, borderline personality disorder and malingering who presented to ED yesterday evening with c/o SI and HI while intoxicated. She initially was triaged in [facility] respiratory unit due to coughing/retching in triage. However was without respiratory symptoms on further evaluation. Additionally negative covid-19 and respiratory pathogen panel on 3/21.   ??  She has had multiple similar presentations within the last 7 days and housing and substance use does seem to be a significant contributor.    Pt Interview: Patient EMR reviewed and case discussed with unit RN. Prior to interview, pt observed lying in pt bed appropriately. On interview the patient reports complaints of wanting to end her life. She states that she needs inpt treatment due to this and because she had homicidal thoughts when she saw her an ex-partner recently. She also states that she needs to be in the hospital to restart her medications. Writer discussed possibility of restarting medications on outpt basis, but pt indicates that her non-compliance is a sign that she needs to be hospitalized despite managing to be continuously compliant with suboxone which indicates pt's ability to access medications independently. Although pt reports HI, she denies current plans to track down ex-partner and harm them as she does not know where they live and she denies attempting to actually  harm them while seeing them recently. In regard to SI, pt's complaints to nursing at triage contradict encounter with this writer on 3/21. Today she initially states that she is hopeless but during interview she indicates motivation to pursue substance use tx or an [facility]. Pt is capable of recognizing that she was at her best while in recovery and compliant with her medications. She indicates that this was when she was in treatment at [facility]. Writer discusses likelihood of this working for her again as she had success with it in the past and pt was capable of recognizing this. Writer discussed recommending discharge with substance use treatment resources.     Following initial evaluation, the patient individually contacted [facility] and scheduled phone interview for intake and was accepted for 3/26. Per CCM, pt will require prescriptions of medications started in the ED, but [facility] can manage pt's suboxone and will provide transportation on 3/26. This was explained to the pt who was in agreement with this plan.     COLLATERAL: No collateral available at this time.     Social History     Socioeconomic History   ??? Marital status: Single     Spouse name: Not on file   ??? Number of children: Not on file   ??? Years of education: Not on file   ??? Highest education level: Not on file   Occupational History   ??? Not on file   Social Needs   ??? Financial resource strain: Not on file   ??? Food insecurity     Worry: Not on file     Inability: Not on file   ??? Transportation needs     Medical: Not on file     Non-medical: Not on file   Tobacco Use   ??? Smoking status: Not on file   Substance and Sexual Activity   ??? Alcohol use: Not on file   ??? Drug use: Not on file   ??? Sexual activity: Not on file   Lifestyle   ??? Physical activity     Days per week: Not on file     Minutes per session: Not on file   ??? Stress: Not on file   Relationships   ??? Social Wellsite geologist on phone: Not on file     Gets together: Not on file     Attends religious service: Not on file     Active member of club or organization: Not on file     Attends meetings of clubs or organizations: Not on file     Relationship status: Not on file   Other Topics Concern   ??? Not on file   Social History Narrative   ??? Not on file       Objective:     VS:   Vital Signs  Temp: 36.6 ??C (97.8 ??F)  Temp Source: Oral  Heart Rate: 86  SpO2 Pulse: 80  Heart Rate Source: Monitor  Resp: 18  BP: 157/97  BP Location: Left arm  BP Method: Automatic  Patient Position: Sitting    Mental Status Exam:  Appearance:  ??  Well developed and Disheveled   Behavior: ??  Calm, cooperative, and direct eye-contact   Motor: ??  No abnormal movements   Speech/Language:  ??  Language intact, well formed   Mood: ??  Depressed   Affect: ??  Mood congruent    Thought process: ??  Logical, linear, clear, coherent, goal  directed   Thought content:   ??  Chronic SI and HI at baseline   Perceptual disturbances:   ??  Behaviors not concerning for RTIS  ??   Orientation: ??  Oriented to person, place, time, and general circumstances   Attention: ??  Able to fully attend without fluctuations in consciousness   Concentration: ??  Able to fully concentrate and attend   Memory: ??  Immediate, short-term, long-term, and recall grossly intact    Fund of knowledge:  ??  Consistent with level of education and development   Insight:   ??  Limited chronically   Judgment:  ??  Limited chronically    Impulse Control: ??  Limited chronically      Please refer to EM provider note for review of any labs, EKG or radiology studies.     SAFE-T Protocol with C-SSRS - Initial    Step 1: Identify Risk Factors    C-SSRS Suicidal Ideation Severity  1)Wish to be dead  Within the last month, have you wished you were dead or wished you could go to sleep and not wake up? Yes (08/10/18 1953)   2)Suicidal Thoughts  Within the last month, have you actually had any thoughts of killing yourself? Yes (08/10/18 1953)   3)Suicidal Thoughts with Method Without Specific Plan or Intent to Act  Within the last month, have you been thinking about how you might kill yourself? Yes (08/10/18 1953)   4)Suicidal Intent Without Specific Plan  Within the last month, have you had these thoughts and had some intention of acting on them?  Yes (08/10/18 1953)   5)Suicide Intent with Specific Plan  Within the last month, have you started to work out or worked out the details of how to kill yourself? Do you intend to carry out this plan? Yes (08/10/18 1953)   6) Suicide Behavior Question  Within your lifetime, have you ever done anything, started to do anything, or prepared to do anything to end your life?  Yes (08/10/18 1953)      Lifetime Past 3 Months   How long ago did you do any of these?                Within the last three months     First Initial Risk Level High Risk (08/10/18 1953)     ??  Current and Past Psychiatric Dx:??  Mood Disorder  Cluster B Personality disorders or traits (I.e., Borderline, Antisocial, Histrionic & Narcissistic) Family History:??  Suicide   Presenting Symptoms:??  Chronic impulsivity Precipitants/Stressors:  Triggering events leading to humiliation, shame, and/or despair (e.g. Loss of relationship, financial or health status) (real or anticipated)  Substance intoxication or withdrawal   ?? Change in treatment C SSRS:  Non-compliant or not receiving treatment   ??  Access to lethal methods: Do you currently have a firearm in your home or easily accessible? No known  ??  Step 2: Identify Protective Factors   (Protective factors may not counteract significant acute suicide risk factors)  ??  Internal:  None identified 2/2 pt factors External:  None identified 2/2 pt factors   ??  Step 3: Specific questioning about Thoughts, Plans, and Suicidal Intent  (See Step 1 for Ideation Severity and Behavior)  ??  C-SSRS Suicidal Ideation Intensity  Frequency   ??   How many times have you had these thoughts?  ?? Chronically   Duration ??   When you have the thoughts how long do they last Unable to assess   Controllability ??   Could/can you stop thinking about killing yourself or wanting to die if you want to die?  ?? Can control thoughts with little difficulty   Deterrents ??   Are there things - anyone or anything (e.g. Family, religion, pain of death) - that stopped you from wanting to die or acting on thoughts of suicide?  ?? Deterrents definitely stopped you from attempting suicide   Reason for Ideation ??   What sort of reasons did you have for thinking about wanting to die or killing yourself?  Was it to end the pain or stop he way you were feeling (in other words you couldn't go on living with this pain or how you were feeling) or was it to get attention, revenge or a reaction from others? Or both? Mostly to get attention, revenge, or a reaction from others   ??  ??  Step 4: Guidelines to Determine Level of Risk and Develop Interventions to LOWER Risk Level  The estimation of suicide risk, at the culmination of the suicide assessment, is the quintessential clinical judgement, since no study has identified one specific risk factor or set of risk factors as specifically predictive of suicide or other suicidal behavior.  From The American Psychiatric Practice Guidelines for the Assessment and Treatment of Patients with Suicidal Behaviors, page 24.  ??  Risk Stratification based on my safety assessment TRIAGE/Interventions   Low Suicide Risk Follow Facility Policy   ??  Step 5: Documentation  ??  Clinical Observation and Risk Level:??Based on my clinical assessment of LANIS STORLIE, I believe she represents a??Low Suicide Risk in the current clinical setting.  ??  Clinical Note  ??  Relevant Mental Status Information:   See MSE above.  ??  Methods of??Suicide??Risk Evaluation:??I have reviewed the chart, interviewed her and asked about ideation, intent, plan, and suicidal behaviors (step three), completed a mental status examination, asked about the presence of firearms, and taken into consideration the above??suicide??risk (step one) and protective factors (step two) as I completed my overall risk assessment.  ??  Brief Evaluation Summary  ??  Collateral Sources Used and Relevant Information Obtained: the patient  Specific Assessment Data to Support Risk Determination: See methods of??suicide??risk evaluation as documented above.  Rationale for Actions Taken and Not Taken: The patient was scored as High Risk (08/06/18 0417) on the initial [place]??Suicide??Severity Rating done by the nursing staff. It is my clinical judgment that her observation level should be maintained at q15 min checks at this time. This will be reassessed if there is a clinically significant change in the status of the patient. This judgment is based on our ability to directly address??suicide??risk, implement??suicide??prevention strategies and develop a safety plan while she is in the clinical setting.

## 2018-08-11 NOTE — Unmapped (Signed)
Pt received breakfast. Still asleep in bed. PNA remains at bedside. Will continue to monitor closely.

## 2018-08-11 NOTE — Unmapped (Signed)
ED Progress Note    August 11, 2018 9:51 AM    Ryan Robinson is a 48 transfemale with hx of HIV (poor treatment adherence), polysubstance abuse, chronic pain, HTN, migraines, borderline personality disorder and malingering who presented to ED yesterday evening with c/o SI and HI while intoxicated. She initially was triaged in [facility] respiratory unit due to coughing/retching in triage. However was without respiratory symptoms on further evaluation. Additionally negative covid-19 and respiratory pathogen panel on 3/21.     She has had multiple similar presentations within the last 7 days and housing and substance use does seem to be a significant contributor.    O  Constitutional: Alert and oriented, very anxious appearing  Eyes: Conjunctivae are normal. Pupils equal and reactive.   ENT       Head: Normocephalic and atraumatic.       Nose: No epistaxis.       Mouth/Throat: Mucous membranes are moist.       Neck: No stridor. No nuchal rigidity.  Hematological/Lymphatic/Immunilogical: No cervical lymphadenopathy.  Cardiovascular: Regular rate, regular rhythm. Normal and symmetric distal pulses are present in bilateral upper extremities.  Respiratory: Normal respiratory effort. Breath sounds are clear to auscultation and equal bilaterally.  Gastrointestinal: Soft and nontender. There is no rebound tenderness, guarding or rigidity.  Musculoskeletal: Nontender with normal range of motion in all extremities.   Right lower leg: No tenderness or edema.   Left lower leg: No tenderness or edema.  Neurologic:  No gross focal neurologic deficits are appreciated.  Skin: Skin is warm, dry and intact. No rash noted.  Psychiatric:  tearful      A/P  - HIV-   I have had several conversations with her personally about importance of adherence to ART. On at least 2 occasions in the last year I have restarted her ART after period of non-compliance to only have her self discontinue shortly after. Last CD4 142, viral load 15,392 on 08/03/18 with referral placed to ID at that time.     She is currently without symptoms of opportunistic infection. Despite poor numbers, negative CXR on 08/06/18. Will start bactrim prophylaxis and again have encouraged her to re-establish with her ID provider and resume ART.    I will not restart her ART in the ED at this time given her history of only taking while in ED as I do not want to promote resistance.    - HTN- Resumed HCTZ 25mg  QD  - BPH- Resume home flomax  - Chronic pain- resumed gabapenint 800mg  TID, Suboxone 8mg  BID, 4mg  QHS

## 2018-08-11 NOTE — Unmapped (Addendum)
In addition to the treatment resources provided at the beginning of this After Visit Summary, you may wish to call   [facility] / [address], [place], [place] [address] [phone] for a short screening assessment.  If appropriate for their program, they will then put you in touch with the admitting team.      Resources for Shelter:     [facility]:  Call them to begin process of locating shelter/housing.  [phone]  Due to COVID-19, [facility] is not accepting new guests in either shelter.  [facility] remains open with boxed meals to go.     [facility]:  Must [employer] Intake to complete a phone interview and be placed on their priority list.  [phone]     [facility]:  Call them directly:  [phone]    [facility]:   [phone]   Website states due to COVID-19: Under the guidance from the [facility] the [facility] they are NOT accepting new participants or overnight guests at either campus. They are working with the [place] and [place] health departments to set up an overnight detox facility off-property.

## 2018-08-12 MED ORDER — GABAPENTIN 300 MG CAPSULE
ORAL_CAPSULE | Freq: Three times a day (TID) | ORAL | 0 refills | 0 days | Status: CP
Start: 2018-08-12 — End: 2018-08-26

## 2018-08-12 MED ORDER — TAMSULOSIN 0.4 MG CAPSULE
ORAL_CAPSULE | Freq: Every day | ORAL | 0 refills | 0 days | Status: CP
Start: 2018-08-12 — End: 2018-08-26

## 2018-08-12 MED ORDER — SPIRONOLACTONE 100 MG TABLET
ORAL_TABLET | Freq: Two times a day (BID) | ORAL | 0 refills | 0 days | Status: CP
Start: 2018-08-12 — End: 2018-08-26

## 2018-08-12 MED ORDER — ESTRADIOL 2 MG TABLET
ORAL_TABLET | Freq: Two times a day (BID) | ORAL | 0 refills | 0 days | Status: CP
Start: 2018-08-12 — End: 2018-08-26

## 2018-08-12 NOTE — Unmapped (Signed)
Patient given confirmation number and password (Pts last name) for bus ticket to [facility]. Patient needs to be discharged tomorrow at 8-830 AM to make it to bus station by 930 AM.

## 2018-08-12 NOTE — Unmapped (Signed)
Patient given taxi voucher and bus ticket confirmation. Discharge instructions reviewed with patient and prescriptions given - patient denied any questions. All personal belongings were returned. At the time of discharge patient was alert and oriented, calm, and without any distress.

## 2018-08-12 NOTE — Unmapped (Signed)
[place] police on unit to return patients clothes.

## 2018-08-12 NOTE — Unmapped (Signed)
Patient received breakfast tray 

## 2018-09-26 NOTE — Unmapped (Signed)
This SW attempted to call STEWART SASAKI to schedule an appt in clinic, as she is out of care. This SW left a discreet VM requesting a return phone call. This SW will also copy Sherilynn Phen on this note so she can follow up with [employer].    Duration of intervention: 5 minutes      Anubis Fundora, LCSWA, CCM

## 2018-12-13 NOTE — Unmapped (Signed)
Needs to be re-routed to his PCP. I am an ED provider.

## 2019-03-03 NOTE — Unmapped (Signed)
This was filled a year ago in the inpatient setting. I am not comfortable filling this medication without follow up or a physical exam.

## 2019-03-03 NOTE — Unmapped (Signed)
Attempted to call pt to inform them I am unable to fill flomax Rx without patient following up. This should be done by a PCP and not psychiatry. Was unable to leave a voicemail.

## 2019-03-26 NOTE — Unmapped (Signed)
This patient has been disenrolled from the [facility] specialty pharmacy services due to inability of the pharmacy to reach the patient.    Roderic Palau  [facility] Specialty Pharmacist
